# Patient Record
Sex: Female | Born: 1995 | Hispanic: No | Smoking: Former smoker
Health system: Southern US, Community
[De-identification: ages and names within clinical notes are randomized; demographics above are authoritative.]

## PROBLEM LIST (undated history)

## (undated) ENCOUNTER — Inpatient Hospital Stay (HOSPITAL_COMMUNITY): Payer: Self-pay

## (undated) DIAGNOSIS — J189 Pneumonia, unspecified organism: Secondary | ICD-10-CM

## (undated) DIAGNOSIS — R011 Cardiac murmur, unspecified: Secondary | ICD-10-CM

## (undated) DIAGNOSIS — I499 Cardiac arrhythmia, unspecified: Secondary | ICD-10-CM

## (undated) DIAGNOSIS — J302 Other seasonal allergic rhinitis: Secondary | ICD-10-CM

## (undated) HISTORY — PX: TYMPANOSTOMY TUBE PLACEMENT: SHX32

---
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
## 2014-01-04 ENCOUNTER — Ambulatory Visit (HOSPITAL_COMMUNITY): Payer: Medicaid Other

## 2014-01-11 ENCOUNTER — Ambulatory Visit (HOSPITAL_COMMUNITY)
Admission: RE | Admit: 2014-01-11 | Discharge: 2014-01-11 | Disposition: A | Discharge status: Home / Self Care | Payer: Medicaid Other | Source: Ambulatory Visit | Attending: Nurse Practitioner | Admitting: Nurse Practitioner

## 2014-01-11 VITALS — BP 114/58 | HR 97 | Wt 229.5 lb

## 2014-01-11 DIAGNOSIS — E669 Obesity, unspecified: Secondary | ICD-10-CM | POA: Insufficient documentation

## 2014-01-11 DIAGNOSIS — O289 Unspecified abnormal findings on antenatal screening of mother: Secondary | ICD-10-CM | POA: Diagnosis not present

## 2014-01-11 DIAGNOSIS — O350XX0 Maternal care for (suspected) central nervous system malformation in fetus, not applicable or unspecified: Secondary | ICD-10-CM | POA: Diagnosis not present

## 2014-01-11 DIAGNOSIS — O99210 Obesity complicating pregnancy, unspecified trimester: Secondary | ICD-10-CM

## 2014-01-11 DIAGNOSIS — Z3400 Encounter for supervision of normal first pregnancy, unspecified trimester: Secondary | ICD-10-CM

## 2014-01-11 DIAGNOSIS — O3500X0 Maternal care for (suspected) central nervous system malformation or damage in fetus, unspecified, not applicable or unspecified: Secondary | ICD-10-CM | POA: Insufficient documentation

## 2014-01-11 DIAGNOSIS — O280 Abnormal hematological finding on antenatal screening of mother: Secondary | ICD-10-CM

## 2014-01-25 ENCOUNTER — Encounter: Payer: Self-pay | Admitting: Obstetrics and Gynecology

## 2014-01-25 ENCOUNTER — Ambulatory Visit (INDEPENDENT_AMBULATORY_CARE_PROVIDER_SITE_OTHER): Payer: Medicaid Other | Admitting: Obstetrics and Gynecology

## 2014-01-25 VITALS — BP 109/66 | HR 100 | Temp 97.3°F | Wt 226.1 lb

## 2014-01-25 DIAGNOSIS — O4692 Antepartum hemorrhage, unspecified, second trimester: Secondary | ICD-10-CM | POA: Insufficient documentation

## 2014-01-25 DIAGNOSIS — O4690 Antepartum hemorrhage, unspecified, unspecified trimester: Secondary | ICD-10-CM

## 2014-01-25 NOTE — Patient Instructions (Signed)

## 2014-01-25 NOTE — Progress Notes (Signed)
Mild edema in feet.

## 2014-01-25 NOTE — Progress Notes (Signed)
US to F/U anatomy> scheduled for July. Hemorrhoids - asymptomatic and not constipated. Discussed and information given.

## 2014-01-26 NOTE — Addendum Note (Signed)
Encounter addended by: Danae Orleanseirdre C Poe, CNM on: 01/26/2014  5:04 PM<BR>     Documentation filed: Problem List

## 2014-02-22 ENCOUNTER — Ambulatory Visit (INDEPENDENT_AMBULATORY_CARE_PROVIDER_SITE_OTHER): Payer: Medicaid Other | Admitting: Obstetrics and Gynecology

## 2014-02-22 ENCOUNTER — Encounter: Payer: Self-pay | Admitting: Student

## 2014-02-22 VITALS — BP 119/75 | HR 105 | Temp 97.5°F | Wt 230.4 lb

## 2014-02-22 DIAGNOSIS — Z3400 Encounter for supervision of normal first pregnancy, unspecified trimester: Secondary | ICD-10-CM

## 2014-02-22 DIAGNOSIS — Z3402 Encounter for supervision of normal first pregnancy, second trimester: Secondary | ICD-10-CM

## 2014-02-22 DIAGNOSIS — Z23 Encounter for immunization: Secondary | ICD-10-CM

## 2014-02-22 LAB — POCT URINALYSIS DIP (DEVICE)
BILIRUBIN URINE: NEGATIVE
Bilirubin Urine: NEGATIVE
Glucose, UA: NEGATIVE mg/dL
Glucose, UA: NEGATIVE mg/dL
Hgb urine dipstick: NEGATIVE
Hgb urine dipstick: NEGATIVE
Ketones, ur: NEGATIVE mg/dL
LEUKOCYTES UA: NEGATIVE
NITRITE: NEGATIVE
Nitrite: NEGATIVE
PH: 6 (ref 5.0–8.0)
PH: 6 (ref 5.0–8.0)
PROTEIN: 30 mg/dL — AB
PROTEIN: 30 mg/dL — AB
Specific Gravity, Urine: 1.025 (ref 1.005–1.030)
Specific Gravity, Urine: 1.025 (ref 1.005–1.030)
UROBILINOGEN UA: 1 mg/dL (ref 0.0–1.0)
Urobilinogen, UA: 1 mg/dL (ref 0.0–1.0)

## 2014-02-22 LAB — CBC
HCT: 31.4 % — ABNORMAL LOW (ref 36.0–46.0)
Hemoglobin: 10.9 g/dL — ABNORMAL LOW (ref 12.0–15.0)
MCH: 28.9 pg (ref 26.0–34.0)
MCHC: 34.7 g/dL (ref 30.0–36.0)
MCV: 83.3 fL (ref 78.0–100.0)
PLATELETS: 226 10*3/uL (ref 150–400)
RBC: 3.77 MIL/uL — AB (ref 3.87–5.11)
RDW: 14.7 % (ref 11.5–15.5)
WBC: 10.6 10*3/uL — ABNORMAL HIGH (ref 4.0–10.5)

## 2014-02-22 LAB — RPR

## 2014-02-22 MED ORDER — TETANUS-DIPHTH-ACELL PERTUSSIS 5-2.5-18.5 LF-MCG/0.5 IM SUSP: 0.5000 mL | Freq: Once | INTRAMUSCULAR | Status: DC

## 2014-02-22 NOTE — Progress Notes (Signed)
Doing well. Obese and S>D. Has US scheduled for 7/29 for F/U anatomy and interval growth. 28 wk labs and tDap today. Has started PN classes. Mild RLP.

## 2014-02-22 NOTE — Patient Instructions (Signed)
Second Trimester of Pregnancy The second trimester is from week 13 through week 28, months 4 through 6. The second trimester is often a time when you feel your best. Your body has also adjusted to being pregnant, and you begin to feel better physically. Usually, morning sickness has lessened or quit completely, you may have more energy, and you may have an increase in appetite. The second trimester is also a time when the fetus is growing rapidly. At the end of the sixth month, the fetus is about 9 inches long and weighs about 1 pounds. You will likely begin to feel the baby move (quickening) between 18 and 20 weeks of the pregnancy. BODY CHANGES Your body goes through many changes during pregnancy. The changes vary from woman to woman.   Your weight will continue to increase. You will notice your lower abdomen bulging out.  You may begin to get stretch marks on your hips, abdomen, and breasts.  You may develop headaches that can be relieved by medicines approved by your health care provider.  You may urinate more often because the fetus is pressing on your bladder.  You may develop or continue to have heartburn as a result of your pregnancy.  You may develop constipation because certain hormones are causing the muscles that push waste through your intestines to slow down.  You may develop hemorrhoids or swollen, bulging veins (varicose veins).  You may have back pain because of the weight gain and pregnancy hormones relaxing your joints between the bones in your pelvis and as a result of a shift in weight and the muscles that support your balance.  Your breasts will continue to grow and be tender.  Your gums may bleed and may be sensitive to brushing and flossing.  Dark spots or blotches (chloasma, mask of pregnancy) may develop on your face. This will likely fade after the baby is born.  A dark line from your belly button to the pubic area (linea nigra) may appear. This will likely fade  after the baby is born.  You may have changes in your hair. These can include thickening of your hair, rapid growth, and changes in texture. Some women also have hair loss during or after pregnancy, or hair that feels dry or thin. Your hair will most likely return to normal after your baby is born. WHAT TO EXPECT AT YOUR PRENATAL VISITS During a routine prenatal visit:  You will be weighed to make sure you and the fetus are growing normally.  Your blood pressure will be taken.  Your abdomen will be measured to track your baby's growth.  The fetal heartbeat will be listened to.  Any test results from the previous visit will be discussed. Your health care provider may ask you:  How you are feeling.  If you are feeling the baby move.  If you have had any abnormal symptoms, such as leaking fluid, bleeding, severe headaches, or abdominal cramping.  If you have any questions. Other tests that may be performed during your second trimester include:  Blood tests that check for:  Low iron levels (anemia).  Gestational diabetes (between 24 and 28 weeks).  Rh antibodies.  Urine tests to check for infections, diabetes, or protein in the urine.  An ultrasound to confirm the proper growth and development of the baby.  An amniocentesis to check for possible genetic problems.  Fetal screens for spina bifida and Down syndrome. HOME CARE INSTRUCTIONS   Avoid all smoking, herbs, alcohol, and unprescribed   drugs. These chemicals affect the formation and growth of the baby.  Follow your health care provider's instructions regarding medicine use. There are medicines that are either safe or unsafe to take during pregnancy.  Exercise only as directed by your health care provider. Experiencing uterine cramps is a good sign to stop exercising.  Continue to eat regular, healthy meals.  Wear a good support bra for breast tenderness.  Do not use hot tubs, steam rooms, or saunas.  Wear your  seat belt at all times when driving.  Avoid raw meat, uncooked cheese, cat litter boxes, and soil used by cats. These carry germs that can cause birth defects in the baby.  Take your prenatal vitamins.  Try taking a stool softener (if your health care provider approves) if you develop constipation. Eat more high-fiber foods, such as fresh vegetables or fruit and whole grains. Drink plenty of fluids to keep your urine clear or pale yellow.  Take warm sitz baths to soothe any pain or discomfort caused by hemorrhoids. Use hemorrhoid cream if your health care provider approves.  If you develop varicose veins, wear support hose. Elevate your feet for 15 minutes, 3-4 times a day. Limit salt in your diet.  Avoid heavy lifting, wear low heel shoes, and practice good posture.  Rest with your legs elevated if you have leg cramps or low back pain.  Visit your dentist if you have not gone yet during your pregnancy. Use a soft toothbrush to brush your teeth and be gentle when you floss.  A sexual relationship may be continued unless your health care provider directs you otherwise.  Continue to go to all your prenatal visits as directed by your health care provider. SEEK MEDICAL CARE IF:   You have dizziness.  You have mild pelvic cramps, pelvic pressure, or nagging pain in the abdominal area.  You have persistent nausea, vomiting, or diarrhea.  You have a bad smelling vaginal discharge.  You have pain with urination. SEEK IMMEDIATE MEDICAL CARE IF:   You have a fever.  You are leaking fluid from your vagina.  You have spotting or bleeding from your vagina.  You have severe abdominal cramping or pain.  You have rapid weight gain or loss.  You have shortness of breath with chest pain.  You notice sudden or extreme swelling of your face, hands, ankles, feet, or legs.  You have not felt your baby move in over an hour.  You have severe headaches that do not go away with  medicine.  You have vision changes. Document Released: 07/22/2001 Document Revised: 08/02/2013 Document Reviewed: 09/28/2012 [employer] Patient Information 2015 [employer]. This information is not intended to replace advice given to you by your health care provider. Make sure you discuss any questions you have with your health care provider.  

## 2014-02-22 NOTE — Progress Notes (Signed)
Pressure-when moving 28 week labs and given packet

## 2014-02-23 LAB — HIV ANTIBODY (ROUTINE TESTING W REFLEX): HIV 1&2 Ab, 4th Generation: NONREACTIVE

## 2014-02-23 LAB — GLUCOSE TOLERANCE, 1 HOUR (50G) W/O FASTING: Glucose, 1 Hour GTT: 93 mg/dL (ref 70–140)

## 2014-03-08 ENCOUNTER — Encounter (HOSPITAL_COMMUNITY): Payer: Self-pay

## 2014-03-08 ENCOUNTER — Ambulatory Visit (HOSPITAL_COMMUNITY)
Admission: RE | Admit: 2014-03-08 | Discharge: 2014-03-08 | Disposition: A | Discharge status: Home / Self Care | Payer: Medicaid Other | Source: Ambulatory Visit | Attending: Advanced Practice Midwife | Admitting: Advanced Practice Midwife

## 2014-03-08 DIAGNOSIS — O99210 Obesity complicating pregnancy, unspecified trimester: Secondary | ICD-10-CM

## 2014-03-08 DIAGNOSIS — O289 Unspecified abnormal findings on antenatal screening of mother: Secondary | ICD-10-CM

## 2014-03-08 DIAGNOSIS — E669 Obesity, unspecified: Secondary | ICD-10-CM | POA: Insufficient documentation

## 2014-03-08 DIAGNOSIS — Z3689 Encounter for other specified antenatal screening: Secondary | ICD-10-CM | POA: Diagnosis not present

## 2014-03-09 ENCOUNTER — Ambulatory Visit (INDEPENDENT_AMBULATORY_CARE_PROVIDER_SITE_OTHER): Payer: Medicaid Other | Admitting: Obstetrics and Gynecology

## 2014-03-09 VITALS — BP 99/52 | HR 106 | Wt 233.7 lb

## 2014-03-09 DIAGNOSIS — O99210 Obesity complicating pregnancy, unspecified trimester: Secondary | ICD-10-CM

## 2014-03-09 DIAGNOSIS — E669 Obesity, unspecified: Secondary | ICD-10-CM

## 2014-03-09 DIAGNOSIS — R799 Abnormal finding of blood chemistry, unspecified: Secondary | ICD-10-CM

## 2014-03-09 DIAGNOSIS — R772 Abnormality of alphafetoprotein: Secondary | ICD-10-CM

## 2014-03-09 LAB — POCT URINALYSIS DIP (DEVICE)
Bilirubin Urine: NEGATIVE
GLUCOSE, UA: NEGATIVE mg/dL
Hgb urine dipstick: NEGATIVE
Ketones, ur: NEGATIVE mg/dL
LEUKOCYTES UA: NEGATIVE
NITRITE: NEGATIVE
PROTEIN: NEGATIVE mg/dL
Specific Gravity, Urine: 1.015 (ref 1.005–1.030)
UROBILINOGEN UA: 0.2 mg/dL (ref 0.0–1.0)
pH: 8.5 — ABNORMAL HIGH (ref 5.0–8.0)

## 2014-03-09 NOTE — Progress Notes (Signed)
US: 44th at 30 wks, AFI 21.5. Has F/U scheduled 9/23. RLP discussed. Plans Nexplanon, breastfeed.

## 2014-03-09 NOTE — Patient Instructions (Signed)

## 2014-03-17 ENCOUNTER — Encounter (HOSPITAL_COMMUNITY): Payer: Self-pay | Admitting: *Deleted

## 2014-03-17 ENCOUNTER — Inpatient Hospital Stay (HOSPITAL_COMMUNITY)
Admission: AD | Admit: 2014-03-17 | Discharge: 2014-03-18 | Disposition: A | Discharge status: Home / Self Care | Payer: Medicaid Other | Source: Ambulatory Visit | Attending: Obstetrics & Gynecology | Admitting: Obstetrics & Gynecology

## 2014-03-17 DIAGNOSIS — Z3402 Encounter for supervision of normal first pregnancy, second trimester: Secondary | ICD-10-CM

## 2014-03-17 DIAGNOSIS — O368131 Decreased fetal movements, third trimester, fetus 1: Secondary | ICD-10-CM

## 2014-03-17 DIAGNOSIS — O368190 Decreased fetal movements, unspecified trimester, not applicable or unspecified: Secondary | ICD-10-CM | POA: Insufficient documentation

## 2014-03-17 DIAGNOSIS — Z87891 Personal history of nicotine dependence: Secondary | ICD-10-CM | POA: Insufficient documentation

## 2014-03-17 NOTE — MAU Note (Signed)
Pt reports no fetal movement since this am.  

## 2014-03-18 DIAGNOSIS — Z87891 Personal history of nicotine dependence: Secondary | ICD-10-CM | POA: Diagnosis not present

## 2014-03-18 DIAGNOSIS — O368190 Decreased fetal movements, unspecified trimester, not applicable or unspecified: Secondary | ICD-10-CM

## 2014-03-18 NOTE — MAU Provider Note (Signed)
  History     CSN: [id number]  Arrival date and time: 03/17/14 2341   None     Chief Complaint  Patient presents with  . Decreased Fetal Movement   HPI  18 year old G2P0010 at 11059w2d gestation who presents for reduced fetal movement. She is used to fetal movement every hour. At 2130 hours she noticed that baby was not moving much and came to the hospital after 2 hours of no movement. No vaginal bleeding, no discharge, no leakage of fluid. No contractions. She reports that she feels baby moving now that she is on the monitor. No other complaints    Past Medical History  Diagnosis Date  . Seasonal allergies   . Arrhythmia     Past Surgical History  Procedure Laterality Date  . Tympanostomy tube placement      Family History  Problem Relation Age of Onset  . Diabetes Maternal Grandfather   . Cancer Paternal Grandmother   . Diabetes Paternal Grandfather     History  Substance Use Topics  . Smoking status: Former Smoker    Types: Cigarettes    Quit date: 11/02/2010  . Smokeless tobacco: Never Used  . Alcohol Use: No    Allergies: No Known Allergies  Facility-administered medications prior to admission  Medication Dose Route Frequency Provider Last Rate Last Dose  . Tdap (BOOSTRIX) injection 0.5 mL  0.5 mL Intramuscular Once Danae Orleanseirdre C Poe, CNM       Prescriptions prior to admission  Medication Sig Dispense Refill  . Prenatal Vit-Fe Fumarate-FA (PRENATAL MULTIVITAMIN) TABS Take 1 tablet by mouth every morning.      . Doxylamine-Pyridoxine 10-10 MG TBEC Take 1 tablet by mouth 2 (two) times daily.  60 tablet  1    Review of Systems  Constitutional: Negative for fever and chills.  HENT: Negative for hearing loss.   Eyes: Negative for blurred vision and double vision.  Respiratory: Negative for cough and shortness of breath.   Cardiovascular: Negative for chest pain and palpitations.  Gastrointestinal: Negative for heartburn and nausea.  Genitourinary: Negative for  dysuria and urgency.  Musculoskeletal: Negative for myalgias.  Skin: Negative for itching and rash.  Neurological: Negative for dizziness, tingling and headaches.   Physical Exam   Last menstrual period 08/18/2013.  Physical Exam  Constitutional: She is oriented to person, place, and time. She appears well-developed and well-nourished.  Cardiovascular: Normal rate and regular rhythm.   Respiratory: Breath sounds normal.  GI: Soft. Bowel sounds are normal.  Gravid, non-tender  Musculoskeletal: She exhibits no edema.  Neurological: She is alert and oriented to person, place, and time.  Skin: Skin is warm.  Psychiatric: She has a normal mood and affect. Her behavior is normal.    MAU Course  Procedures  MDM Fetal monitoring to check for reactivity.  Assessment and Plan  18 year old at 4659w2d gestation with decreased fetal movement, now resolved. FHR reactive, no contractions, no signs of labor. Discharge home with instructions to count kicks and return if any signs of labor or loss of movement.  Patient evaluated with Francene FindersKimberly Damichael Hofman  Stephens, Devin A 03/18/2014, 12:11 AM   I have participated in the care of this patient and I agree with the above. Cam HaiSHAW, Cedric Mcclaine CNM 8:49 AM 03/18/2014

## 2014-03-18 NOTE — MAU Note (Signed)
Audible fetal movement while RN at bedside and pt states she feels the baby moving.

## 2014-03-18 NOTE — Discharge Instructions (Signed)

## 2014-03-19 NOTE — MAU Provider Note (Signed)
Attestation of Attending Supervision of Advanced Practitioner (CNM/NP): Evaluation and management procedures were performed by the Advanced Practitioner under my supervision and collaboration. I have reviewed the Advanced Practitioner's note and chart, and I agree with the management and plan.  Anterio Scheel H. 11:13 AM

## 2014-03-29 ENCOUNTER — Ambulatory Visit (INDEPENDENT_AMBULATORY_CARE_PROVIDER_SITE_OTHER): Payer: Medicaid Other | Admitting: Advanced Practice Midwife

## 2014-03-29 ENCOUNTER — Encounter: Payer: Self-pay | Admitting: Advanced Practice Midwife

## 2014-03-29 VITALS — BP 96/58 | HR 97 | Wt 236.3 lb

## 2014-03-29 DIAGNOSIS — Z3400 Encounter for supervision of normal first pregnancy, unspecified trimester: Secondary | ICD-10-CM

## 2014-03-29 DIAGNOSIS — Z3403 Encounter for supervision of normal first pregnancy, third trimester: Secondary | ICD-10-CM

## 2014-03-29 LAB — POCT URINALYSIS DIP (DEVICE)
BILIRUBIN URINE: NEGATIVE
GLUCOSE, UA: NEGATIVE mg/dL
Hgb urine dipstick: NEGATIVE
Ketones, ur: NEGATIVE mg/dL
LEUKOCYTES UA: NEGATIVE
NITRITE: NEGATIVE
Protein, ur: NEGATIVE mg/dL
Specific Gravity, Urine: 1.01 (ref 1.005–1.030)
Urobilinogen, UA: 0.2 mg/dL (ref 0.0–1.0)
pH: 7 (ref 5.0–8.0)

## 2014-03-29 NOTE — Patient Instructions (Signed)
Benadryl topical or pills.   Rash A rash is a change in the color or texture of your skin. There are many different types of rashes. You may have other problems that accompany your rash. CAUSES   Infections.  Allergic reactions. This can include allergies to pets or foods.  Certain medicines.  Exposure to certain chemicals, soaps, or cosmetics.  Heat.  Exposure to poisonous plants.  Tumors, both cancerous and noncancerous. SYMPTOMS   Redness.  Scaly skin.  Itchy skin.  Dry or cracked skin.  Bumps.  Blisters.  Pain. DIAGNOSIS  Your caregiver may do a physical exam to determine what type of rash you have. A skin sample (biopsy) may be taken and examined under a microscope. TREATMENT  Treatment depends on the type of rash you have. Your caregiver may prescribe certain medicines. For serious conditions, you may need to see a skin doctor (dermatologist). HOME CARE INSTRUCTIONS   Avoid the substance that caused your rash.  Do not scratch your rash. This can cause infection.  You may take cool baths to help stop itching.  Only take over-the-counter or prescription medicines as directed by your caregiver.  Keep all follow-up appointments as directed by your caregiver. SEEK IMMEDIATE MEDICAL CARE IF:  You have increasing pain, swelling, or redness.  You have a fever.  You have new or severe symptoms.  You have body aches, diarrhea, or vomiting.  Your rash is not better after 3 days. MAKE SURE YOU:  Understand these instructions.  Will watch your condition.  Will get help right away if you are not doing well or get worse. Document Released: 07/18/2002 Document Revised: 10/20/2011 Document Reviewed: 05/12/2011 [facility] Patient Information 2015 [place], [place]. This information is not intended to replace advice given to you by your health care provider. Make sure you discuss any questions you have with your health care provider.  Third Trimester of  Pregnancy The third trimester is from week 29 through week 42, months 7 through 9. The third trimester is a time when the fetus is growing rapidly. At the end of the ninth month, the fetus is about 20 inches in length and weighs 6-10 pounds.  BODY CHANGES Your body goes through many changes during pregnancy. The changes vary from woman to woman.   Your weight will continue to increase. You can expect to gain 25-35 pounds (11-16 kg) by the end of the pregnancy.  You may begin to get stretch marks on your hips, abdomen, and breasts.  You may urinate more often because the fetus is moving lower into your pelvis and pressing on your bladder.  You may develop or continue to have heartburn as a result of your pregnancy.  You may develop constipation because certain hormones are causing the muscles that push waste through your intestines to slow down.  You may develop hemorrhoids or swollen, bulging veins (varicose veins).  You may have pelvic pain because of the weight gain and pregnancy hormones relaxing your joints between the bones in your pelvis. Backaches may result from overexertion of the muscles supporting your posture.  You may have changes in your hair. These can include thickening of your hair, rapid growth, and changes in texture. Some women also have hair loss during or after pregnancy, or hair that feels dry or thin. Your hair will most likely return to normal after your baby is born.  Your breasts will continue to grow and be tender. A yellow discharge may leak from your breasts called colostrum.  Your belly button may stick out.  You may feel short of breath because of your expanding uterus.  You may notice the fetus "dropping," or moving lower in your abdomen.  You may have a bloody mucus discharge. This usually occurs a few days to a week before labor begins.  Your cervix becomes thin and soft (effaced) near your due date. WHAT TO EXPECT AT YOUR PRENATAL EXAMS  You will  have prenatal exams every 2 weeks until week 36. Then, you will have weekly prenatal exams. During a routine prenatal visit:  You will be weighed to make sure you and the fetus are growing normally.  Your blood pressure is taken.  Your abdomen will be measured to track your baby's growth.  The fetal heartbeat will be listened to.  Any test results from the previous visit will be discussed.  You may have a cervical check near your due date to see if you have effaced. At around 36 weeks, your caregiver will check your cervix. At the same time, your caregiver will also perform a test on the secretions of the vaginal tissue. This test is to determine if a type of bacteria, Group B streptococcus, is present. Your caregiver will explain this further. Your caregiver may ask you:  What your birth plan is.  How you are feeling.  If you are feeling the baby move.  If you have had any abnormal symptoms, such as leaking fluid, bleeding, severe headaches, or abdominal cramping.  If you have any questions. Other tests or screenings that may be performed during your third trimester include:  Blood tests that check for low iron levels (anemia).  Fetal testing to check the health, activity level, and growth of the fetus. Testing is done if you have certain medical conditions or if there are problems during the pregnancy. FALSE LABOR You may feel small, irregular contractions that eventually go away. These are called Braxton Hicks contractions, or false labor. Contractions may last for hours, days, or even weeks before true labor sets in. If contractions come at regular intervals, intensify, or become painful, it is best to be seen by your caregiver.  SIGNS OF LABOR   Menstrual-like cramps.  Contractions that are 5 minutes apart or less.  Contractions that start on the top of the uterus and spread down to the lower abdomen and back.  A sense of increased pelvic pressure or back pain.  A  watery or bloody mucus discharge that comes from the vagina. If you have any of these signs before the 37th week of pregnancy, call your caregiver right away. You need to go to the hospital to get checked immediately. HOME CARE INSTRUCTIONS   Avoid all smoking, herbs, alcohol, and unprescribed drugs. These chemicals affect the formation and growth of the baby.  Follow your caregiver's instructions regarding medicine use. There are medicines that are either safe or unsafe to take during pregnancy.  Exercise only as directed by your caregiver. Experiencing uterine cramps is a good sign to stop exercising.  Continue to eat regular, healthy meals.  Wear a good support bra for breast tenderness.  Do not use hot tubs, steam rooms, or saunas.  Wear your seat belt at all times when driving.  Avoid raw meat, uncooked cheese, cat litter boxes, and soil used by cats. These carry germs that can cause birth defects in the baby.  Take your prenatal vitamins.  Try taking a stool softener (if your caregiver approves) if you develop constipation. Eat more  high-fiber foods, such as fresh vegetables or fruit and whole grains. Drink plenty of fluids to keep your urine clear or pale yellow.  Take warm sitz baths to soothe any pain or discomfort caused by hemorrhoids. Use hemorrhoid cream if your caregiver approves.  If you develop varicose veins, wear support hose. Elevate your feet for 15 minutes, 3-4 times a day. Limit salt in your diet.  Avoid heavy lifting, wear low heal shoes, and practice good posture.  Rest a lot with your legs elevated if you have leg cramps or low back pain.  Visit your dentist if you have not gone during your pregnancy. Use a soft toothbrush to brush your teeth and be gentle when you floss.  A sexual relationship may be continued unless your caregiver directs you otherwise.  Do not travel far distances unless it is absolutely necessary and only with the approval of your  caregiver.  Take prenatal classes to understand, practice, and ask questions about the labor and delivery.  Make a trial run to the hospital.  Pack your hospital bag.  Prepare the baby's nursery.  Continue to go to all your prenatal visits as directed by your caregiver. SEEK MEDICAL CARE IF:  You are unsure if you are in labor or if your water has broken.  You have dizziness.  You have mild pelvic cramps, pelvic pressure, or nagging pain in your abdominal area.  You have persistent nausea, vomiting, or diarrhea.  You have a bad smelling vaginal discharge.  You have pain with urination. SEEK IMMEDIATE MEDICAL CARE IF:   You have a fever.  You are leaking fluid from your vagina.  You have spotting or bleeding from your vagina.  You have severe abdominal cramping or pain.  You have rapid weight loss or gain.  You have shortness of breath with chest pain.  You notice sudden or extreme swelling of your face, hands, ankles, feet, or legs.  You have not felt your baby move in over an hour.  You have severe headaches that do not go away with medicine.  You have vision changes. Document Released: 07/22/2001 Document Revised: 08/02/2013 Document Reviewed: 09/28/2012 [facility] Patient Information 2015 [place], [place]. This information is not intended to replace advice given to you by your health care provider. Make sure you discuss any questions you have with your health care provider.

## 2014-03-29 NOTE — Progress Notes (Signed)
Active baby. Enc to choose peds.

## 2014-04-05 ENCOUNTER — Encounter: Payer: Self-pay | Admitting: General Practice

## 2014-04-26 ENCOUNTER — Other Ambulatory Visit: Payer: Self-pay | Admitting: Advanced Practice Midwife

## 2014-04-26 ENCOUNTER — Ambulatory Visit (INDEPENDENT_AMBULATORY_CARE_PROVIDER_SITE_OTHER): Payer: Medicaid Other | Admitting: Advanced Practice Midwife

## 2014-04-26 VITALS — BP 124/71 | HR 103 | Temp 98.4°F | Wt 239.3 lb

## 2014-04-26 DIAGNOSIS — Z3403 Encounter for supervision of normal first pregnancy, third trimester: Secondary | ICD-10-CM

## 2014-04-26 DIAGNOSIS — E669 Obesity, unspecified: Secondary | ICD-10-CM

## 2014-04-26 DIAGNOSIS — Z3400 Encounter for supervision of normal first pregnancy, unspecified trimester: Secondary | ICD-10-CM

## 2014-04-26 DIAGNOSIS — Z23 Encounter for immunization: Secondary | ICD-10-CM

## 2014-04-26 LAB — POCT URINALYSIS DIP (DEVICE)
BILIRUBIN URINE: NEGATIVE
Glucose, UA: NEGATIVE mg/dL
KETONES UR: NEGATIVE mg/dL
Nitrite: NEGATIVE
Protein, ur: 30 mg/dL — AB
Specific Gravity, Urine: 1.02 (ref 1.005–1.030)
Urobilinogen, UA: 1 mg/dL (ref 0.0–1.0)
pH: 7 (ref 5.0–8.0)

## 2014-04-26 LAB — OB RESULTS CONSOLE GC/CHLAMYDIA
Chlamydia: NEGATIVE
Gonorrhea: NEGATIVE

## 2014-04-26 LAB — OB RESULTS CONSOLE GBS: GBS: NEGATIVE

## 2014-04-26 NOTE — Progress Notes (Signed)
Patient reports pelvic pain and some contractions that are painful at times

## 2014-04-26 NOTE — Patient Instructions (Signed)
Braxton Hicks Contractions Contractions of the uterus can occur throughout pregnancy. Contractions are not always a sign that you are in labor.  WHAT ARE BRAXTON HICKS CONTRACTIONS?  Contractions that occur before labor are called Braxton Hicks contractions, or false labor. Toward the end of pregnancy (32-34 weeks), these contractions can develop more often and may become more forceful. This is not true labor because these contractions do not result in opening (dilatation) and thinning of the cervix. They are sometimes difficult to tell apart from true labor because these contractions can be forceful and people have different pain tolerances. You should not feel embarrassed if you go to the hospital with false labor. Sometimes, the only way to tell if you are in true labor is for your health care provider to look for changes in the cervix. If there are no prenatal problems or other health problems associated with the pregnancy, it is completely safe to be sent home with false labor and await the onset of true labor. HOW CAN YOU TELL THE DIFFERENCE BETWEEN TRUE AND FALSE LABOR? False Labor  The contractions of false labor are usually shorter and not as hard as those of true labor.   The contractions are usually irregular.   The contractions are often felt in the front of the lower abdomen and in the groin.   The contractions may go away when you walk around or change positions while lying down.   The contractions get weaker and are shorter lasting as time goes on.   The contractions do not usually become progressively stronger, regular, and closer together as with true labor.  True Labor  Contractions in true labor last 30-70 seconds, become very regular, usually become more intense, and increase in frequency.   The contractions do not go away with walking.   The discomfort is usually felt in the top of the uterus and spreads to the lower abdomen and low back.   True labor can be  determined by your health care provider with an exam. This will show that the cervix is dilating and getting thinner.  WHAT TO REMEMBER  Keep up with your usual exercises and follow other instructions given by your health care provider.   Take medicines as directed by your health care provider.   Keep your regular prenatal appointments.   Eat and drink lightly if you think you are going into labor.   If Braxton Hicks contractions are making you uncomfortable:   Change your position from lying down or resting to walking, or from walking to resting.   Sit and rest in a tub of warm water.   Drink 2-3 glasses of water. Dehydration may cause these contractions.   Do slow and deep breathing several times an hour.  WHEN SHOULD I SEEK IMMEDIATE MEDICAL CARE? Seek immediate medical care if:  Your contractions become stronger, more regular, and closer together.   You have fluid leaking or gushing from your vagina.   You have a fever.   You pass blood-tinged mucus.   You have vaginal bleeding.   You have continuous abdominal pain.   You have low back pain that you never had before.   You feel your baby's head pushing down and causing pelvic pressure.   Your baby is not moving as much as it used to.  Document Released: 07/28/2005 Document Revised: 08/02/2013 Document Reviewed: 05/09/2013 [employer] Patient Information 2015 [employer]. This information is not intended to replace advice given to you by your health care   provider. Make sure you discuss any questions you have with your health care provider.  Fetal Movement Counts Patient Name: __________________________________________________ Patient Due Date: ____________________ Performing a fetal movement count is highly recommended in high-risk pregnancies, but it is good for every pregnant woman to do. Your health care provider may ask you to start counting fetal movements at 28 weeks of the pregnancy. Fetal  movements often increase:  After eating a full meal.  After physical activity.  After eating or drinking something sweet or cold.  At rest. Pay attention to when you feel the baby is most active. This will help you notice a pattern of your baby's sleep and wake cycles and what factors contribute to an increase in fetal movement. It is important to perform a fetal movement count at the same time each day when your baby is normally most active.  HOW TO COUNT FETAL MOVEMENTS 1. Find a quiet and comfortable area to sit or lie down on your left side. Lying on your left side provides the best blood and oxygen circulation to your baby. 2. Write down the day and time on a sheet of paper or in a journal. 3. Start counting kicks, flutters, swishes, rolls, or jabs in a 2-hour period. You should feel at least 10 movements within 2 hours. 4. If you do not feel 10 movements in 2 hours, wait 2-3 hours and count again. Look for a change in the pattern or not enough counts in 2 hours. SEEK MEDICAL CARE IF:  You feel less than 10 counts in 2 hours, tried twice.  There is no movement in over an hour.  The pattern is changing or taking longer each day to reach 10 counts in 2 hours.  You feel the baby is not moving as he or she usually does. Date: ____________ Movements: ____________ Start time: ____________ Finish time: ____________  Date: ____________ Movements: ____________ Start time: ____________ Finish time: ____________ Date: ____________ Movements: ____________ Start time: ____________ Finish time: ____________ Date: ____________ Movements: ____________ Start time: ____________ Finish time: ____________ Date: ____________ Movements: ____________ Start time: ____________ Finish time: ____________ Date: ____________ Movements: ____________ Start time: ____________ Finish time: ____________ Date: ____________ Movements: ____________ Start time: ____________ Finish time: ____________ Date: ____________  Movements: ____________ Start time: ____________ Finish time: ____________  Date: ____________ Movements: ____________ Start time: ____________ Finish time: ____________ Date: ____________ Movements: ____________ Start time: ____________ Finish time: ____________ Date: ____________ Movements: ____________ Start time: ____________ Finish time: ____________ Date: ____________ Movements: ____________ Start time: ____________ Finish time: ____________ Date: ____________ Movements: ____________ Start time: ____________ Finish time: ____________ Date: ____________ Movements: ____________ Start time: ____________ Finish time: ____________ Date: ____________ Movements: ____________ Start time: ____________ Finish time: ____________  Date: ____________ Movements: ____________ Start time: ____________ Finish time: ____________ Date: ____________ Movements: ____________ Start time: ____________ Finish time: ____________ Date: ____________ Movements: ____________ Start time: ____________ Finish time: ____________ Date: ____________ Movements: ____________ Start time: ____________ Finish time: ____________ Date: ____________ Movements: ____________ Start time: ____________ Finish time: ____________ Date: ____________ Movements: ____________ Start time: ____________ Finish time: ____________ Date: ____________ Movements: ____________ Start time: ____________ Finish time: ____________  Date: ____________ Movements: ____________ Start time: ____________ Finish time: ____________ Date: ____________ Movements: ____________ Start time: ____________ Finish time: ____________ Date: ____________ Movements: ____________ Start time: ____________ Finish time: ____________ Date: ____________ Movements: ____________ Start time: ____________ Finish time: ____________ Date: ____________ Movements: ____________ Start time: ____________ Finish time: ____________ Date: ____________ Movements: ____________ Start time:  ____________ Finish time: ____________ Date: ____________ Movements:   ____________ Start time: ____________ Finish time: ____________  Date: ____________ Movements: ____________ Start time: ____________ Finish time: ____________ Date: ____________ Movements: ____________ Start time: ____________ Finish time: ____________ Date: ____________ Movements: ____________ Start time: ____________ Finish time: ____________ Date: ____________ Movements: ____________ Start time: ____________ Finish time: ____________ Date: ____________ Movements: ____________ Start time: ____________ Finish time: ____________ Date: ____________ Movements: ____________ Start time: ____________ Finish time: ____________ Date: ____________ Movements: ____________ Start time: ____________ Finish time: ____________  Date: ____________ Movements: ____________ Start time: ____________ Finish time: ____________ Date: ____________ Movements: ____________ Start time: ____________ Finish time: ____________ Date: ____________ Movements: ____________ Start time: ____________ Finish time: ____________ Date: ____________ Movements: ____________ Start time: ____________ Finish time: ____________ Date: ____________ Movements: ____________ Start time: ____________ Finish time: ____________ Date: ____________ Movements: ____________ Start time: ____________ Finish time: ____________ Date: ____________ Movements: ____________ Start time: ____________ Finish time: ____________  Date: ____________ Movements: ____________ Start time: ____________ Finish time: ____________ Date: ____________ Movements: ____________ Start time: ____________ Finish time: ____________ Date: ____________ Movements: ____________ Start time: ____________ Finish time: ____________ Date: ____________ Movements: ____________ Start time: ____________ Finish time: ____________ Date: ____________ Movements: ____________ Start time: ____________ Finish time: ____________ Date:  ____________ Movements: ____________ Start time: ____________ Finish time: ____________ Date: ____________ Movements: ____________ Start time: ____________ Finish time: ____________  Date: ____________ Movements: ____________ Start time: ____________ Finish time: ____________ Date: ____________ Movements: ____________ Start time: ____________ Finish time: ____________ Date: ____________ Movements: ____________ Start time: ____________ Finish time: ____________ Date: ____________ Movements: ____________ Start time: ____________ Finish time: ____________ Date: ____________ Movements: ____________ Start time: ____________ Finish time: ____________ Date: ____________ Movements: ____________ Start time: ____________ Finish time: ____________ Document Released: 08/27/2006 Document Revised: 12/12/2013 Document Reviewed: 05/24/2012 [employer] Patient Information 2015 [employer]. This information is not intended to replace advice given to you by your health care provider. Make sure you discuss any questions you have with your health care provider.  

## 2014-04-26 NOTE — Progress Notes (Signed)
GBS, CG/Chl, Growth [place] next week.

## 2014-04-27 LAB — GC/CHLAMYDIA PROBE AMP
CT Probe RNA: NEGATIVE
GC Probe RNA: NEGATIVE

## 2014-04-28 ENCOUNTER — Other Ambulatory Visit: Payer: Self-pay | Admitting: Obstetrics and Gynecology

## 2014-04-28 DIAGNOSIS — E669 Obesity, unspecified: Secondary | ICD-10-CM

## 2014-04-28 DIAGNOSIS — O99210 Obesity complicating pregnancy, unspecified trimester: Secondary | ICD-10-CM

## 2014-04-28 DIAGNOSIS — O350XX1 Maternal care for (suspected) central nervous system malformation in fetus, fetus 1: Secondary | ICD-10-CM

## 2014-04-28 DIAGNOSIS — O289 Unspecified abnormal findings on antenatal screening of mother: Secondary | ICD-10-CM

## 2014-04-28 DIAGNOSIS — O3500X1 Maternal care for (suspected) central nervous system malformation or damage in fetus, unspecified, fetus 1: Secondary | ICD-10-CM

## 2014-04-28 LAB — CULTURE, BETA STREP (GROUP B ONLY)

## 2014-04-30 ENCOUNTER — Encounter: Payer: Self-pay | Admitting: Advanced Practice Midwife

## 2014-05-03 ENCOUNTER — Ambulatory Visit (HOSPITAL_COMMUNITY)
Admission: RE | Admit: 2014-05-03 | Discharge: 2014-05-03 | Disposition: A | Discharge status: Home / Self Care | Payer: Medicaid Other | Source: Ambulatory Visit | Attending: Advanced Practice Midwife | Admitting: Advanced Practice Midwife

## 2014-05-03 VITALS — BP 113/55 | HR 101 | Wt 243.0 lb

## 2014-05-03 DIAGNOSIS — O289 Unspecified abnormal findings on antenatal screening of mother: Secondary | ICD-10-CM | POA: Insufficient documentation

## 2014-05-03 DIAGNOSIS — E669 Obesity, unspecified: Secondary | ICD-10-CM | POA: Diagnosis not present

## 2014-05-03 DIAGNOSIS — O350XX1 Maternal care for (suspected) central nervous system malformation in fetus, fetus 1: Secondary | ICD-10-CM

## 2014-05-03 DIAGNOSIS — O3500X0 Maternal care for (suspected) central nervous system malformation or damage in fetus, unspecified, not applicable or unspecified: Secondary | ICD-10-CM | POA: Insufficient documentation

## 2014-05-03 DIAGNOSIS — O350XX0 Maternal care for (suspected) central nervous system malformation in fetus, not applicable or unspecified: Secondary | ICD-10-CM | POA: Diagnosis not present

## 2014-05-03 DIAGNOSIS — O99210 Obesity complicating pregnancy, unspecified trimester: Secondary | ICD-10-CM | POA: Diagnosis not present

## 2014-05-03 DIAGNOSIS — O99213 Obesity complicating pregnancy, third trimester: Secondary | ICD-10-CM

## 2014-05-03 DIAGNOSIS — O3500X1 Maternal care for (suspected) central nervous system malformation or damage in fetus, unspecified, fetus 1: Secondary | ICD-10-CM

## 2014-05-05 ENCOUNTER — Ambulatory Visit (INDEPENDENT_AMBULATORY_CARE_PROVIDER_SITE_OTHER): Payer: Self-pay | Admitting: Physician Assistant

## 2014-05-05 ENCOUNTER — Encounter: Payer: Self-pay | Admitting: Physician Assistant

## 2014-05-05 VITALS — BP 117/62 | HR 99 | Temp 98.0°F | Wt 243.2 lb

## 2014-05-05 DIAGNOSIS — Z3400 Encounter for supervision of normal first pregnancy, unspecified trimester: Secondary | ICD-10-CM

## 2014-05-05 DIAGNOSIS — Z3403 Encounter for supervision of normal first pregnancy, third trimester: Secondary | ICD-10-CM

## 2014-05-05 LAB — POCT URINALYSIS DIP (DEVICE)
BILIRUBIN URINE: NEGATIVE
GLUCOSE, UA: NEGATIVE mg/dL
Ketones, ur: NEGATIVE mg/dL
Nitrite: NEGATIVE
PH: 7 (ref 5.0–8.0)
Protein, ur: NEGATIVE mg/dL
SPECIFIC GRAVITY, URINE: 1.015 (ref 1.005–1.030)
Urobilinogen, UA: 0.2 mg/dL (ref 0.0–1.0)

## 2014-05-05 NOTE — Progress Notes (Signed)
C/o edema worsening over last week in feet/legs/ hands/ face. Denies headaches/ visual changes.

## 2014-05-05 NOTE — Progress Notes (Signed)
37 weeks with increase in swelling.  Endorses good fetal movement.  Complaining of back pain and fluid leaking down legs (twice this am).  No contractions or vaginal bleeding.  Needs note for work to sit down.  Speculum exam done and no pooling visualized.  Valsalva maneuver also produces no pooling of fluid. Labor precautions RTC 1 week

## 2014-05-05 NOTE — Patient Instructions (Signed)
Third Trimester of Pregnancy The third trimester is from week 29 through week 42, months 7 through 9. The third trimester is a time when the fetus is growing rapidly. At the end of the ninth month, the fetus is about 20 inches in length and weighs 6-10 pounds.  BODY CHANGES Your body goes through many changes during pregnancy. The changes vary from woman to woman.   Your weight will continue to increase. You can expect to gain 25-35 pounds (11-16 kg) by the end of the pregnancy.  You may begin to get stretch marks on your hips, abdomen, and breasts.  You may urinate more often because the fetus is moving lower into your pelvis and pressing on your bladder.  You may develop or continue to have heartburn as a result of your pregnancy.  You may develop constipation because certain hormones are causing the muscles that push waste through your intestines to slow down.  You may develop hemorrhoids or swollen, bulging veins (varicose veins).  You may have pelvic pain because of the weight gain and pregnancy hormones relaxing your joints between the bones in your pelvis. Backaches may result from overexertion of the muscles supporting your posture.  You may have changes in your hair. These can include thickening of your hair, rapid growth, and changes in texture. Some women also have hair loss during or after pregnancy, or hair that feels dry or thin. Your hair will most likely return to normal after your baby is born.  Your breasts will continue to grow and be tender. A yellow discharge may leak from your breasts called colostrum.  Your belly button may stick out.  You may feel short of breath because of your expanding uterus.  You may notice the fetus "dropping," or moving lower in your abdomen.  You may have a bloody mucus discharge. This usually occurs a few days to a week before labor begins.  Your cervix becomes thin and soft (effaced) near your due date. WHAT TO EXPECT AT YOUR PRENATAL  EXAMS  You will have prenatal exams every 2 weeks until week 36. Then, you will have weekly prenatal exams. During a routine prenatal visit:  You will be weighed to make sure you and the fetus are growing normally.  Your blood pressure is taken.  Your abdomen will be measured to track your baby's growth.  The fetal heartbeat will be listened to.  Any test results from the previous visit will be discussed.  You may have a cervical check near your due date to see if you have effaced. At around 36 weeks, your caregiver will check your cervix. At the same time, your caregiver will also perform a test on the secretions of the vaginal tissue. This test is to determine if a type of bacteria, Group B streptococcus, is present. Your caregiver will explain this further. Your caregiver may ask you:  What your birth plan is.  How you are feeling.  If you are feeling the baby move.  If you have had any abnormal symptoms, such as leaking fluid, bleeding, severe headaches, or abdominal cramping.  If you have any questions. Other tests or screenings that may be performed during your third trimester include:  Blood tests that check for low iron levels (anemia).  Fetal testing to check the health, activity level, and growth of the fetus. Testing is done if you have certain medical conditions or if there are problems during the pregnancy. FALSE LABOR You may feel small, irregular contractions that   eventually go away. These are called Braxton Hicks contractions, or false labor. Contractions may last for hours, days, or even weeks before true labor sets in. If contractions come at regular intervals, intensify, or become painful, it is best to be seen by your caregiver.  SIGNS OF LABOR   Menstrual-like cramps.  Contractions that are 5 minutes apart or less.  Contractions that start on the top of the uterus and spread down to the lower abdomen and back.  A sense of increased pelvic pressure or back  pain.  A watery or bloody mucus discharge that comes from the vagina. If you have any of these signs before the 37th week of pregnancy, call your caregiver right away. You need to go to the hospital to get checked immediately. HOME CARE INSTRUCTIONS   Avoid all smoking, herbs, alcohol, and unprescribed drugs. These chemicals affect the formation and growth of the baby.  Follow your caregiver's instructions regarding medicine use. There are medicines that are either safe or unsafe to take during pregnancy.  Exercise only as directed by your caregiver. Experiencing uterine cramps is a good sign to stop exercising.  Continue to eat regular, healthy meals.  Wear a good support bra for breast tenderness.  Do not use hot tubs, steam rooms, or saunas.  Wear your seat belt at all times when driving.  Avoid raw meat, uncooked cheese, cat litter boxes, and soil used by cats. These carry germs that can cause birth defects in the baby.  Take your prenatal vitamins.  Try taking a stool softener (if your caregiver approves) if you develop constipation. Eat more high-fiber foods, such as fresh vegetables or fruit and whole grains. Drink plenty of fluids to keep your urine clear or pale yellow.  Take warm sitz baths to soothe any pain or discomfort caused by hemorrhoids. Use hemorrhoid cream if your caregiver approves.  If you develop varicose veins, wear support hose. Elevate your feet for 15 minutes, 3-4 times a day. Limit salt in your diet.  Avoid heavy lifting, wear low heal shoes, and practice good posture.  Rest a lot with your legs elevated if you have leg cramps or low back pain.  Visit your dentist if you have not gone during your pregnancy. Use a soft toothbrush to brush your teeth and be gentle when you floss.  A sexual relationship may be continued unless your caregiver directs you otherwise.  Do not travel far distances unless it is absolutely necessary and only with the approval  of your caregiver.  Take prenatal classes to understand, practice, and ask questions about the labor and delivery.  Make a trial run to the hospital.  Pack your hospital bag.  Prepare the baby's nursery.  Continue to go to all your prenatal visits as directed by your caregiver. SEEK MEDICAL CARE IF:  You are unsure if you are in labor or if your water has broken.  You have dizziness.  You have mild pelvic cramps, pelvic pressure, or nagging pain in your abdominal area.  You have persistent nausea, vomiting, or diarrhea.  You have a bad smelling vaginal discharge.  You have pain with urination. SEEK IMMEDIATE MEDICAL CARE IF:   You have a fever.  You are leaking fluid from your vagina.  You have spotting or bleeding from your vagina.  You have severe abdominal cramping or pain.  You have rapid weight loss or gain.  You have shortness of breath with chest pain.  You notice sudden or extreme swelling   of your face, hands, ankles, feet, or legs.  You have not felt your baby move in over an hour.  You have severe headaches that do not go away with medicine.  You have vision changes. Document Released: 07/22/2001 Document Revised: 08/02/2013 Document Reviewed: 09/28/2012 Exit[employer] Patient Information 2015 [employer]. This information is not intended to replace advice given to you by your health care provider. Make sure you discuss any questions you have with your health care provider.  

## 2014-05-07 ENCOUNTER — Inpatient Hospital Stay (HOSPITAL_COMMUNITY)
Admission: AD | Admit: 2014-05-07 | Discharge: 2014-05-07 | Disposition: A | Discharge status: Home / Self Care | Payer: Medicaid Other | Source: Ambulatory Visit | Attending: Obstetrics & Gynecology | Admitting: Obstetrics & Gynecology

## 2014-05-07 ENCOUNTER — Encounter (HOSPITAL_COMMUNITY): Payer: Self-pay | Admitting: *Deleted

## 2014-05-07 ENCOUNTER — Inpatient Hospital Stay (HOSPITAL_COMMUNITY): Payer: Medicaid Other

## 2014-05-07 DIAGNOSIS — O99891 Other specified diseases and conditions complicating pregnancy: Secondary | ICD-10-CM | POA: Diagnosis not present

## 2014-05-07 DIAGNOSIS — Z87891 Personal history of nicotine dependence: Secondary | ICD-10-CM | POA: Diagnosis not present

## 2014-05-07 DIAGNOSIS — O9989 Other specified diseases and conditions complicating pregnancy, childbirth and the puerperium: Principal | ICD-10-CM

## 2014-05-07 DIAGNOSIS — J069 Acute upper respiratory infection, unspecified: Secondary | ICD-10-CM

## 2014-05-07 DIAGNOSIS — R509 Fever, unspecified: Secondary | ICD-10-CM | POA: Diagnosis not present

## 2014-05-07 DIAGNOSIS — R109 Unspecified abdominal pain: Secondary | ICD-10-CM | POA: Insufficient documentation

## 2014-05-07 LAB — URINALYSIS, ROUTINE W REFLEX MICROSCOPIC
BILIRUBIN URINE: NEGATIVE
GLUCOSE, UA: NEGATIVE mg/dL
HGB URINE DIPSTICK: NEGATIVE
Ketones, ur: NEGATIVE mg/dL
Nitrite: NEGATIVE
Protein, ur: NEGATIVE mg/dL
SPECIFIC GRAVITY, URINE: 1.01 (ref 1.005–1.030)
Urobilinogen, UA: 2 mg/dL — ABNORMAL HIGH (ref 0.0–1.0)
pH: 7.5 (ref 5.0–8.0)

## 2014-05-07 LAB — CBC
HEMATOCRIT: 32.5 % — AB (ref 36.0–46.0)
Hemoglobin: 11.1 g/dL — ABNORMAL LOW (ref 12.0–15.0)
MCH: 29.4 pg (ref 26.0–34.0)
MCHC: 34.2 g/dL (ref 30.0–36.0)
MCV: 86.2 fL (ref 78.0–100.0)
Platelets: 147 10*3/uL — ABNORMAL LOW (ref 150–400)
RBC: 3.77 MIL/uL — AB (ref 3.87–5.11)
RDW: 14.7 % (ref 11.5–15.5)
WBC: 13.1 10*3/uL — AB (ref 4.0–10.5)

## 2014-05-07 LAB — URINE MICROSCOPIC-ADD ON

## 2014-05-07 MED ORDER — ACETAMINOPHEN 325 MG PO TABS
650.0000 mg | ORAL_TABLET | Freq: Four times a day (QID) | ORAL | Status: DC | PRN
  Administered 2014-05-07: 650 mg via ORAL
  Filled 2014-05-07: qty 2

## 2014-05-07 MED ORDER — LACTATED RINGERS IV BOLUS (SEPSIS)
500.0000 mL | Freq: Once | INTRAVENOUS | Status: AC
  Administered 2014-05-07: 04:00:00 via INTRAVENOUS

## 2014-05-07 MED ORDER — CYCLOBENZAPRINE HCL 5 MG PO TABS
5.0000 mg | ORAL_TABLET | Freq: Once | ORAL | Status: AC
  Administered 2014-05-07: 5 mg via ORAL
  Filled 2014-05-07: qty 1

## 2014-05-07 MED ORDER — CYCLOBENZAPRINE HCL 5 MG PO TABS: 5.0000 mg | ORAL_TABLET | Freq: Once | ORAL | Status: DC

## 2014-05-07 NOTE — MAU Provider Note (Signed)
Attestation of Attending Supervision of Fellow: Evaluation and management procedures were performed by the Fellow under my supervision and collaboration.  I have reviewed the Fellow's note and chart, and I agree with the management and plan.    

## 2014-05-07 NOTE — MAU Note (Signed)
I have severe h/a, cold chills, lower back pain and pain lower abd. Aching all over

## 2014-05-07 NOTE — MAU Provider Note (Signed)
History     CSN: [id number]  Arrival date and time: 05/07/14 0051   None     Chief Complaint  Patient presents with  . Back Pain  . Abdominal Pain  . Chills   Back Pain Associated symptoms include abdominal pain and a fever (subjective). Pertinent negatives include no chest pain or dysuria.  Abdominal Pain Associated symptoms include a fever (subjective), nausea and vomiting. Pertinent negatives include no diarrhea, dysuria, frequency or hematuria.    Patient here with headache, back.  Cough started 1 week ago(no wheezing, nonproductive, dry, nothing worsens/improves), today started to feel worse.  Also with nausea, threw up yesterday morning. (nonbilious, nonbloody).  Has not taken any medications.  Stomach pain: lower abdomen: worse when lays down, nothing improves, since this morning, intermittent.  Back pain: also since this morning, first symptom.  Nothing improves, worse when lays down.  Past Medical History  Diagnosis Date  . Seasonal allergies   . Arrhythmia     Past Surgical History  Procedure Laterality Date  . Tympanostomy tube placement      Family History  Problem Relation Age of Onset  . Diabetes Maternal Grandfather   . Cancer Paternal Grandmother   . Diabetes Paternal Grandfather     History  Substance Use Topics  . Smoking status: Former Smoker    Types: Cigarettes    Quit date: 11/02/2010  . Smokeless tobacco: Never Used  . Alcohol Use: No    Allergies: No Known Allergies  Prescriptions prior to admission  Medication Sig Dispense Refill  . Prenatal Vit-Fe Fumarate-FA (PRENATAL MULTIVITAMIN) TABS Take 1 tablet by mouth every morning.        Review of Systems  Constitutional: Positive for fever (subjective) and malaise/fatigue. Negative for chills and diaphoresis.  Respiratory: Positive for cough. Negative for shortness of breath.   Cardiovascular: Positive for leg swelling. Negative for chest pain.  Gastrointestinal: Positive for  nausea, vomiting and abdominal pain. Negative for heartburn and diarrhea.  Genitourinary: Negative for dysuria, urgency, frequency and hematuria.  Musculoskeletal: Positive for back pain.  Neurological:       No headache   Physical Exam   Blood pressure 138/72, pulse 121, temperature 98.2 F (36.8 C), resp. rate 18, height  (1.549 m), weight 240 lb 6.4 oz (109.045 kg), last menstrual period 08/18/2013, SpO2 97.00%.  Physical Exam  Constitutional: She is oriented to person, place, and time. She appears well-developed and well-nourished.  HENT:  Head: Normocephalic and atraumatic.  Eyes: Conjunctivae and EOM are normal.  Neck: Normal range of motion.  Cardiovascular: Normal rate, regular rhythm and normal heart sounds.   Respiratory: Effort normal. No respiratory distress.  GI: Soft. Bowel sounds are normal. She exhibits no distension. There is no tenderness.  Genitourinary:  No CVA tenderness to palpation  Musculoskeletal: Normal range of motion. She exhibits tenderness (lumbar spine and paraspinal region). She exhibits no edema.  Neurological: She is alert and oriented to person, place, and time.  Skin: Skin is warm and dry. No erythema.   Cervical exam: closed  MAU Course  Procedures  MDM NST: reactive CBC: no leukocytosis UA: normal  Assessment and Plan  Likely has viral upper respiratory infection, no signs of pneumonia.  Given handout of OTC meds safe in pregnancy.  Advised oral hydration.  FHT: baseline now elevated at 170, +accels ==> place IV, give bolus of LR  Will also give tylenol and flexeril for back pain, repeat temp normal.   05/07/2014, 2:57 AM  S/p 1L LR, tylenol, flexeril==> feeling better and requests to go home.  Baseline FHT now wnl, no longer tachycardic.    Perry Mount, MD 5:06 AM

## 2014-05-07 NOTE — MAU Note (Signed)
PT SAYS SHE HAD COLD   CHILLS  AT 5 PM.   SHE DID NOT TAKE TEMP AT HOME. SAYS SHE  VOMITED THIS AM X1 .   SAYS SHE HAD UC AT 10AM.    SAYS H/A STARTED  AT 1030PM-  NO MEDS.   COUGHING   STARTED 1 WEEK  AGO.   GETS [facility]- DOWNSTAIRS  - TOLD THEM ABOUT COUGH.

## 2014-05-08 LAB — RESPIRATORY VIRUS PANEL
Adenovirus: NOT DETECTED
INFLUENZA A: NOT DETECTED
Influenza A H1: NOT DETECTED
Influenza A H3: NOT DETECTED
Influenza B: NOT DETECTED
Metapneumovirus: NOT DETECTED
PARAINFLUENZA 1 A: NOT DETECTED
PARAINFLUENZA 2 A: NOT DETECTED
Parainfluenza 3: NOT DETECTED
Respiratory Syncytial Virus A: NOT DETECTED
Respiratory Syncytial Virus B: NOT DETECTED
Rhinovirus: NOT DETECTED

## 2014-05-15 ENCOUNTER — Ambulatory Visit (INDEPENDENT_AMBULATORY_CARE_PROVIDER_SITE_OTHER): Payer: Medicaid Other | Admitting: Obstetrics and Gynecology

## 2014-05-15 VITALS — BP 116/59 | HR 100 | Wt 239.0 lb

## 2014-05-15 DIAGNOSIS — O26893 Other specified pregnancy related conditions, third trimester: Secondary | ICD-10-CM

## 2014-05-15 DIAGNOSIS — O219 Vomiting of pregnancy, unspecified: Secondary | ICD-10-CM

## 2014-05-15 DIAGNOSIS — R12 Heartburn: Secondary | ICD-10-CM

## 2014-05-15 MED ORDER — PANTOPRAZOLE SODIUM 40 MG PO TBEC: 40.0000 mg | DELAYED_RELEASE_TABLET | Freq: Every day | ORAL | Status: DC

## 2014-05-15 MED ORDER — ONDANSETRON 4 MG PO TBDP: 4.0000 mg | ORAL_TABLET | Freq: Four times a day (QID) | ORAL | Status: DC | PRN

## 2014-05-15 NOTE — Progress Notes (Signed)
Having heartburn and vomiting. Also uncomfortable irregular UCs and LBP since last night. Rx Protonix to fill rx if OTC Tums, antacids ineffective. Zofran RX. S/sx labor and plans reviewed

## 2014-05-15 NOTE — Progress Notes (Signed)
Pt reports being unable to keep any food or liquids down. She feels a lot of pressure and contractions.

## 2014-05-15 NOTE — Patient Instructions (Signed)
Heartburn During Pregnancy  °Heartburn is a burning sensation in the chest caused by stomach acid backing up into the esophagus. Heartburn is common in pregnancy because a certain hormone (progesterone) is released when a woman is pregnant. The progesterone hormone may relax the valve that separates the esophagus from the stomach. This allows acid to go up into the esophagus, causing heartburn. Heartburn may also happen in pregnancy because the enlarging uterus pushes up on the stomach, which pushes more acid into the esophagus. This is especially true in the later stages of pregnancy. Heartburn problems usually go away after giving birth. °CAUSES  °Heartburn is caused by stomach acid backing up into the esophagus. During pregnancy, this may result from various things, including:  °· The progesterone hormone. °· Changing hormone levels. °· The growing uterus pushing stomach acid upward. °· Large meals. °· Certain foods and drinks. °· Exercise. °· Increased acid production. °SIGNS AND SYMPTOMS  °· Burning pain in the chest or lower throat. °· Bitter taste in the mouth. °· Coughing. °DIAGNOSIS  °Your health care provider will typically diagnose heartburn by taking a careful history of your concern. Blood tests may be done to check for a certain type of bacteria that is associated with heartburn. Sometimes, heartburn is diagnosed by prescribing a heartburn medicine to see if the symptoms improve. In some cases, a procedure called an endoscopy may be done. In this procedure, a tube with a light and a camera on the end (endoscope) is used to examine the esophagus and the stomach. °TREATMENT  °Treatment will vary depending on the severity of your symptoms. Your health care provider may recommend: °· Over-the-counter medicines (antacids, acid reducers) for mild heartburn. °· Prescription medicines to decrease stomach acid or to protect your stomach lining. °· Certain changes in your diet. °· Elevating the head of your bed  by putting blocks under the legs. This helps prevent stomach acid from backing up into the esophagus when you are lying down. °HOME CARE INSTRUCTIONS  °· Only take over-the-counter or prescription medicines as directed by your health care provider. °· Raise the head of your bed by putting blocks under the legs if instructed to do so by your health care provider. Sleeping with more pillows is not effective because it only changes the position of your head. °· Do not exercise right after eating. °· Avoid eating 2-3 hours before bed. Do not lie down right after eating. °· Eat small meals throughout the day instead of three large meals. °· Identify foods and beverages that make your symptoms worse and avoid them. Foods you may want to avoid include: °¨ Peppers. °¨ Chocolate. °¨ High-fat foods, including fried foods. °¨ Spicy foods. °¨ Garlic and onions. °¨ Citrus fruits, including oranges, grapefruit, lemons, and limes. °¨ Food containing tomatoes or tomato products. °¨ Mint. °¨ Carbonated and caffeinated drinks. °¨ Vinegar. °SEEK MEDICAL CARE IF: °· You have abdominal pain of any kind. °· You feel burning in your upper abdomen or chest, especially after eating or lying down. °· You have nausea and vomiting. °· Your stomach feels upset after you eat. °SEEK IMMEDIATE MEDICAL CARE IF:  °· You have severe chest pain that goes down your arm or into your jaw or neck. °· You feel sweaty, dizzy, or light-headed. °· You become short of breath. °· You vomit blood. °· You have difficulty or pain with swallowing. °· You have bloody or black, tarry stools. °· You have episodes of heartburn more than 3 times a   week, for more than 2 weeks. °MAKE SURE YOU: °· Understand these instructions. °· Will watch your condition. °· Will get help right away if you are not doing well or get worse. °Document Released: 07/25/2000 Document Revised: 08/02/2013 Document Reviewed: 03/16/2013 °[employer]® Patient Information ©2015 [employer]. This  information is not intended to replace advice given to you by your health care provider. Make sure you discuss any questions you have with your health care provider. ° °

## 2014-05-18 LAB — POCT URINALYSIS DIP (DEVICE)
BILIRUBIN URINE: NEGATIVE
Glucose, UA: NEGATIVE mg/dL
Hgb urine dipstick: NEGATIVE
Nitrite: NEGATIVE
PH: 7 (ref 5.0–8.0)
PROTEIN: NEGATIVE mg/dL
Specific Gravity, Urine: 1.01 (ref 1.005–1.030)
Urobilinogen, UA: 2 mg/dL — ABNORMAL HIGH (ref 0.0–1.0)

## 2014-05-19 ENCOUNTER — Inpatient Hospital Stay (HOSPITAL_COMMUNITY)
Admission: AD | Admit: 2014-05-19 | Discharge: 2014-05-20 | Disposition: A | Discharge status: Home / Self Care | Payer: Medicaid Other | Source: Ambulatory Visit | Attending: Obstetrics & Gynecology | Admitting: Obstetrics & Gynecology

## 2014-05-19 ENCOUNTER — Encounter (HOSPITAL_COMMUNITY): Payer: Self-pay | Admitting: *Deleted

## 2014-05-19 DIAGNOSIS — O471 False labor at or after 37 completed weeks of gestation: Secondary | ICD-10-CM | POA: Insufficient documentation

## 2014-05-19 DIAGNOSIS — Z3A39 39 weeks gestation of pregnancy: Secondary | ICD-10-CM | POA: Insufficient documentation

## 2014-05-19 DIAGNOSIS — Z3403 Encounter for supervision of normal first pregnancy, third trimester: Secondary | ICD-10-CM

## 2014-05-19 DIAGNOSIS — O288 Other abnormal findings on antenatal screening of mother: Secondary | ICD-10-CM

## 2014-05-19 NOTE — MAU Note (Signed)
Pt reprots having ctx on and off all day. More intense now. Reports mucsy discharge but denies bleeding or SROM. Good fetal movement reported

## 2014-05-20 ENCOUNTER — Inpatient Hospital Stay (HOSPITAL_COMMUNITY): Payer: Medicaid Other

## 2014-05-20 DIAGNOSIS — O471 False labor at or after 37 completed weeks of gestation: Secondary | ICD-10-CM | POA: Diagnosis not present

## 2014-05-20 DIAGNOSIS — Z3A39 39 weeks gestation of pregnancy: Secondary | ICD-10-CM | POA: Diagnosis not present

## 2014-05-20 DIAGNOSIS — O4703 False labor before 37 completed weeks of gestation, third trimester: Secondary | ICD-10-CM | POA: Diagnosis present

## 2014-05-21 ENCOUNTER — Inpatient Hospital Stay (HOSPITAL_COMMUNITY)
Admission: AD | Admit: 2014-05-21 | Discharge: 2014-05-23 | DRG: 774 | Disposition: A | Discharge status: Home / Self Care | Payer: Medicaid Other | Source: Ambulatory Visit | Attending: Family Medicine | Admitting: Family Medicine

## 2014-05-21 ENCOUNTER — Encounter (HOSPITAL_COMMUNITY): Payer: Self-pay

## 2014-05-21 ENCOUNTER — Encounter (HOSPITAL_COMMUNITY): Payer: Medicaid Other | Admitting: Anesthesiology

## 2014-05-21 ENCOUNTER — Inpatient Hospital Stay (HOSPITAL_COMMUNITY): Payer: Medicaid Other | Admitting: Anesthesiology

## 2014-05-21 DIAGNOSIS — Z833 Family history of diabetes mellitus: Secondary | ICD-10-CM

## 2014-05-21 DIAGNOSIS — N719 Inflammatory disease of uterus, unspecified: Secondary | ICD-10-CM | POA: Diagnosis present

## 2014-05-21 DIAGNOSIS — Z30018 Encounter for initial prescription of other contraceptives: Secondary | ICD-10-CM | POA: Diagnosis not present

## 2014-05-21 DIAGNOSIS — O4292 Full-term premature rupture of membranes, unspecified as to length of time between rupture and onset of labor: Principal | ICD-10-CM | POA: Diagnosis present

## 2014-05-21 DIAGNOSIS — O411230 Chorioamnionitis, third trimester, not applicable or unspecified: Secondary | ICD-10-CM

## 2014-05-21 DIAGNOSIS — Z87891 Personal history of nicotine dependence: Secondary | ICD-10-CM | POA: Diagnosis not present

## 2014-05-21 DIAGNOSIS — Z3403 Encounter for supervision of normal first pregnancy, third trimester: Secondary | ICD-10-CM

## 2014-05-21 DIAGNOSIS — Z3A39 39 weeks gestation of pregnancy: Secondary | ICD-10-CM | POA: Diagnosis present

## 2014-05-21 DIAGNOSIS — O4290 Premature rupture of membranes, unspecified as to length of time between rupture and onset of labor, unspecified weeks of gestation: Secondary | ICD-10-CM | POA: Diagnosis present

## 2014-05-21 HISTORY — DX: Pneumonia, unspecified organism: J18.9

## 2014-05-21 LAB — POCT FERN TEST: POCT Fern Test: POSITIVE

## 2014-05-21 LAB — TYPE AND SCREEN
ABO/RH(D): O POS
ANTIBODY SCREEN: NEGATIVE

## 2014-05-21 LAB — RPR

## 2014-05-21 LAB — CBC
HCT: 32.3 % — ABNORMAL LOW (ref 36.0–46.0)
Hemoglobin: 11 g/dL — ABNORMAL LOW (ref 12.0–15.0)
MCH: 29.3 pg (ref 26.0–34.0)
MCHC: 34.1 g/dL (ref 30.0–36.0)
MCV: 85.9 fL (ref 78.0–100.0)
Platelets: 225 10*3/uL (ref 150–400)
RBC: 3.76 MIL/uL — AB (ref 3.87–5.11)
RDW: 14 % (ref 11.5–15.5)
WBC: 13.3 10*3/uL — ABNORMAL HIGH (ref 4.0–10.5)

## 2014-05-21 MED ORDER — OXYTOCIN 40 UNITS IN LACTATED RINGERS INFUSION - SIMPLE MED: 62.5000 mL/h | INTRAVENOUS | Status: DC

## 2014-05-21 MED ORDER — GENTAMICIN SULFATE 40 MG/ML IJ SOLN
170.0000 mg | Freq: Once | INTRAVENOUS | Status: DC
  Filled 2014-05-21: qty 4.25

## 2014-05-21 MED ORDER — EPHEDRINE 5 MG/ML INJ
10.0000 mg | INTRAVENOUS | Status: DC | PRN
  Filled 2014-05-21: qty 2

## 2014-05-21 MED ORDER — GENTAMICIN SULFATE 40 MG/ML IJ SOLN
160.0000 mg | Freq: Three times a day (TID) | INTRAVENOUS | Status: DC
  Filled 2014-05-21: qty 4

## 2014-05-21 MED ORDER — PHENYLEPHRINE 40 MCG/ML (10ML) SYRINGE FOR IV PUSH (FOR BLOOD PRESSURE SUPPORT)
80.0000 ug | PREFILLED_SYRINGE | INTRAVENOUS | Status: DC | PRN
  Filled 2014-05-21: qty 2
  Filled 2014-05-21: qty 10

## 2014-05-21 MED ORDER — LIDOCAINE HCL (PF) 1 % IJ SOLN
30.0000 mL | INTRAMUSCULAR | Status: DC | PRN
  Filled 2014-05-21: qty 30

## 2014-05-21 MED ORDER — DIPHENHYDRAMINE HCL 50 MG/ML IJ SOLN: 12.5000 mg | INTRAMUSCULAR | Status: DC | PRN

## 2014-05-21 MED ORDER — LACTATED RINGERS IV SOLN: 500.0000 mL | INTRAVENOUS | Status: DC | PRN

## 2014-05-21 MED ORDER — GENTAMICIN SULFATE 40 MG/ML IJ SOLN
Freq: Three times a day (TID) | INTRAVENOUS | Status: AC
  Administered 2014-05-22 (×2): via INTRAVENOUS
  Filled 2014-05-21 (×2): qty 4

## 2014-05-21 MED ORDER — TERBUTALINE SULFATE 1 MG/ML IJ SOLN: 0.2500 mg | Freq: Once | INTRAMUSCULAR | Status: AC | PRN

## 2014-05-21 MED ORDER — OXYTOCIN 40 UNITS IN LACTATED RINGERS INFUSION - SIMPLE MED
1.0000 m[IU]/min | INTRAVENOUS | Status: DC
  Administered 2014-05-21: 2 m[IU]/min via INTRAVENOUS
  Filled 2014-05-21: qty 1000

## 2014-05-21 MED ORDER — CLINDAMYCIN PHOSPHATE 900 MG/50ML IV SOLN: 900.0000 mg | Freq: Three times a day (TID) | INTRAVENOUS | Status: DC

## 2014-05-21 MED ORDER — ONDANSETRON HCL 4 MG/2ML IJ SOLN: 4.0000 mg | Freq: Four times a day (QID) | INTRAMUSCULAR | Status: DC | PRN

## 2014-05-21 MED ORDER — OXYTOCIN BOLUS FROM INFUSION: 500.0000 mL | INTRAVENOUS | Status: DC

## 2014-05-21 MED ORDER — SODIUM CHLORIDE 0.9 % IV SOLN
2.0000 g | Freq: Four times a day (QID) | INTRAVENOUS | Status: DC
  Filled 2014-05-21 (×2): qty 2000

## 2014-05-21 MED ORDER — FENTANYL 2.5 MCG/ML BUPIVACAINE 1/10 % EPIDURAL INFUSION (WH - ANES)
INTRAMUSCULAR | Status: DC | PRN
  Administered 2014-05-21: 14 mL/h via EPIDURAL

## 2014-05-21 MED ORDER — ACETAMINOPHEN 325 MG PO TABS
650.0000 mg | ORAL_TABLET | ORAL | Status: DC | PRN
  Administered 2014-05-21: 325 mg via ORAL
  Administered 2014-05-21 (×2): 650 mg via ORAL
  Filled 2014-05-21 (×3): qty 2

## 2014-05-21 MED ORDER — OXYCODONE-ACETAMINOPHEN 5-325 MG PO TABS: 1.0000 | ORAL_TABLET | ORAL | Status: DC | PRN

## 2014-05-21 MED ORDER — FENTANYL 2.5 MCG/ML BUPIVACAINE 1/10 % EPIDURAL INFUSION (WH - ANES): 14.0000 mL/h | INTRAMUSCULAR | Status: DC | PRN

## 2014-05-21 MED ORDER — CITRIC ACID-SODIUM CITRATE 334-500 MG/5ML PO SOLN: 30.0000 mL | ORAL | Status: DC | PRN

## 2014-05-21 MED ORDER — PHENYLEPHRINE 40 MCG/ML (10ML) SYRINGE FOR IV PUSH (FOR BLOOD PRESSURE SUPPORT)
80.0000 ug | PREFILLED_SYRINGE | INTRAVENOUS | Status: DC | PRN
  Filled 2014-05-21: qty 2

## 2014-05-21 MED ORDER — FLEET ENEMA 7-19 GM/118ML RE ENEM: 1.0000 | ENEMA | RECTAL | Status: DC | PRN

## 2014-05-21 MED ORDER — LIDOCAINE HCL (PF) 1 % IJ SOLN
INTRAMUSCULAR | Status: DC | PRN
  Administered 2014-05-21 (×2): 4 mL

## 2014-05-21 MED ORDER — GENTAMICIN SULFATE 40 MG/ML IJ SOLN
Freq: Once | INTRAVENOUS | Status: AC
  Administered 2014-05-21: 23:00:00 via INTRAVENOUS
  Filled 2014-05-21: qty 4.25

## 2014-05-21 MED ORDER — FENTANYL 2.5 MCG/ML BUPIVACAINE 1/10 % EPIDURAL INFUSION (WH - ANES)
14.0000 mL/h | INTRAMUSCULAR | Status: DC | PRN
  Filled 2014-05-21: qty 125

## 2014-05-21 MED ORDER — LACTATED RINGERS IV SOLN
INTRAVENOUS | Status: DC
  Administered 2014-05-21 (×2): via INTRAVENOUS

## 2014-05-21 MED ORDER — OXYCODONE-ACETAMINOPHEN 5-325 MG PO TABS: 2.0000 | ORAL_TABLET | ORAL | Status: DC | PRN

## 2014-05-21 MED ORDER — LACTATED RINGERS IV SOLN: 500.0000 mL | Freq: Once | INTRAVENOUS | Status: DC

## 2014-05-21 MED ORDER — FENTANYL CITRATE 0.05 MG/ML IJ SOLN
100.0000 ug | INTRAMUSCULAR | Status: DC | PRN
  Administered 2014-05-21: 100 ug via INTRAVENOUS
  Filled 2014-05-21: qty 2

## 2014-05-21 NOTE — Progress Notes (Signed)
LABOR PROGRESS NOTE  Jodi Terrell is a 18 y.o. G2P0010 at 5836w3d  admitted for rupture of membranes  Subjective: Uncomfortable with contractions, would like to be more mobile, get in shower, walk around.  Objective: BP 128/69  Pulse 109  Temp(Src) 98 F (36.7 C) (Oral)  Resp 18  LMP 08/18/2013 or  Filed Vitals:   05/21/14 0617 05/21/14 0711  BP: 121/63 128/69  Pulse: 110 109  Temp: 98.3 F (36.8 C) 98 F (36.7 C)  TempSrc: Oral Oral  Resp: 20 18       FHT:  FHR: 150 bpm, variability: moderate,  accelerations:  Present,  decelerations:  Present few late decelerations, difficult to trace secondary to poor toco tracing UC:   q2-603min noted while I was in room  Bulging bag noted on exam, foley bulb placed without difficulty  SVE:   Dilation: 2 Effacement (%): 80 Station: -2 Exam by:: dr Loreta Aveacosta  Dilation: 2 Effacement (%): 80 Cervical Position: Middle Station: -2 Presentation: Vertex Exam by:: dr Loreta Aveacosta   Labs: Lab Results  Component Value Date   WBC 13.3* 05/21/2014   HGB 11.0* 05/21/2014   HCT 32.3* 05/21/2014   MCV 85.9 05/21/2014   PLT 225 05/21/2014    Assessment / Plan: Spontaneous labor, progressing normally  Labor: Progressing normally and will start pitocin when able, FB placed at 0840 Fetal Wellbeing:  Category II, will try Beacon for better tracing. If needed will place FSE for better tracing.  Patient advised of possible late decelerations Pain Control:  Labor support without medications Anticipated MOD:  NSVD  Delaynee Alred ROCIO, MD 05/21/2014, 8:52 AM

## 2014-05-21 NOTE — Anesthesia Preprocedure Evaluation (Signed)
Anesthesia Evaluation  Patient identified by MRN, date of birth, ID band Patient awake    Reviewed: Allergy & Precautions, H&P , NPO status , Patient's Chart, lab work & pertinent test results  Airway Mallampati: II TM Distance: >3 FB Neck ROM: Full    Dental no notable dental hx.    Pulmonary pneumonia -, resolved, former smoker,  breath sounds clear to auscultation  Pulmonary exam normal       Cardiovascular negative cardio ROS  + dysrhythmias Rhythm:Regular Rate:Normal     Neuro/Psych negative neurological ROS  negative psych ROS   GI/Hepatic negative GI ROS, Neg liver ROS,   Endo/Other  negative endocrine ROS  Renal/GU negative Renal ROS     Musculoskeletal negative musculoskeletal ROS (+)   Abdominal (+) + obese,   Peds  Hematology negative hematology ROS (+)   Anesthesia Other Findings   Reproductive/Obstetrics (+) Pregnancy                           Anesthesia Physical Anesthesia Plan  ASA: III  Anesthesia Plan: Epidural   Post-op Pain Management:    Induction:   Airway Management Planned:   Additional Equipment:   Intra-op Plan:   Post-operative Plan:   Informed Consent: I have reviewed the patients History and Physical, chart, labs and discussed the procedure including the risks, benefits and alternatives for the proposed anesthesia with the patient or authorized representative who has indicated his/her understanding and acceptance.     Plan Discussed with:   Anesthesia Plan Comments:         Anesthesia Quick Evaluation

## 2014-05-21 NOTE — Progress Notes (Signed)
Pt not available for assessment: Pt in nursery with infant.  Infant under oxy hood and most likely will be transferred to NICU.

## 2014-05-21 NOTE — H&P (Signed)
Jodi GuardianMercedes C Wolford is a 18 y.o. female at 6765w3d by LMP consistent with 2273w4d U/S presenting for PROM.  Clinic  [facility]  Dating  LMP: 08/18/2013   Genetic Screen 1 Screen:  Decline 1st trimeste    AFP:  Quad: Elevated DSR 1:58  NIPS: Low risk  Anatomic US Nl   GTT Early:             Third trimester: 8093  TDaP vaccine   02/22/14  Flu vaccine 04/26/14  GBS  negative   Contraception  Nexplanon  Baby Food  Breast  Circumcision female  Pediatrician   Support Person  FOB -    Maternal Medical History:  Reason for admission: Rupture of membranes and contractions.    Contractions started yesterday, continued throughout the day about every 5-10 minutes (stayed about the same, did not get stronger/more frequent). Around 4:15am this morning patient was in the shower and noted gush of fluid so she came to the MAU to be evaluated. Denies any VB, +FM. Denies HA, CP, SOB, changes in vision, some feet swelling, no RUQ pain. Prenatal course has been uncomplicated (but noted to have quad screen elevated DSR 1:58).  OB History   Grav Para Term Preterm Abortions TAB SAB Ect Mult Living   2    1  1         Past Medical History  Diagnosis Date  . Seasonal allergies   . Arrhythmia   . Pneumonia    Past Surgical History  Procedure Laterality Date  . Tympanostomy tube placement     Family History: family history includes Cancer in her paternal grandmother; Diabetes in her maternal grandfather and paternal grandfather. Social History:  reports that she quit smoking about 3 years ago. Her smoking use included Cigarettes. She smoked 0.00 packs per day. She has never used smokeless tobacco. She reports that she does not drink alcohol or use illicit drugs.   Prenatal Transfer Tool  Maternal Diabetes: No Genetic Screening: Abnormal:  Results: Other:elevated DSR 1:58 Maternal Ultrasounds/Referrals: Normal Fetal Ultrasounds or other Referrals:  None Maternal Substance Abuse:  No Significant Maternal Medications:   None Significant Maternal Lab Results:  None Other Comments:  elevated DSR 1:58 by quad screen  Review of Systems  Constitutional: Negative for fever and chills.  Eyes: Negative for blurred vision and double vision.  Respiratory: Negative for shortness of breath.   Cardiovascular: Positive for leg swelling. Negative for chest pain.  Gastrointestinal: Negative for vomiting.  Genitourinary: Negative for dysuria.  Neurological: Negative for dizziness and headaches.  All other systems reviewed and are negative.    Dilation: 2.5 Effacement (%): 80 Station: -2 Exam by:: Remigio EisenmengerBenji stanley RN Last menstrual period 08/18/2013.   Fetal Exam Fetal Monitor Review: Mode: ultrasound.   Baseline rate: 150.  Variability: moderate (6-25 bpm).   Pattern: no accelerations and no decelerations.    Fetal State Assessment: Category I - tracings are normal.     Physical Exam  Nursing note and vitals reviewed. Constitutional: She is oriented to person, place, and time. She appears well-developed and well-nourished.  HENT:  Head: Normocephalic and atraumatic.  Cardiovascular: Normal rate, regular rhythm, normal heart sounds and intact distal pulses.  Exam reveals no gallop and no friction rub.   No murmur heard. Respiratory: Effort normal and breath sounds normal. No respiratory distress. She has no wheezes.  GI: Soft. There is no tenderness.  Musculoskeletal: She exhibits edema (trace in both feet). She exhibits no tenderness.  Neurological: She  is alert and oriented to person, place, and time.  Skin: Skin is warm and dry.  Psychiatric: She has a normal mood and affect. Her behavior is normal. Thought content normal.    Prenatal labs: ABO, Rh: O/POS/-- (03/24 1448) Antibody: NEG (03/24 1448) Rubella: 0.36 (03/24 1448) RPR: NON REAC (07/15 1516)  HBsAg: NEGATIVE (03/24 1448)  HIV: NONREACTIVE (07/15 1516)  GBS: Negative (09/16 0000)   Assessment/Plan: Jodi Terrell is a 18 y.o.  G2P0010 at 5166w3d by LMP consistent with 7474w4d U/S here for PROM and active labor  #Labor: expectant management #Pain: Epidural on request #FWB:  cat1 (though only a few minutes on monitor so far) #ID:  GBS negative #MOF:  breast #MOC: nexplanon #Circ:  No, female.    Tawni CarnesWight, Andrew 05/21/2014, 6:32 AM   I spoke with and examined patient and agree with resident/PA/SNM's note and plan of care. [facility]  DSR 1:58 via Quad screen, w/ low-risk NIPS. 1hr glucola 93.   Cheral MarkerKimberly R. Izella Ybanez, CNM, [facility]-BC 05/21/2014 7:55 AM

## 2014-05-21 NOTE — MAU Note (Signed)
Leaking at 4 am.  Contractions stronger all day.  Was 1 cm at last exam in MAU on Friday.  Baby moving well. No bleeding.

## 2014-05-21 NOTE — MAU Note (Signed)
[facility] Strip reviewed.

## 2014-05-21 NOTE — Progress Notes (Signed)
LABOR PROGRESS NOTE  Jodi Terrell is a 18 y.o. G2P0010 at 1541w3d  admitted for rupture of membranes  Subjective: Very uncomfortable with contractions, would like pain medication  Objective: BP 147/83  Pulse 103  Temp(Src) 97.8 F (36.6 C) (Oral)  Resp 20  Ht 5\' 1"  (1.549 m)  Wt 239 lb (108.41 kg)  BMI 45.18 kg/m2  SpO2 98%  LMP 08/18/2013 or  Filed Vitals:   05/21/14 1311 05/21/14 1312 05/21/14 1313 05/21/14 1315  BP:  119/81 134/82 147/83  Pulse: 103 102 105 103  Temp:      TempSrc:      Resp:      Height:      Weight:      SpO2:  98%         FHT:  FHR: 150 bpm, variability: moderate,  accelerations:  Present,  decelerations:  No decelerations UC:   q2-743min  SVE:   Dilation: 5.5 Effacement (%): 80 Station: -1 Exam by:: dr Loreta Aveacosta  Dilation: 5.5 Effacement (%): 80 Cervical Position: Middle Station: -1 Presentation: Vertex Exam by:: dr Loreta Aveacosta   Labs: Lab Results  Component Value Date   WBC 13.3* 05/21/2014   HGB 11.0* 05/21/2014   HCT 32.3* 05/21/2014   MCV 85.9 05/21/2014   PLT 225 05/21/2014    Assessment / Plan: Spontaneous labor, progressing normally  Labor: Progressing normally, FB out, AROM of redundant membranes (pt admitted with SROM) this check with moderate meconium.  If needed at next check will start pitocin Fetal Wellbeing:  Cat I Pain Control:  Labor support without medications Anticipated MOD:  NSVD  Jodi Steffensmeier ROCIO, MD 05/21/2014, 1:18 PM

## 2014-05-21 NOTE — Progress Notes (Addendum)
ANTIBIOTIC CONSULT NOTE - INITIAL  Pharmacy Consult for Gentamicin Indication: Chorioamnionitis  No Known Allergies  Patient Measurements: Height: _0  (154.9 cm) Weight: 239 lb (108.41 kg) IBW/kg (Calculated) : 47.8 Adjusted Body Weight: 66kg  Vital Signs: Temp: 100.1 F (37.8 C) (10/11 1643) Temp Source: Oral (10/11 1643) BP: 142/73 mmHg (10/11 1816) Pulse Rate: 130 (10/11 1816)    Intake/Output from this shift: Total I/O In: -  Out: 350 [Blood:350]  Labs:  Recent Labs  05/21/14 0640  WBC 13.3*  HGB 11.0*  PLT 225   CrCl is unknown because no creatinine reading has been taken. No results found for this basename: VANCOTROUGH, Corlis Leak, VANCORANDOM, GENTTROUGH, GENTPEAK, GENTRANDOM, TOBRATROUGH, TOBRAPEAK, TOBRARND, AMIKACINPEAK, AMIKACINTROU, AMIKACIN,  in the last 72 hours   Microbiology: Recent Results (from the past 720 hour(s))  OB RESULTS CONSOLE GBS     Status: None   Collection Time    04/26/14 12:00 AM      Result Value Ref Range Status   GBS Negative   Final  CULTURE, BETA STREP (GROUP B ONLY)     Status: None   Collection Time    04/26/14 10:23 AM      Result Value Ref Range Status   Organism ID, Bacteria NO GROUP B STREP (S.AGALACTIAE) ISOLATED   Final  GC/CHLAMYDIA PROBE AMP     Status: None   Collection Time    04/26/14 10:23 AM      Result Value Ref Range Status   CT Probe RNA NEGATIVE   Final   GC Probe RNA NEGATIVE   Final   Comment:                                                                                             **Normal Reference Range: Negative**                 Assay performed using the Gen-Probe APTIMA COMBO2 (R) Assay.           Acceptable specimen types for this assay include APTIMA Swabs (Unisex,     endocervical, urethral, or vaginal), first void urine, and ThinPrep     liquid based cytology samples.  RESPIRATORY VIRUS PANEL     Status: None   Collection Time    05/07/14  1:55 AM      Result Value Ref Range  Status   Source - RVPAN NOSE   Final   Respiratory Syncytial Virus A NOT DETECTED   Final   Respiratory Syncytial Virus B NOT DETECTED   Final   Influenza A NOT DETECTED   Final   Influenza B NOT DETECTED   Final   Parainfluenza 1 NOT DETECTED   Final   Parainfluenza 2 NOT DETECTED   Final   Parainfluenza 3 NOT DETECTED   Final   Metapneumovirus NOT DETECTED   Final   Rhinovirus NOT DETECTED   Final   Adenovirus NOT DETECTED   Final   Influenza A H1 NOT DETECTED   Final   Influenza A H3 NOT DETECTED   Final   Comment: (NOTE)  Normal Reference Range for each Analyte: NOT DETECTED     Testing performed using the Luminex xTAG Respiratory Viral Panel test     kit.     The analytical performance characteristics of this assay have been     determined by [employer].  The modifications have not been     cleared or approved by the FDA. This assay has been validated pursuant     to the CLIA regulations and is used for clinical purposes.     Performed at [facility] History: Past Medical History  Diagnosis Date  . Seasonal allergies   . Arrhythmia   . Pneumonia     Medications: Ampicillin 2 g IV Q6 hours, Clinda 958m IV Q8 hours (mixed in same bag as GNorva Karvonen  Assessment: Admitted with SROM, developed chorioamnionitis, recently delivered viable female at 5:20pm  Goal of Therapy:  Peak ~ 6-749mL, Trough < 1 mg/L  Plan:  1) Load Gentamicin 1703mV now. 2) Gentamicin 160 mg IV Q8 to begin 8 hours after load.  Hovey-Rankin, Emerlyn Mehlhoff 05/21/2014,6:33 PM

## 2014-05-21 NOTE — Anesthesia Procedure Notes (Signed)
Epidural Patient location during procedure: OB  Staffing Anesthesiologist: Sadler Teschner R Performed by: anesthesiologist   Preanesthetic Checklist Completed: patient identified, pre-op evaluation, timeout performed, IV checked, risks and benefits discussed and monitors and equipment checked  Epidural Patient position: sitting Prep: site prepped and draped and DuraPrep Patient monitoring: heart rate Approach: midline Location: L3-L4 Injection technique: LOR air and LOR saline  Needle:  Needle type: Tuohy  Needle gauge: 17 G Needle length: 9 cm Needle insertion depth: 8 cm Catheter type: closed end flexible Catheter size: 19 Gauge Catheter at skin depth: 14 cm Test dose: negative  Assessment Sensory level: T8 Events: blood not aspirated, injection not painful, no injection resistance, negative IV test and no paresthesia  Additional Notes Reason for block:procedure for pain   

## 2014-05-22 ENCOUNTER — Encounter: Payer: Medicaid Other | Admitting: Physician Assistant

## 2014-05-22 LAB — CBC
HCT: 28.9 % — ABNORMAL LOW (ref 36.0–46.0)
Hemoglobin: 9.6 g/dL — ABNORMAL LOW (ref 12.0–15.0)
MCH: 28.9 pg (ref 26.0–34.0)
MCHC: 33.2 g/dL (ref 30.0–36.0)
MCV: 87 fL (ref 78.0–100.0)
PLATELETS: 217 10*3/uL (ref 150–400)
RBC: 3.32 MIL/uL — ABNORMAL LOW (ref 3.87–5.11)
RDW: 14.4 % (ref 11.5–15.5)
WBC: 16 10*3/uL — AB (ref 4.0–10.5)

## 2014-05-22 MED ORDER — ONDANSETRON HCL 4 MG/2ML IJ SOLN: 4.0000 mg | INTRAMUSCULAR | Status: DC | PRN

## 2014-05-22 MED ORDER — BENZOCAINE-MENTHOL 20-0.5 % EX AERO: 1.0000 "application " | INHALATION_SPRAY | CUTANEOUS | Status: DC | PRN

## 2014-05-22 MED ORDER — ONDANSETRON HCL 4 MG PO TABS: 4.0000 mg | ORAL_TABLET | ORAL | Status: DC | PRN

## 2014-05-22 MED ORDER — ZOLPIDEM TARTRATE 5 MG PO TABS: 5.0000 mg | ORAL_TABLET | Freq: Every evening | ORAL | Status: DC | PRN

## 2014-05-22 MED ORDER — DIPHENHYDRAMINE HCL 25 MG PO CAPS: 25.0000 mg | ORAL_CAPSULE | Freq: Four times a day (QID) | ORAL | Status: DC | PRN

## 2014-05-22 MED ORDER — DIBUCAINE 1 % RE OINT: 1.0000 "application " | TOPICAL_OINTMENT | RECTAL | Status: DC | PRN

## 2014-05-22 MED ORDER — LANOLIN HYDROUS EX OINT: TOPICAL_OINTMENT | CUTANEOUS | Status: DC | PRN

## 2014-05-22 MED ORDER — TETANUS-DIPHTH-ACELL PERTUSSIS 5-2.5-18.5 LF-MCG/0.5 IM SUSP: 0.5000 mL | Freq: Once | INTRAMUSCULAR | Status: DC

## 2014-05-22 MED ORDER — OXYCODONE-ACETAMINOPHEN 5-325 MG PO TABS
1.0000 | ORAL_TABLET | ORAL | Status: DC | PRN
  Administered 2014-05-22 – 2014-05-23 (×3): 1 via ORAL
  Filled 2014-05-22 (×3): qty 1

## 2014-05-22 MED ORDER — SIMETHICONE 80 MG PO CHEW
80.0000 mg | CHEWABLE_TABLET | ORAL | Status: DC | PRN
  Administered 2014-05-22: 80 mg via ORAL
  Filled 2014-05-22: qty 1

## 2014-05-22 MED ORDER — PRENATAL MULTIVITAMIN CH
1.0000 | ORAL_TABLET | Freq: Every day | ORAL | Status: DC
  Administered 2014-05-22 – 2014-05-23 (×2): 1 via ORAL
  Filled 2014-05-22 (×2): qty 1

## 2014-05-22 MED ORDER — WITCH HAZEL-GLYCERIN EX PADS: 1.0000 "application " | MEDICATED_PAD | CUTANEOUS | Status: DC | PRN

## 2014-05-22 MED ORDER — IBUPROFEN 600 MG PO TABS
600.0000 mg | ORAL_TABLET | Freq: Four times a day (QID) | ORAL | Status: DC
Start: 2014-05-22 — End: 2014-05-23
  Administered 2014-05-22 – 2014-05-23 (×7): 600 mg via ORAL
  Filled 2014-05-22 (×7): qty 1

## 2014-05-22 MED ORDER — SENNOSIDES-DOCUSATE SODIUM 8.6-50 MG PO TABS
2.0000 | ORAL_TABLET | ORAL | Status: DC
  Administered 2014-05-22: 2 via ORAL
  Filled 2014-05-22: qty 2

## 2014-05-22 NOTE — Lactation Note (Signed)
This note was copied from the chart of Jodi Terrell. Lactation Consultation Note Initial consult with this mom of a term baby, in NICU with oxygen requirements, on HFNC 4 L, and 35 % oxygen. I started mom pumping with DEP, in premie setting, and showed her how to hand express, and she was able to collect about 0.4 mls of colostrum. NICU booklet reviewed, and [facility]. Mom knows to call for questions/concerns.  Patient Name: Jodi Hipolito BayleyMercedes Rae ZOXWR'UToday's Date: 05/22/2014 Reason for consult: Initial assessment;NICU baby   Maternal Data Formula Feeding for Exclusion: Yes (baby in NICU) Has patient been taught Hand Expression?: Yes Does the patient have breastfeeding experience prior to this delivery?: No  Feeding    LATCH Score/Interventions                      Lactation Tools Discussed/Used WIC Program: Yes [facility] Pump Review: Setup, frequency, and cleaning;Milk Storage;Other (comment) (premie setting, hand expression) Initiated by:: clee rn lc at 17 hours of age Date initiated:: 05/22/14   Consult Status Consult Status: Follow-up Date: 05/23/14 Follow-up type: In-patient    Alfred LevinsLee, Braycen Burandt Anne 05/22/2014, 12:54 PM

## 2014-05-22 NOTE — Progress Notes (Signed)
I spent time with pt and family in the NICU while they were at baby's bedside. They were having a difficult time with their baby being separated from them, and they were very attentive parents to their baby. They are looking forward to having her home with them soon. I spent time listening and providing affirmation of their parenting.  Please page as needs arise.  [address]Chaplain Katy Cuartelezlaussen Pager, [phone] 4:22 PM   05/22/14 1600  Clinical Encounter Type  Visited With Patient and family together

## 2014-05-22 NOTE — H&P (Signed)
Attestation of Attending Supervision of Advanced Practitioner (PA/CNM/NP): Evaluation and management procedures were performed by the Advanced Practitioner under my supervision and collaboration.  I have reviewed the Advanced Practitioner's note and chart, and I agree with the management and plan.  Makynzie Dobesh, DO Attending Physician Faculty Practice, [facility]  

## 2014-05-22 NOTE — Addendum Note (Signed)
Addendum created 05/22/14 [id number] by Orlie Pollenebra R Kaija Kovacevic, CRNA   Modules edited: Charges VN, Notes Section   Notes Section:  File: [id number]

## 2014-05-22 NOTE — Progress Notes (Signed)
Pt and FOB to NICU via WC to visit infant

## 2014-05-22 NOTE — Progress Notes (Signed)
UR chart review completed.  

## 2014-05-22 NOTE — Progress Notes (Signed)
Post Partum Day 1 Subjective: voiding, tolerating PO, + flatus and fevers. Some abdominal pain.  Objective: Blood pressure 90/72, pulse 96, temperature 100.1 F (37.8 C), temperature source Oral, resp. rate 18, height 5\' 1"  (1.549 m), weight 108.41 kg (239 lb), last menstrual period 08/18/2013, SpO2 100.00%, unknown if currently breastfeeding.  Physical Exam:  General: alert, cooperative and no distress Lochia: appropriate Abdomen: mildly tender palpation of uterus, no rebound. Uterine Fundus: firm DVT Evaluation: No evidence of DVT seen on physical exam. Negative Homan's sign. No cords or calf tenderness. No significant calf/ankle edema.   Recent Labs  05/21/14 0640 05/22/14 0545  HGB 11.0* 9.6*  HCT 32.3* 28.9*    Assessment/Plan: Plan for discharge tomorrow, Breastfeeding and Contraception nexplanon Chorio-->endometritis, continue gent/clindamycin (received x 2 doses). Last fever 05/21/14 at 1910 102.70F, overnight 98.34F-100.75F   LOS: 1 day   Jodi Terrell, Jodi Terrell 05/22/2014, 7:49 AM

## 2014-05-22 NOTE — Anesthesia Postprocedure Evaluation (Signed)
  Anesthesia Post-op Note  Patient: Jodi Terrell  Procedure(s) Performed: * No procedures listed *  Patient Location: PACU and Mother/Baby  Anesthesia Type:Epidural  Level of Consciousness: awake, alert , oriented and patient cooperative  Airway and Oxygen Therapy: Patient Spontanous Breathing  Post-op Pain: none  Post-op Assessment: Post-op Vital signs reviewed, Patient's Cardiovascular Status Stable, Respiratory Function Stable, Patent Airway, No signs of Nausea or vomiting, Adequate PO intake, Pain level controlled, No headache, No backache, No residual numbness and No residual motor weakness  Post-op Vital Signs: Reviewed and stable  Last Vitals:  Filed Vitals:   05/22/14 0548  BP:   Pulse:   Temp: 37.8 C  Resp:     Complications: No apparent anesthesia complications

## 2014-05-23 MED ORDER — ETONOGESTREL 68 MG ~~LOC~~ IMPL
68.0000 mg | DRUG_IMPLANT | Freq: Once | SUBCUTANEOUS | Status: DC
  Filled 2014-05-23: qty 1

## 2014-05-23 MED ORDER — DOCUSATE SODIUM 100 MG PO CAPS: 100.0000 mg | ORAL_CAPSULE | Freq: Two times a day (BID) | ORAL | Status: DC

## 2014-05-23 MED ORDER — LIDOCAINE HCL (PF) 1 % IJ SOLN
5.0000 mL | Freq: Once | INTRAMUSCULAR | Status: DC
  Filled 2014-05-23: qty 5

## 2014-05-23 MED ORDER — IBUPROFEN 600 MG PO TABS: 600.0000 mg | ORAL_TABLET | Freq: Four times a day (QID) | ORAL | Status: DC

## 2014-05-23 NOTE — Lactation Note (Signed)
This note was copied from the chart of Jodi Terrell. Lactation Consultation Note: called to room because mom needed more Colostrum tubes, Mom has just pumped and obtained a few cc's Reports she pumped 4 times yesterday and this is the first pumping for today. Pleased that she obtained a little more this morning. Encouraged to pump 8 times/day to promote a good milk supply. [facility] for [facility]- will use hand pump until then. Reviewed pumping rooms in the NICU and can bring her pump pieces and use DEBP there while visiting baby, No questions at present. To call prn  Patient Name: Jodi Hipolito BayleyMercedes Mcbeth Terrell'UToday's Date: 05/23/2014 Reason for consult: Follow-up assessment;NICU baby   Maternal Data Formula Feeding for Exclusion: No Has patient been taught Hand Expression?: Yes Does the patient have breastfeeding experience prior to this delivery?: No  Feeding    LATCH Score/Interventions                      Lactation Tools Discussed/Used     Consult Status Consult Status: Complete    Pamelia HoitWeeks, Vasili Fok D 05/23/2014, 10:33 AM

## 2014-05-23 NOTE — Progress Notes (Signed)
The mother of the patient had questions about patient's dizziness, pain in sternal area, and head ache.  MD notified; stated he would come and talk to patient and family.  Will continue to monitor.   Vivi MartensAshley Kipper Buch RN

## 2014-05-23 NOTE — Lactation Note (Signed)
This note was copied from the chart of Jodi Terrell. Lactation Consultation Note     Follow up consult with this mom of a term baby, in the nICU, now 7849 hours old. Mom is being discharged to home tonight, and is planning to use a hand pump until [facility]15, [facility] Mom does not have the 30$ for a [facility] loaner. I told eher to pump until she stops dripping, every 3 hours. She said she will have help wioh pumping, so both breasts can be pumped at the same time(2 hand pumps). I told mom tthat if she finds a way to loan a DEP tomorrow, to let me know. Skin to skin encouraged. i will follow this family in the nICU  Patient Name: Jodi Terrell YQMVH'QToday's Date: 05/23/2014 Reason for consult: Follow-up assessment   Maternal Data    Feeding    LATCH Score/Interventions                      Lactation Tools Discussed/Used Tools: Pump Breast pump type: Manual WIC Program: Yes (mom to get DEP on thursdaym 10/15)   Consult Status Consult Status: PRN Follow-up type: In-patient (NICU)    Alfred LevinsLee, Kimanh Templeman Anne 05/23/2014, 6:51 PM

## 2014-05-23 NOTE — Discharge Summary (Signed)
Obstetric Discharge Summary Reason for Admission: onset of labor and rupture of membranes Prenatal Procedures: none Intrapartum Procedures: spontaneous vaginal delivery Postpartum Procedures: antibiotics for chorio (delivered before abx) and endometritis Complications-Operative and Postpartum: vaginal laceration and pelvic infection Hemoglobin  Date Value Ref Range Status  05/22/2014 9.6* 12.0 - 15.0 g/dL Final     HCT  Date Value Ref Range Status  05/22/2014 28.9* 36.0 - 46.0 % Final    Physical Exam:  General: alert, cooperative and no distress CV: RRR, normal s1 and s2, no murmur Resp: CTAB, normal effort Lochia: appropriate Uterine Fundus: firm DVT Evaluation: No evidence of DVT seen on physical exam. Negative Homan's sign. No cords or calf tenderness. No significant calf/ankle edema.  Discharge Diagnoses: Term Pregnancy-delivered  Discharge Information: Date: 05/23/2014 Activity: pelvic rest Diet: routine Medications: PNV, Ibuprofen and Colace Condition: stable Instructions: refer to practice specific booklet Discharge to: home   Newborn Data: Live born female  Birth Weight: 7 lb 4.9 oz (3314 g) APGAR: 7, 8  Home with mother. Contraception: nexplanon  Jodi Terrell, Jodi Terrell 05/23/2014, 7:52 AM  OB fellow attestation Post Partum Day 2 I have seen and examined this patient and agree with above documentation in the resident's note.   Jodi GuardianMercedes C Terrell is a 18 y.o. G2P1011 s/p NSVD complicated by chorioamniotis and endometritis. Last fever two days ago.  Pt denies problems with ambulating, voiding or po intake. Pain is well controlled.  Plan for birth control is Nexplanon.  Method of Feeding: Breast  PE:  BP 115/66  Pulse 91  Temp(Src) 98.2 F (36.8 C) (Oral)  Resp 18  Ht 5\' 1"  (1.549 m)  Wt 239 lb (108.41 kg)  BMI 45.18 kg/m2  SpO2 99%  LMP 08/18/2013  Breastfeeding? Unknown Fundus firm  Plan for discharge: Today  Jodi Terrell, Jodi Gavigan, MD 8:33  AM

## 2014-05-23 NOTE — Progress Notes (Signed)
I examined pt and agree with documentation above and resident plan of care. Johni Narine N Karim, CNM  

## 2014-05-23 NOTE — Progress Notes (Signed)
PROCEDURE NOTE: Nexplanon insertion Patient given informed consent, signed copy in the chart.  Appropriate time out taken  The patient's left arm was prepped and draped in the usual sterile fashion with iodine swabs. The ruler used to measure and mark the insertion area 8 cm from medial epicondyle of the elbow. Local anaesthesia obtained using 7 cc of 1% lidocaine. Nexplanon was inserted per manufacturer's directions. Less than 1 cc blood loss. The insertion site covered with bandaid and a pressure bandage to minimize bruising. There were no complications and the patient tolerated the procedure well.  Device information was given in handout form. Patient is informed the removal date will be in three years and package insert card filled out and given to her.  Procedure performed alongside Dr. Janene HarveyAcosta  Jodi Zahradnik, MD 05/23/2014, 11:20 AM PGY-2, [facility]

## 2014-05-25 ENCOUNTER — Ambulatory Visit: Payer: Self-pay

## 2014-05-25 NOTE — Lactation Note (Signed)
This note was copied from the chart of Jodi Terrell. Lactation Consultation Note    Brief follow up consult with this mom of a NICU baby, term and now 7190 hours old. Mom pumped with a hand pump last night, and expressed 45-60 mls times 3. [facility] Mom happy that baby is doing better, and knows to call for lactation prn.  Patient Name: Jodi Jodi Terrell VHQIO'NToday's Date: 05/25/2014 Reason for consult: Follow-up assessment;NICU baby   Maternal Data    Feeding Feeding Type: Formula Nipple Type: Slow - flow Length of feed: 10 min  LATCH Score/Interventions                      Lactation Tools Discussed/Used WIC Program:  (mom getting dep today)   Consult Status Consult Status: PRN Follow-up type: In-patient (nicu)    Alfred LevinsLee, Rayvin Abid Anne 05/25/2014, 12:04 PM

## 2014-05-28 ENCOUNTER — Ambulatory Visit: Payer: Self-pay

## 2014-05-28 NOTE — Lactation Note (Addendum)
This note was copied from the chart of Jodi [facility]. Lactation Consultation Note  Patient Name: Jodi Hipolito BayleyMercedes Durden- Baby rooming in and preparing for D/C today  Today's Date: 05/28/2014 Reason for consult: Follow-up assessment;NICU baby (rooming in #209 , 1st time to the breast ) Per mom has been pumping every 3 hours , and volume =30  To 60 ml at a pumping. It has been 3 1/2 hours since last feeding. Baby awake and rooting , LC changed a heavy wet diaper. Placed baby skin to skin, and baby latched for 5 mins in football position. Baby  unable to sustain latch,  ( probably due to the baby having feedings with a bottle). LC discussed with mom and dad the need for a Nipple Shield, With latching, both consented. LC sized mom , #24  Nipple shield excellent fit, also instilled formula into the top of the Nipple shield. Baby latched with ease , multiply swallows, and breast milk noted in the nipple shield when the baby finished. Baby latched with depth and flanged lips. LC discussed with mom and dad, since the baby has had many bottles this transition may take some time and until she comes in for a F/U LC O/P apt to continue  Feeding at the breast , using the curved tip for an appetizer, supplement with a broad based nipple after feeding at the breast, and pump[ both breast for 10 -15 mins. Save milk , and supplement baby with it. If EBM not available use formula.  Per mom has been using #21 flange with pumping and experiencing discomfort, LC assessed  Nipples and felt mom needed to increase flange to #24 for more stimulation and comfort. LC reviewed the importance of consistency with pumping and giving baby practice at the breast.  LC felt it would be important to supplement and meet the baby's nutritional needs , [facility] milk supply and transfer of milk will be evaluated.  Mom consented to a LC O/P apt on Wednesday 10/21 at 230 pm and O/P apt reminder with information  given to mom.  Mom able to apply the nipple shield #24 to both nipples , and does it well, also aware of how to use the curved tip syringe. ( also cleaning both) .       Maternal Data    Feeding Feeding Type: Breast Milk Nipple Type: Regular Length of feed: 45 min (25" BF/20 " PO)  LATCH Score/Interventions Latch: Grasps breast easily, tongue down, lips flanged, rhythmical sucking. (added a #42 NS , good fit , instilled formula in the top )  Audible Swallowing: Spontaneous and intermittent  Type of Nipple: Everted at rest and after stimulation  Comfort (Breast/Nipple): Filling, red/small blisters or bruises, mild/mod discomfort     Hold (Positioning): Assistance needed to correctly position infant at breast and maintain latch. Intervention(s): Breastfeeding basics reviewed;Support Pillows;Position options;Skin to skin  LATCH Score: 8  Lactation Tools Discussed/Used WIC Program: Yes   Consult Status Consult Status: Follow-up Date: 05/31/14 (230 pm , mom has an apt card ) Follow-up type: In-patient    Kathrin Greathouseorio, Kynzi Levay Ann 05/28/2014, 3:32 PM

## 2014-05-31 ENCOUNTER — Ambulatory Visit (HOSPITAL_COMMUNITY)
Admission: RE | Admit: 2014-05-31 | Discharge: 2014-05-31 | Disposition: A | Discharge status: Home / Self Care | Payer: Medicaid Other | Source: Ambulatory Visit | Attending: Obstetrics & Gynecology | Admitting: Obstetrics & Gynecology

## 2014-05-31 NOTE — Lactation Note (Signed)
Lactation Consult  Mother's reason for visit:  F/U Lactation appointment after D/C from NICU on 05/28/14 Visit Type:  Outpatient - Feeding Assessment Appointment Notes:  SVB on 05/22/11, admitted to NICU for Meconium aspiration. D/C on Sunday 05/28/14. 1st time at the breast was day of D/C. LC initiated #24 nipple shield that day to help with latch. Mom has been using the nipple shield to latch at home and reports observing breast milk in the nipple shield with feedings. She is supplementing prn if baby does not seem satisfied after breastfeeding. She has Lactina pump from [facility], Mom is pumping when breasts feel over full to "release the milk".  Consult:  Initial Lactation Consultant:  Jodi Terrell, Jodi Terrell  ________________________________________________________________________  Jodi FloresBaby's Name: Jodi GivenBrenda Terrell  Date of Birth: 05/21/2015  [facility]  Gender: female  Gestational Age: 1926w3d (At Birth)  Birth Weight: 7 lb 4.9 oz (3314 g)  Weight at Discharge: Weight: 7 lb 1.4 oz (3216 g) Date of Discharge: 05/28/2014  [facility]   05/26/14 0245 05/26/14 1700 05/27/14 1830  Weight: 7 lb 3.3 oz (3270 g) 7 lb 1.4 oz (3215 g) 7 lb 1.4 oz (3216 g)  Last weight taken from location outside of [facility]: 05/30/14 - 7 lb. 0 oz. Per Mom Location:Pediatrician's office  Weight today: 7 lb. 0.6 oz/3192 gm, Baby now 239 days old   ________________________________________________________________________  Mother's Name: Jodi Terrell Type of delivery:  SVB Breastfeeding Experience:  P1 Maternal Medical Conditions:  Denies History Maternal Medications:  PNV, Mom reports had Implanon implant on Tuesday, 10/131/5, 2 days Post partum  ________________________________________________________________________  Breastfeeding History (Post Discharge)  Frequency of breastfeeding:  Every 3 hours Duration of feeding:  30 minutes when breastfeeding from both breasts.    Supplementation  Formula:  Volume 30ml or less Frequency:  prn Total volume per day:  Mom only supplements after baby breastfeeds if baby does not seem satisfied. Mom reports 1-2 times day 30 ml or less.        Brand: Similac  Breastmilk:  Volume 60-90 ml Frequency:  1 time at night Total volume per day:  60-90 ml  Method:  Bottle  [facility] when breasts feel full. Mom reports obtaining 60 ml or more.   Infant Intake and Output Assessment  Voids:  5-6 in 24 hrs.  Color:  Clear yellow Stools:  10+ in 24 hrs.  Color:  Yellow  ________________________________________________________________________  Maternal Breast Assessment  Breast:  Soft, right breast slightly full Nipple:  Erect Pain level:  0 Pain interventions:  N/A  _______________________________________________________________________ Feeding Assessment/Evaluation  Initial feeding assessment:  Infant's oral assessment:  WNL  Positioning:  Football Right breast  LATCH documentation:  Latch:  2 = Grasps breast easily, tongue down, lips flanged, rhythmical sucking.  Audible swallowing:  1 = A few with stimulation  Type of nipple:  2 = Everted at rest and after stimulation  Comfort (Breast/Nipple):  2 = Soft / non-tender  Hold (Positioning):  1 = Assistance needed to correctly position infant at breast and maintain latch  LATCH score:  8  Attached assessment:  Deep  Lips flanged:  Yes.  Lower lip originally tucked. Demonstrated to Mom how to flange lips and how to latch to help bottom lip stay down.   Lips untucked:  Yes.    Suck assessment:  Nutritive  Tools:  N/A Instructed on use and cleaning of tool:  N/A  Pre-feed weight:  3192 g  (7 lb. 0.6 oz.) Post-feed weight:  3234 g (7 lb. 2.1 oz.) Amount transferred:  42 ml Nursing on the right breast without the nipple shield Amount supplemented:  0 ml  Additional Feeding Assessment -   Infant's oral assessment:   WNL  Positioning:  Football Left breast  LATCH documentation:  Latch:  2 = Grasps breast easily, tongue down, lips flanged, rhythmical sucking.  Audible swallowing:  1 = A few with stimulation  Type of nipple:  2 = Everted at rest and after stimulation  Comfort (Breast/Nipple):  2 = Soft / non-tender  Hold (Positioning):  1 = Assistance needed to correctly position infant at breast and maintain latch  LATCH score:  8  Attached assessment:  Deep  Lips flanged:  Yes.    Lips untucked:  Yes.    Suck assessment:  Nutritive  Tools:  None used Instructed on use and cleaning of tool:  None used  Pre-feed weight:  3234 g  (7 lb. 2.1 oz.) Post-feed weight:  3260 g (7 lb. 3.0 oz.) Amount transferred:  26 ml With nursing on left breast without nipple shield for 13 minutes Amount supplemented:  0 ml   Total amount pumped post feed:  Mom pumped to check flanges, changed to size 24 for comfort. She received approx 10 ml of EBM over 2-3 minutes.   Total amount transferred:  68 ml Total supplement Terrell:  0 ml  Mom reports she feels comfortable latching baby without the nipple shield. Reviewed how to obtain good depth with latching baby and how to keep bottom lip down. Encouraged Mom to BF with feeding ques, at least 8-12 times in 24 hours. Keep baby actively nursing for 15-20 minutes both breasts with each feeding, demonstrated how to stimulate baby to keep awake at breast. Mom to post pump 4 times/day and give EBM back to baby to help with weight gain. Use size 24 flange. Offered OP f/u, Mom will call if needed. Peds f/u next week.

## 2014-06-12 ENCOUNTER — Encounter (HOSPITAL_COMMUNITY): Payer: Self-pay

## 2014-06-29 ENCOUNTER — Ambulatory Visit (INDEPENDENT_AMBULATORY_CARE_PROVIDER_SITE_OTHER): Payer: Medicaid Other | Admitting: Family Medicine

## 2014-06-29 NOTE — Patient Instructions (Signed)
Postpartum Depression and Baby Blues The postpartum period begins right after the birth of a baby. During this time, there is often a great amount of joy and excitement. It is also a time of many changes in the life of the parents. Regardless of how many times a mother gives birth, each child brings new challenges and dynamics to the family. It is not unusual to have feelings of excitement along with confusing shifts in moods, emotions, and thoughts. All mothers are at risk of developing postpartum depression or the "baby blues." These mood changes can occur right after giving birth, or they may occur many months after giving birth. The baby blues or postpartum depression can be mild or severe. Additionally, postpartum depression can go away rather quickly, or it can be a long-term condition.  CAUSES Raised hormone levels and the rapid drop in those levels are thought to be a main cause of postpartum depression and the baby blues. A number of hormones change during and after pregnancy. Estrogen and progesterone usually decrease right after the delivery of your baby. The levels of thyroid hormone and various cortisol steroids also rapidly drop. Other factors that play a role in these mood changes include major life events and genetics.  RISK FACTORS If you have any of the following risks for the baby blues or postpartum depression, know what symptoms to watch out for during the postpartum period. Risk factors that may increase the likelihood of getting the baby blues or postpartum depression include:  Having a personal or family history of depression.   Having depression while being pregnant.   Having premenstrual mood issues or mood issues related to oral contraceptives.  Having a lot of life stress.   Having marital conflict.   Lacking a social support network.   Having a baby with special needs.   Having health problems, such as diabetes.  SIGNS AND SYMPTOMS Symptoms of baby blues  include:  Brief changes in mood, such as going from extreme happiness to sadness.  Decreased concentration.   Difficulty sleeping.   Crying spells, tearfulness.   Irritability.   Anxiety.  Symptoms of postpartum depression typically begin within the first month after giving birth. These symptoms include:  Difficulty sleeping or excessive sleepiness.   Marked weight loss.   Agitation.   Feelings of worthlessness.   Lack of interest in activity or food.  Postpartum psychosis is a very serious condition and can be dangerous. Fortunately, it is rare. Displaying any of the following symptoms is cause for immediate medical attention. Symptoms of postpartum psychosis include:   Hallucinations and delusions.   Bizarre or disorganized behavior.   Confusion or disorientation.  DIAGNOSIS  A diagnosis is made by an evaluation of your symptoms. There are no medical or lab tests that lead to a diagnosis, but there are various questionnaires that a health care provider may use to identify those with the baby blues, postpartum depression, or psychosis. Often, a screening tool called the Edinburgh Postnatal Depression Scale is used to diagnose depression in the postpartum period.  TREATMENT The baby blues usually goes away on its own in 1-2 weeks. Social support is often all that is needed. You will be encouraged to get adequate sleep and rest. Occasionally, you may be given medicines to help you sleep.  Postpartum depression requires treatment because it can last several months or longer if it is not treated. Treatment may include individual or group therapy, medicine, or both to address any social, physiological, and psychological   factors that may play a role in the depression. Regular exercise, a healthy diet, rest, and social support may also be strongly recommended.  Postpartum psychosis is more serious and needs treatment right away. Hospitalization is often needed. HOME CARE  INSTRUCTIONS  Get as much rest as you can. Nap when the baby sleeps.   Exercise regularly. Some women find yoga and walking to be beneficial.   Eat a balanced and nourishing diet.   Do little things that you enjoy. Have a cup of tea, take a bubble bath, read your favorite magazine, or listen to your favorite music.  Avoid alcohol.   Ask for help with household chores, cooking, grocery shopping, or running errands as needed. Do not try to do everything.   Talk to people close to you about how you are feeling. Get support from your partner, family members, friends, or other new moms.  Try to stay positive in how you think. Think about the things you are grateful for.   Do not spend a lot of time alone.   Only take over-the-counter or prescription medicine as directed by your health care provider.  Keep all your postpartum appointments.   Let your health care provider know if you have any concerns.  SEEK MEDICAL CARE IF: You are having a reaction to or problems with your medicine. SEEK IMMEDIATE MEDICAL CARE IF:  You have suicidal feelings.   You think you may harm the baby or someone else. MAKE SURE YOU:  Understand these instructions.  Will watch your condition.  Will get help right away if you are not doing well or get worse. Document Released: 05/01/2004 Document Revised: 08/02/2013 Document Reviewed: 05/09/2013 ExitCare Patient Information 2015 [employer]. This information is not intended to replace advice given to you by your health care provider. Make sure you discuss any questions you have with your health care provider.  

## 2014-06-29 NOTE — Progress Notes (Signed)
  Subjective:     Jodi Terrell is a 18 y.o. female who presents for a postpartum visit. She is 5 weeks postpartum following a spontaneous vaginal delivery. I have fully reviewed the prenatal and intrapartum course. The delivery was at 39 gestational weeks. Outcome: spontaneous vaginal delivery. Anesthesia: epidural. Postpartum course has been normal. Baby's course has been normal. Baby is feeding by bottle. Bleeding no bleeding. Bowel function is normal. Bladder function is normal. Patient is not sexually active. Contraception method is Nexplanon. Postpartum depression screening: negative.  The following portions of the patient's history were reviewed and updated as appropriate: allergies, current medications, past family history, past medical history, past social history, past surgical history and problem list.  Review of Systems Pertinent items are noted in HPI.   Objective:    BP 100/53 mmHg  Pulse 79  Temp(Src) 98.2 F (36.8 C)  Wt 212 lb 9.6 oz (96.435 kg)  Breastfeeding? No  General:  alert, cooperative and no distress  Lungs: clear to auscultation bilaterally and normal percussion bilaterally  Heart:  regular rate and rhythm, S1, S2 normal, no murmur, click, rub or gallop  Abdomen: soft, non-tender; bowel sounds normal; no masses,  no organomegaly   Vulva:  normal  Vagina: normal vagina, no discharge, exudate, lesion, or erythema        Assessment:     Normal postpartum exam. Pap smear not done at today's visit.   Plan:    1. Contraception: Nexplanon 2. Follow up in: 1 year or as needed.

## 2014-07-25 ENCOUNTER — Encounter: Payer: Self-pay | Admitting: *Deleted

## 2014-08-18 ENCOUNTER — Emergency Department (HOSPITAL_COMMUNITY)
Admission: EM | Admit: 2014-08-18 | Discharge: 2014-08-18 | Disposition: A | Discharge status: Home / Self Care | Payer: No Typology Code available for payment source | Attending: Emergency Medicine | Admitting: Emergency Medicine

## 2014-08-18 ENCOUNTER — Emergency Department (HOSPITAL_COMMUNITY): Payer: No Typology Code available for payment source

## 2014-08-18 ENCOUNTER — Encounter (HOSPITAL_COMMUNITY): Payer: Self-pay | Admitting: *Deleted

## 2014-08-18 DIAGNOSIS — Y9389 Activity, other specified: Secondary | ICD-10-CM | POA: Insufficient documentation

## 2014-08-18 DIAGNOSIS — Y9289 Other specified places as the place of occurrence of the external cause: Secondary | ICD-10-CM | POA: Insufficient documentation

## 2014-08-18 DIAGNOSIS — Z87891 Personal history of nicotine dependence: Secondary | ICD-10-CM | POA: Insufficient documentation

## 2014-08-18 DIAGNOSIS — S022XXA Fracture of nasal bones, initial encounter for closed fracture: Secondary | ICD-10-CM | POA: Insufficient documentation

## 2014-08-18 DIAGNOSIS — Z8679 Personal history of other diseases of the circulatory system: Secondary | ICD-10-CM | POA: Insufficient documentation

## 2014-08-18 DIAGNOSIS — Z79899 Other long term (current) drug therapy: Secondary | ICD-10-CM | POA: Insufficient documentation

## 2014-08-18 DIAGNOSIS — Y998 Other external cause status: Secondary | ICD-10-CM | POA: Insufficient documentation

## 2014-08-18 DIAGNOSIS — Z8701 Personal history of pneumonia (recurrent): Secondary | ICD-10-CM | POA: Insufficient documentation

## 2014-08-18 MED ORDER — OXYCODONE-ACETAMINOPHEN 5-325 MG PO TABS: 2.0000 | ORAL_TABLET | ORAL | Status: DC | PRN

## 2014-08-18 MED ORDER — AMOXICILLIN-POT CLAVULANATE 875-125 MG PO TABS: 1.0000 | ORAL_TABLET | Freq: Two times a day (BID) | ORAL | Status: DC

## 2014-08-18 MED ORDER — OXYCODONE-ACETAMINOPHEN 5-325 MG PO TABS
2.0000 | ORAL_TABLET | Freq: Once | ORAL | Status: AC
Start: 2014-08-18 — End: 2014-08-18
  Administered 2014-08-18: 2 via ORAL
  Filled 2014-08-18: qty 2

## 2014-08-18 MED ORDER — METHYLPREDNISOLONE SODIUM SUCC 125 MG IJ SOLR
INTRAMUSCULAR | Status: AC
  Filled 2014-08-18: qty 2
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
## 2014-08-18 NOTE — Discharge Instructions (Signed)
Nasal Fracture A nasal fracture is a break or crack in the bones of the nose. A minor break usually heals in a month. You often will receive black eyes from a nasal fracture. This is not a cause for concern. The black eyes will go away over 1 to 2 weeks.  DIAGNOSIS  Your caregiver may want to examine you if you are concerned about a fracture of the nose. X-rays of the nose may not show a nasal fracture even when one is present. Sometimes your caregiver must wait 1 to 5 days after the injury to re-check the nose for alignment and to take additional X-rays. Sometimes the caregiver must wait until the swelling has gone down. TREATMENT Minor fractures that have caused no deformity often do not require treatment. More serious fractures where bones are displaced may require surgery. This will take place after the swelling is gone. Surgery will stabilize and align the fracture. HOME CARE INSTRUCTIONS   Put ice on the injured area.  Put ice in a plastic bag.  Place a towel between your skin and the bag.  Leave the ice on for 15-20 minutes, 03-04 times a day.  Take medications as directed by your caregiver.  Only take over-the-counter or prescription medicines for pain, discomfort, or fever as directed by your caregiver.  If your nose starts bleeding, squeeze the soft parts of the nose against the center wall while you are sitting in an upright position for 10 minutes.  Contact sports should be avoided for at least 3 to 4 weeks or as directed by your caregiver. SEEK MEDICAL CARE IF:  Your pain increases or becomes severe.  You continue to have nosebleeds.  The shape of your nose does not return to normal within 5 days.  You have pus draining from the nose. SEEK IMMEDIATE MEDICAL CARE IF:   You have bleeding from your nose that does not stop after 20 minutes of pinching the nostrils closed and keeping ice on the nose.  You have clear fluid draining from your nose.  You notice a grape-like  swelling on the dividing wall between the nostrils (septum). This is a collection of blood (hematoma) that must be drained to help prevent infection.  You have difficulty moving your eyes.  You have recurrent vomiting. Document Released: 07/25/2000 Document Revised: 10/20/2011 Document Reviewed: 11/11/2010 ExitCare Patient Information 2015 [employer]. This information is not intended to replace advice given to you by your health care provider. Make sure you discuss any questions you have with your health care provider.  
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
## 2014-08-31 ENCOUNTER — Encounter (HOSPITAL_COMMUNITY): Payer: Self-pay | Admitting: *Deleted

## 2014-08-31 MED ORDER — CEFAZOLIN SODIUM-DEXTROSE 2-3 GM-% IV SOLR
2.0000 g | INTRAVENOUS | Status: AC
  Filled 2014-08-31: qty 50

## 2014-09-01 ENCOUNTER — Encounter (HOSPITAL_BASED_OUTPATIENT_CLINIC_OR_DEPARTMENT_OTHER): Admission: RE | Payer: Self-pay | Source: Ambulatory Visit

## 2014-09-01 ENCOUNTER — Encounter (HOSPITAL_COMMUNITY): Payer: Self-pay | Admitting: *Deleted

## 2014-09-01 ENCOUNTER — Encounter (HOSPITAL_COMMUNITY)
Admission: RE | Disposition: A | Discharge status: Home / Self Care | Payer: Self-pay | Source: Ambulatory Visit | Attending: Plastic Surgery

## 2014-09-01 ENCOUNTER — Ambulatory Visit (HOSPITAL_COMMUNITY): Payer: No Typology Code available for payment source | Admitting: Certified Registered"

## 2014-09-01 ENCOUNTER — Ambulatory Visit (HOSPITAL_COMMUNITY)
Admission: RE | Admit: 2014-09-01 | Discharge: 2014-09-01 | Disposition: A | Discharge status: Home / Self Care | Payer: No Typology Code available for payment source | Source: Ambulatory Visit | Attending: Plastic Surgery | Admitting: Plastic Surgery

## 2014-09-01 ENCOUNTER — Ambulatory Visit (HOSPITAL_BASED_OUTPATIENT_CLINIC_OR_DEPARTMENT_OTHER): Admission: RE | Admit: 2014-09-01 | Payer: Medicaid Other | Source: Ambulatory Visit | Admitting: Plastic Surgery

## 2014-09-01 DIAGNOSIS — Y939 Activity, unspecified: Secondary | ICD-10-CM | POA: Insufficient documentation

## 2014-09-01 DIAGNOSIS — Y999 Unspecified external cause status: Secondary | ICD-10-CM | POA: Insufficient documentation

## 2014-09-01 DIAGNOSIS — S022XXA Fracture of nasal bones, initial encounter for closed fracture: Secondary | ICD-10-CM | POA: Insufficient documentation

## 2014-09-01 DIAGNOSIS — Y929 Unspecified place or not applicable: Secondary | ICD-10-CM | POA: Insufficient documentation

## 2014-09-01 DIAGNOSIS — Z87891 Personal history of nicotine dependence: Secondary | ICD-10-CM | POA: Insufficient documentation

## 2014-09-01 HISTORY — DX: Cardiac murmur, unspecified: R01.1

## 2014-09-01 HISTORY — PX: CLOSED REDUCTION NASAL FRACTURE: SHX5365

## 2014-09-01 LAB — BASIC METABOLIC PANEL
Anion gap: 11 (ref 5–15)
BUN: 8 mg/dL (ref 6–23)
CO2: 23 mmol/L (ref 19–32)
Calcium: 9.1 mg/dL (ref 8.4–10.5)
Chloride: 102 mEq/L (ref 96–112)
Creatinine, Ser: 0.68 mg/dL (ref 0.50–1.10)
GFR calc non Af Amer: >90 mL/min (ref >90)
Glucose, Bld: 90 mg/dL (ref 70–99)
POTASSIUM: 3.9 mmol/L (ref 3.5–5.1)
SODIUM: 136 mmol/L (ref 135–145)

## 2014-09-01 LAB — CBC
HCT: 40.1 % (ref 36.0–46.0)
Hemoglobin: 13.2 g/dL (ref 12.0–15.0)
MCH: 26.2 pg (ref 26.0–34.0)
MCHC: 32.9 g/dL (ref 30.0–36.0)
MCV: 79.7 fL (ref 78.0–100.0)
PLATELETS: 278 10*3/uL (ref 150–400)
RBC: 5.03 MIL/uL (ref 3.87–5.11)
RDW: 13.7 % (ref 11.5–15.5)
WBC: 8.8 10*3/uL (ref 4.0–10.5)

## 2014-09-01 LAB — HCG, SERUM, QUALITATIVE: Preg, Serum: NEGATIVE

## 2014-09-01 SURGERY — CLOSED REDUCTION, FRACTURE, NASAL BONE
Anesthesia: General

## 2014-09-01 SURGERY — CLOSED REDUCTION, FRACTURE, NASAL BONE
Anesthesia: General | Site: Nose

## 2014-09-01 MED ORDER — SODIUM CHLORIDE 0.9 % IJ SOLN
INTRAMUSCULAR | Status: AC
  Filled 2014-09-01: qty 10

## 2014-09-01 MED ORDER — OXYCODONE-ACETAMINOPHEN 5-325 MG PO TABS
ORAL_TABLET | ORAL | Status: AC
  Filled 2014-09-01: qty 2

## 2014-09-01 MED ORDER — OXYCODONE-ACETAMINOPHEN 5-325 MG PO TABS
2.0000 | ORAL_TABLET | Freq: Once | ORAL | Status: AC
  Administered 2014-09-01: 1 via ORAL

## 2014-09-01 MED ORDER — MIDAZOLAM HCL 2 MG/2ML IJ SOLN
INTRAMUSCULAR | Status: AC
  Filled 2014-09-01: qty 2

## 2014-09-01 MED ORDER — OXYMETAZOLINE HCL 0.05 % NA SOLN
NASAL | Status: AC
  Filled 2014-09-01: qty 15

## 2014-09-01 MED ORDER — FENTANYL CITRATE 0.05 MG/ML IJ SOLN
25.0000 ug | INTRAMUSCULAR | Status: DC | PRN
  Administered 2014-09-01 (×4): 50 ug via INTRAVENOUS

## 2014-09-01 MED ORDER — OXYMETAZOLINE HCL 0.05 % NA SOLN
NASAL | Status: DC | PRN
  Administered 2014-09-01: 1

## 2014-09-01 MED ORDER — LACTATED RINGERS IV SOLN
INTRAVENOUS | Status: DC | PRN
  Administered 2014-09-01: 07:00:00 via INTRAVENOUS

## 2014-09-01 MED ORDER — DEXAMETHASONE SODIUM PHOSPHATE 4 MG/ML IJ SOLN
INTRAMUSCULAR | Status: AC
  Filled 2014-09-01: qty 2

## 2014-09-01 MED ORDER — PROPOFOL 10 MG/ML IV BOLUS
INTRAVENOUS | Status: DC | PRN
  Administered 2014-09-01: 200 mg via INTRAVENOUS

## 2014-09-01 MED ORDER — BACITRACIN-NEOMYCIN-POLYMYXIN 400-5-5000 EX OINT
TOPICAL_OINTMENT | CUTANEOUS | Status: AC
  Filled 2014-09-01: qty 1

## 2014-09-01 MED ORDER — LIDOCAINE HCL (CARDIAC) 20 MG/ML IV SOLN
INTRAVENOUS | Status: AC
  Filled 2014-09-01: qty 5

## 2014-09-01 MED ORDER — FENTANYL CITRATE 0.05 MG/ML IJ SOLN
INTRAMUSCULAR | Status: AC
  Filled 2014-09-01: qty 2

## 2014-09-01 MED ORDER — EPHEDRINE SULFATE 50 MG/ML IJ SOLN
INTRAMUSCULAR | Status: AC
  Filled 2014-09-01: qty 1

## 2014-09-01 MED ORDER — CLINDAMYCIN HCL 300 MG PO CAPS: 300.0000 mg | ORAL_CAPSULE | Freq: Three times a day (TID) | ORAL | Status: DC

## 2014-09-01 MED ORDER — LIDOCAINE HCL (CARDIAC) 20 MG/ML IV SOLN
INTRAVENOUS | Status: DC | PRN
  Administered 2014-09-01: 80 mg via INTRAVENOUS

## 2014-09-01 MED ORDER — DEXAMETHASONE SODIUM PHOSPHATE 4 MG/ML IJ SOLN
INTRAMUSCULAR | Status: DC | PRN
  Administered 2014-09-01: 8 mg via INTRAVENOUS

## 2014-09-01 MED ORDER — BACITRACIN-NEOMYCIN-POLYMYXIN 400-5-5000 EX OINT
TOPICAL_OINTMENT | CUTANEOUS | Status: DC | PRN
  Administered 2014-09-01: 1 via TOPICAL

## 2014-09-01 MED ORDER — SUCCINYLCHOLINE CHLORIDE 20 MG/ML IJ SOLN
INTRAMUSCULAR | Status: DC | PRN
  Administered 2014-09-01: 100 mg via INTRAVENOUS

## 2014-09-01 MED ORDER — FENTANYL CITRATE 0.05 MG/ML IJ SOLN
INTRAMUSCULAR | Status: AC
  Filled 2014-09-01: qty 5

## 2014-09-01 MED ORDER — OXYCODONE-ACETAMINOPHEN 5-325 MG PO TABS: 2.0000 | ORAL_TABLET | ORAL | Status: DC | PRN

## 2014-09-01 MED ORDER — MIDAZOLAM HCL 5 MG/5ML IJ SOLN
INTRAMUSCULAR | Status: DC | PRN
  Administered 2014-09-01: 2 mg via INTRAVENOUS

## 2014-09-01 MED ORDER — SUCCINYLCHOLINE CHLORIDE 20 MG/ML IJ SOLN
INTRAMUSCULAR | Status: AC
  Filled 2014-09-01: qty 1

## 2014-09-01 MED ORDER — ROCURONIUM BROMIDE 50 MG/5ML IV SOLN
INTRAVENOUS | Status: AC
  Filled 2014-09-01: qty 1

## 2014-09-01 MED ORDER — FENTANYL CITRATE 0.05 MG/ML IJ SOLN
INTRAMUSCULAR | Status: DC | PRN
Start: 2014-09-01 — End: 2014-09-01
  Administered 2014-09-01: 100 ug via INTRAVENOUS
  Administered 2014-09-01: 50 ug via INTRAVENOUS
  Administered 2014-09-01: 100 ug via INTRAVENOUS

## 2014-09-01 MED ORDER — BUPIVACAINE-EPINEPHRINE (PF) 0.25% -1:200000 IJ SOLN
INTRAMUSCULAR | Status: AC
  Filled 2014-09-01: qty 30

## 2014-09-01 MED ORDER — PROPOFOL 10 MG/ML IV BOLUS
INTRAVENOUS | Status: AC
  Filled 2014-09-01: qty 20

## 2014-09-01 MED ORDER — ONDANSETRON HCL 4 MG/2ML IJ SOLN
INTRAMUSCULAR | Status: DC | PRN
  Administered 2014-09-01: 4 mg via INTRAVENOUS

## 2014-09-01 MED ORDER — PHENYLEPHRINE 40 MCG/ML (10ML) SYRINGE FOR IV PUSH (FOR BLOOD PRESSURE SUPPORT)
PREFILLED_SYRINGE | INTRAVENOUS | Status: AC
  Filled 2014-09-01: qty 10

## 2014-09-01 MED ORDER — FENTANYL CITRATE 0.05 MG/ML IJ SOLN
100.0000 ug | Freq: Once | INTRAMUSCULAR | Status: AC
  Administered 2014-09-01: 50 ug via INTRAVENOUS

## 2014-09-01 MED ORDER — BUPIVACAINE-EPINEPHRINE (PF) 0.25% -1:200000 IJ SOLN
INTRAMUSCULAR | Status: DC | PRN
  Administered 2014-09-01: 30 mL

## 2014-09-01 MED ORDER — PROMETHAZINE HCL 25 MG/ML IJ SOLN: 6.2500 mg | INTRAMUSCULAR | Status: DC | PRN

## 2014-09-01 SURGICAL SUPPLY — 29 items
BLADE SURG 15 STRL LF DISP TIS (BLADE) IMPLANT
BLADE SURG 15 STRL SS (BLADE)
CANISTER SUCT 1200ML W/VALVE (MISCELLANEOUS) IMPLANT
COVER MAYO STAND STRL (DRAPES) IMPLANT
COVER TABLE BACK 60X90 (DRAPES) ×3 IMPLANT
DRAPE PROXIMA HALF (DRAPES) ×3 IMPLANT
GAUZE SPONGE 2X2 8PLY STRL LF (GAUZE/BANDAGES/DRESSINGS) IMPLANT
GAUZE SPONGE 4X4 16PLY XRAY LF (GAUZE/BANDAGES/DRESSINGS) ×3 IMPLANT
GLOVE BIO SURGEON STRL SZ 6 (GLOVE) ×3 IMPLANT
GOWN BRE IMP SLV AUR LG STRL (GOWN DISPOSABLE) IMPLANT
GOWN STRL REUS W/ TWL LRG LVL3 (GOWN DISPOSABLE) ×2 IMPLANT
GOWN STRL REUS W/TWL LRG LVL3 (GOWN DISPOSABLE) ×4
KIT BASIN OR (CUSTOM PROCEDURE TRAY) ×3 IMPLANT
KIT SPLINT NASAL DENVER LRG BE (GAUZE/BANDAGES/DRESSINGS) ×3 IMPLANT
KIT SPLINT NASAL DENVER MIN BE (GAUZE/BANDAGES/DRESSINGS) IMPLANT
KIT SPLINT NASAL DENVER PET BE (GAUZE/BANDAGES/DRESSINGS) IMPLANT
KIT SPLINT NASAL DENVER SM BEI (GAUZE/BANDAGES/DRESSINGS) IMPLANT
NEEDLE 27GAX1X1/2 (NEEDLE) IMPLANT
NEEDLE HYPO 25GX1X1/2 BEV (NEEDLE) ×3 IMPLANT
PATTIES SURGICAL .5 X3 (DISPOSABLE) ×3 IMPLANT
SPLINT NASAL DOYLE BI-VL (GAUZE/BANDAGES/DRESSINGS) IMPLANT
SPLINT NASAL THERMO PLAST (MISCELLANEOUS) IMPLANT
SPONGE GAUZE 2X2 STER 10/PKG (GAUZE/BANDAGES/DRESSINGS)
SUT CHROMIC 4 0 P 3 18 (SUTURE) IMPLANT
SUT PROLENE 2 0 CT2 30 (SUTURE) ×3 IMPLANT
SYR CONTROL 10ML LL (SYRINGE) ×3 IMPLANT
TOWEL OR 17X24 6PK STRL BLUE (TOWEL DISPOSABLE) ×3 IMPLANT
TUBE CONNECTING 20'X1/4 (TUBING) ×1
TUBE CONNECTING 20X1/4 (TUBING) ×2 IMPLANT

## 2014-09-01 NOTE — Transfer of Care (Signed)
Immediate Anesthesia Transfer of Care Note  Patient: Jodi Terrell  Procedure(s) Performed: Procedure(s): CLOSED REDUCTION NASAL FRACTURE (N/A)  Patient Location: PACU  Anesthesia Type:General  Level of Consciousness: awake, alert  and oriented  Airway & Oxygen Therapy: Patient Spontanous Breathing and Patient connected to face mask oxygen  Post-op Assessment: Report given to PACU RN, Post -op Vital signs reviewed and stable and Patient moving all extremities  Post vital signs: Reviewed and stable  Complications: No apparent anesthesia complications

## 2014-09-01 NOTE — Anesthesia Postprocedure Evaluation (Signed)
  Anesthesia Post-op Note  Patient: Jodi Terrell  Procedure(s) Performed: Procedure(s) (LRB): CLOSED REDUCTION NASAL FRACTURE (N/A)  Patient Location: PACU  Anesthesia Type: General  Level of Consciousness: awake and alert   Airway and Oxygen Therapy: Patient Spontanous Breathing  Post-op Pain: mild  Post-op Assessment: Post-op Vital signs reviewed, Patient's Cardiovascular Status Stable, Respiratory Function Stable, Patent Airway and No signs of Nausea or vomiting  Last Vitals:  Filed Vitals:   09/01/14 0830  BP:   Pulse: 70  Temp:   Resp: 20    Post-op Vital Signs: stable   Complications: No apparent anesthesia complications

## 2014-09-01 NOTE — Anesthesia Procedure Notes (Signed)
Procedure Name: Intubation Date/Time: 09/01/2014 7:34 AM Performed by: Jerilee HohMUMM, Quintrell Baze N Pre-anesthesia Checklist: Patient identified, Emergency Drugs available, Patient being monitored and Suction available Patient Re-evaluated:Patient Re-evaluated prior to inductionOxygen Delivery Method: Circle system utilized Preoxygenation: Pre-oxygenation with 100% oxygen Intubation Type: IV induction Ventilation: Mask ventilation without difficulty Laryngoscope Size: Mac and 3 Grade View: Grade I Tube type: Oral Tube size: 7.0 mm Number of attempts: 1 Airway Equipment and Method: Stylet Placement Confirmation: ETT inserted through vocal cords under direct vision,  positive ETCO2 and breath sounds checked- equal and bilateral Secured at: 21 cm Tube secured with: Tape Dental Injury: Teeth and Oropharynx as per pre-operative assessment

## 2014-09-01 NOTE — Discharge Instructions (Signed)

## 2014-09-01 NOTE — Anesthesia Preprocedure Evaluation (Signed)
Anesthesia Evaluation  Patient identified by MRN, date of birth, ID band Patient awake    Reviewed: Allergy & Precautions, NPO status , Patient's Chart, lab work & pertinent test results  Airway Mallampati: II  TM Distance: >3 FB Neck ROM: Full    Dental no notable dental hx.    Pulmonary neg pulmonary ROS, former smoker,  breath sounds clear to auscultation  Pulmonary exam normal       Cardiovascular negative cardio ROS  Rhythm:Regular Rate:Normal     Neuro/Psych negative neurological ROS  negative psych ROS   GI/Hepatic negative GI ROS, Neg liver ROS,   Endo/Other  Morbid obesity  Renal/GU negative Renal ROS  negative genitourinary   Musculoskeletal negative musculoskeletal ROS (+)   Abdominal   Peds negative pediatric ROS (+)  Hematology negative hematology ROS (+)   Anesthesia Other Findings   Reproductive/Obstetrics negative OB ROS                             Anesthesia Physical Anesthesia Plan  ASA: II  Anesthesia Plan: General   Post-op Pain Management:    Induction: Intravenous  Airway Management Planned: Oral ETT and LMA  Additional Equipment:   Intra-op Plan:   Post-operative Plan: Extubation in OR  Informed Consent: I have reviewed the patients History and Physical, chart, labs and discussed the procedure including the risks, benefits and alternatives for the proposed anesthesia with the patient or authorized representative who has indicated his/her understanding and acceptance.   Dental advisory given  Plan Discussed with: CRNA and Surgeon  Anesthesia Plan Comments:         Anesthesia Quick Evaluation

## 2014-09-01 NOTE — Op Note (Signed)
Operative Note   DATE OF OPERATION: 1.22.2016  [facility]  SURGICAL DIVISION: Plastic Surgery  PREOPERATIVE DIAGNOSES:  Closed nasal fracture  POSTOPERATIVE DIAGNOSES:  same  PROCEDURE:  Closed reduction nose with stabilization  SURGEON: Glenna FellowsBrinda Johny Pitstick MD MBA  ASSISTANT: none  ANESTHESIA:  General.   EBL: 30 ml  COMPLICATIONS: None immediate.   INDICATIONS FOR PROCEDURE:  The patient, Jodi Terrell, is a 19 y.o. female born on March 05, 1996, is here for closed reduction displaced nasal fracture   FINDINGS: bilateral nasal bone fractures with displacement to left  DESCRIPTION OF PROCEDURE:  The patient's operative site was marked with the patient in the preoperative area. The patient was taken to the operating room. SCDs were placed and IV antibiotics were given. A time out was performed and all information was confirmed to be correct. The nose was treated with Afrin soaked pledgets and local anesthetic infiltrated to perform bilateral infraorbital nerve blocks and along nasal dorsum. Rowe forceps used to elevate and reduce bony pyramid. Nasopharyns suctioned and Doyle splints placed bilaterally, secured to membranous septum with 2-0 nylon. Denver splint applied to external nose.  The patient was allowed to wake from anesthesia, extubated and taken to the recovery room in satisfactory condition.   SPECIMENS: none  DRAINS: none  Glenna FellowsBrinda Christ Fullenwider, MD [facility] Pl[facility] Reconstructive Surgery [phone]

## 2014-09-01 NOTE — Interval H&P Note (Signed)
History and Physical Interval Note:  09/01/2014 6:51 AM  Jodi Terrell  has presented today for surgery, with the diagnosis of closed nasal fracture  The various methods of treatment have been discussed with the patient and family. After consideration of risks, benefits and other options for treatment, the patient has consented to  Procedure(s): CLOSED REDUCTION NASAL FRACTURE (N/A) as a surgical intervention .  The patient's history has been reviewed, patient examined, no change in status, stable for surgery.  I have reviewed the patient's chart and labs.  Questions were answered to the patient's satisfaction.     Earle Troiano

## 2014-09-04 ENCOUNTER — Encounter (HOSPITAL_COMMUNITY): Payer: Self-pay | Admitting: Plastic Surgery

## 2015-01-30 ENCOUNTER — Emergency Department (HOSPITAL_COMMUNITY)
Admission: EM | Admit: 2015-01-30 | Discharge: 2015-01-30 | Disposition: A | Discharge status: Home / Self Care | Payer: No Typology Code available for payment source | Attending: Emergency Medicine | Admitting: Emergency Medicine

## 2015-01-30 ENCOUNTER — Encounter (HOSPITAL_COMMUNITY): Payer: Self-pay | Admitting: Emergency Medicine

## 2015-01-30 ENCOUNTER — Emergency Department (HOSPITAL_COMMUNITY): Payer: No Typology Code available for payment source

## 2015-01-30 DIAGNOSIS — R0789 Other chest pain: Secondary | ICD-10-CM | POA: Insufficient documentation

## 2015-01-30 DIAGNOSIS — Z3202 Encounter for pregnancy test, result negative: Secondary | ICD-10-CM | POA: Insufficient documentation

## 2015-01-30 DIAGNOSIS — R011 Cardiac murmur, unspecified: Secondary | ICD-10-CM | POA: Insufficient documentation

## 2015-01-30 DIAGNOSIS — Z8701 Personal history of pneumonia (recurrent): Secondary | ICD-10-CM | POA: Diagnosis not present

## 2015-01-30 DIAGNOSIS — M25512 Pain in left shoulder: Secondary | ICD-10-CM | POA: Insufficient documentation

## 2015-01-30 DIAGNOSIS — R42 Dizziness and giddiness: Secondary | ICD-10-CM | POA: Diagnosis not present

## 2015-01-30 DIAGNOSIS — R079 Chest pain, unspecified: Secondary | ICD-10-CM | POA: Diagnosis present

## 2015-01-30 DIAGNOSIS — R55 Syncope and collapse: Secondary | ICD-10-CM | POA: Insufficient documentation

## 2015-01-30 DIAGNOSIS — Z87891 Personal history of nicotine dependence: Secondary | ICD-10-CM | POA: Diagnosis not present

## 2015-01-30 DIAGNOSIS — M549 Dorsalgia, unspecified: Secondary | ICD-10-CM | POA: Diagnosis not present

## 2015-01-30 DIAGNOSIS — Z8679 Personal history of other diseases of the circulatory system: Secondary | ICD-10-CM | POA: Insufficient documentation

## 2015-01-30 DIAGNOSIS — R0781 Pleurodynia: Secondary | ICD-10-CM

## 2015-01-30 DIAGNOSIS — R002 Palpitations: Secondary | ICD-10-CM | POA: Insufficient documentation

## 2015-01-30 LAB — BASIC METABOLIC PANEL
Anion gap: 5 (ref 5–15)
BUN: 8 mg/dL (ref 6–20)
CALCIUM: 9.3 mg/dL (ref 8.9–10.3)
CHLORIDE: 105 mmol/L (ref 101–111)
CO2: 28 mmol/L (ref 22–32)
CREATININE: 0.52 mg/dL (ref 0.44–1.00)
GFR calc non Af Amer: >60 mL/min (ref >60)
Glucose, Bld: 98 mg/dL (ref 65–99)
Potassium: 4.2 mmol/L (ref 3.5–5.1)
Sodium: 138 mmol/L (ref 135–145)

## 2015-01-30 LAB — I-STAT TROPONIN, ED: Troponin i, poc: 0 ng/mL (ref 0.00–0.08)

## 2015-01-30 LAB — D-DIMER, QUANTITATIVE: D-Dimer, Quant: <0.27 ug/mL-FEU (ref 0.00–0.48)

## 2015-01-30 LAB — CBC
HCT: 38.2 % (ref 36.0–46.0)
Hemoglobin: 12.1 g/dL (ref 12.0–15.0)
MCH: 25.3 pg — AB (ref 26.0–34.0)
MCHC: 31.7 g/dL (ref 30.0–36.0)
MCV: 79.7 fL (ref 78.0–100.0)
PLATELETS: 362 10*3/uL (ref 150–400)
RBC: 4.79 MIL/uL (ref 3.87–5.11)
RDW: 14.3 % (ref 11.5–15.5)
WBC: 12 10*3/uL — AB (ref 4.0–10.5)

## 2015-01-30 LAB — HCG, SERUM, QUALITATIVE: Preg, Serum: NEGATIVE

## 2015-01-30 MED ORDER — IBUPROFEN 600 MG PO TABS: 600.0000 mg | ORAL_TABLET | Freq: Three times a day (TID) | ORAL | Status: DC

## 2015-01-30 NOTE — ED Notes (Signed)
Patient alert and oriented at discharge.  Patient verbalized understanding of discharge and the need for follow up care.  Patient ambulatory to the waiting room with this RN and friend at bedside.

## 2015-01-30 NOTE — Discharge Instructions (Signed)

## 2015-01-30 NOTE — ED Notes (Signed)
Pt reports cp x 2-3 weeks that radiates to left arm and neck; pt reports pain is worse with lying flat and deep inspiration; pt also reports dizziness, lightheadedness and near-syncope; pt denies n/v/d and diaphoresis; pt CAOx4 at this time; pt ambulatory to room 1

## 2015-01-30 NOTE — ED Provider Notes (Signed)
CSN: [id number]     Arrival date & time 01/30/15  0307 History   None    This chart was scribed for Loren Racer, MD by Arlan Organ, ED Scribe. This patient was seen in room A01C/A01C and the patient's care was started 3:26 AM.   Chief Complaint  Patient presents with  . Chest Pain  . Palpitations   The history is provided by the patient. No language interpreter was used.    HPI Comments: Jodi Terrell is a 19 y.o. female with a PMHx of arrhythmia and heart murmur who presents to the Emergency Department complaining of intermittent, ongoing, unchanged L shoulder pain that radiates into the chest, back, and neck x 2-3 weeks. Pain is described as achy and made worse with movement/with deep inspiration. No alleviating factors at this time. Pt also reports dizziness, lightheadedness, and near syncope. No OTC medications or home remedies attempted prior to arrival. No recent fever, chills, cough, nausea, vomiting, tinnitus, or diarrhea. No recent long distance travel. No recent surgeries. She denies any personal or family history of blood clots. No known allergies to medications.  Past Medical History  Diagnosis Date  . Seasonal allergies   . Arrhythmia     with pneumonia  . Heart murmur     as a child  . Pneumonia     09/01/14- 3- 4 years ago   Past Surgical History  Procedure Laterality Date  . Tympanostomy tube placement    . Closed reduction nasal fracture N/A 09/01/2014    Procedure: CLOSED REDUCTION NASAL FRACTURE;  Surgeon: Glenna Fellows, MD;  Location: [facility] OR;  Service: Plastics;  Laterality: N/A;   Family History  Problem Relation Age of Onset  . Diabetes Maternal Grandfather   . Cancer Paternal Grandmother   . Diabetes Paternal Grandfather    History  Substance Use Topics  . Smoking status: Former Smoker    Types: Cigarettes    Quit date: 11/02/2010  . Smokeless tobacco: Never Used  . Alcohol Use: No   OB History    Gravida Para Term Preterm AB TAB SAB  Ectopic Multiple Living   2 1 1  1  1   1      Review of Systems  Constitutional: Negative for fever, chills and diaphoresis.  HENT: Negative for tinnitus.   Respiratory: Negative for cough, chest tightness and shortness of breath.   Cardiovascular: Positive for chest pain. Negative for palpitations and leg swelling.  Gastrointestinal: Negative for nausea, vomiting, abdominal pain and diarrhea.  Musculoskeletal: Positive for back pain and arthralgias. Negative for neck pain and neck stiffness.  Skin: Negative for rash and wound.  Neurological: Positive for dizziness and light-headedness. Negative for syncope, weakness and headaches.  All other systems reviewed and are negative.     Allergies  Review of patient's allergies indicates no known allergies.  Home Medications   Prior to Admission medications   Medication Sig Start Date End Date Taking? Authorizing Provider  clindamycin (CLEOCIN) 300 MG capsule Take 1 capsule (300 mg total) by mouth 3 (three) times daily. Patient not taking: Reported on 01/30/2015 09/01/14   Glenna Fellows, MD  docusate sodium (COLACE) 100 MG capsule Take 1 capsule (100 mg total) by mouth 2 (two) times daily. Patient not taking: Reported on 08/31/2014 05/23/14   Nani Ravens, MD  ibuprofen (ADVIL,MOTRIN) 600 MG tablet Take 1 tablet (600 mg total) by mouth 3 (three) times daily after meals. 01/30/15   Loren Racer, MD  oxyCODONE-acetaminophen (PERCOCET) [phone]  MG per tablet Take 2 tablets by mouth every 4 (four) hours as needed. Patient not taking: Reported on 01/30/2015 09/01/14   Glenna Fellows, MD   Triage Vitals: BP 123/71 mmHg  Pulse 97  Temp(Src) 98.3 F (36.8 C) (Oral)  Resp 24  SpO2 100%   Physical Exam  Constitutional: She is oriented to person, place, and time. She appears well-developed and well-nourished. No distress.  HENT:  Head: Normocephalic and atraumatic.  Mouth/Throat: Oropharynx is clear and moist.  Eyes: EOM are normal.  Pupils are equal, round, and reactive to light.  Neck: Normal range of motion. Neck supple.  Cardiovascular: Normal rate and regular rhythm.  Exam reveals no gallop and no friction rub.   Pulmonary/Chest: Effort normal and breath sounds normal. No respiratory distress. She has no wheezes. She has no rales.  Abdominal: Soft. Bowel sounds are normal. She exhibits no distension and no mass. There is no tenderness. There is no rebound and no guarding.  Musculoskeletal: Normal range of motion. She exhibits no edema or tenderness.  No lower extremity swelling or tenderness.  Neurological: She is alert and oriented to person, place, and time.  Skin: Skin is warm and dry. No rash noted. No erythema.  Psychiatric: She has a normal mood and affect. Her behavior is normal.  Nursing note and vitals reviewed.   ED Course  Procedures (including critical care time)  DIAGNOSTIC STUDIES: Oxygen Saturation is 100% on RA, Normal by my interpretation.    COORDINATION OF CARE: 3:31 AM- Will order CXR, hCG, D-dimer, i-stat troponin, BMP, and EKG. Discussed treatment plan with pt at bedside and pt agreed to plan.     Labs Review Labs Reviewed  CBC - Abnormal; Notable for the following:    WBC 12.0 (*)    MCH 25.3 (*)    All other components within normal limits  BASIC METABOLIC PANEL  HCG, SERUM, QUALITATIVE  D-DIMER, QUANTITATIVE (NOT AT [facility])  Rosezena Sensor, ED    Imaging Review Dg Chest 2 View  01/30/2015   CLINICAL DATA:  Acute onset of recurrent chest pain, shortness of breath and dizzy spells. Initial encounter.  EXAM: CHEST  2 VIEW  COMPARISON:  Chest radiograph from 05/07/2014  FINDINGS: The lungs are well-aerated. Pulmonary vascularity is at the upper limits of normal. There is no evidence of focal opacification, pleural effusion or pneumothorax.  The heart is normal in size; the mediastinal contour is within normal limits. No acute osseous abnormalities are seen.  IMPRESSION: No acute  cardiopulmonary process seen.   Electronically Signed   By: Roanna Raider M.D.   On: 01/30/2015 03:39     EKG Interpretation   Date/Time:  Tuesday January 30 2015 03:16:30 EDT Ventricular Rate:  104 PR Interval:  137 QRS Duration: 83 QT Interval:  324 QTC Calculation: 426 R Axis:   22 Text Interpretation:  Sinus tachycardia Confirmed by Ranae Palms  MD, Loc Feinstein  ([id number]) on 01/30/2015 5:50:11 AM      MDM   Final diagnoses:  Pleuritic chest pain    I personally performed the services described in this documentation, which was scribed in my presence. The recorded information has been reviewed and is accurate.   Chest x-ray, d-dimer, EKG and troponin all within normal limits. Patient's chest pain is pleuritic in nature. Suspect pleurisy. We'll treat with anti-inflammatory medication and patient has been instructed to follow-up with her primary physician. She's been given strict return precautions and has voiced understanding.   Loren Racer, MD 01/30/15  0552 

## 2016-01-13 IMAGING — US US OB FOLLOW-UP
1 series · 12 of 28 positions shown · non-contrast
Comparison: none

[Series 1: us ob follow-up · 0.23mm/px · 12 of 67 slices shown]
[im 3/67]
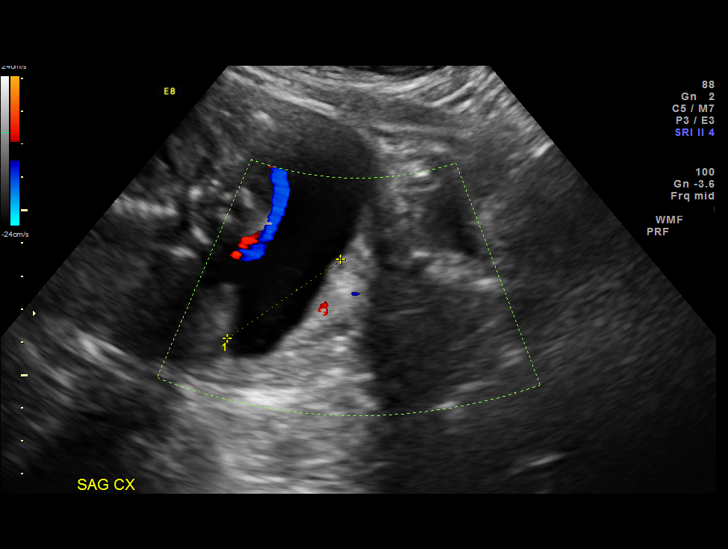
[im 8/67]
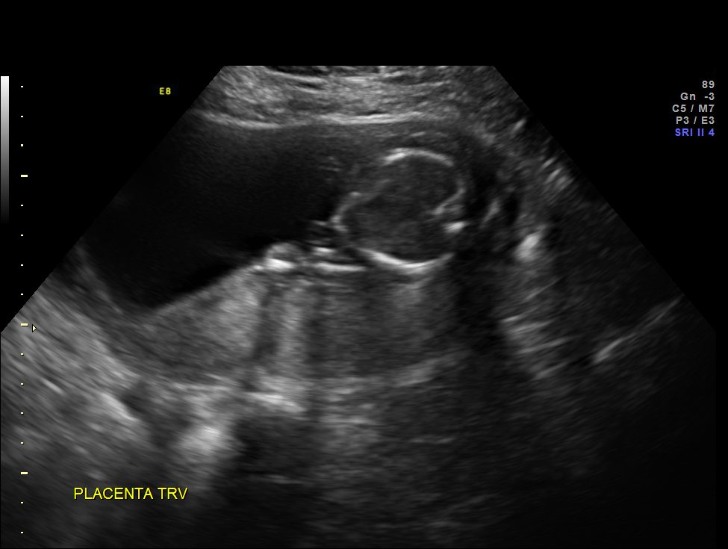
[im 13/67]
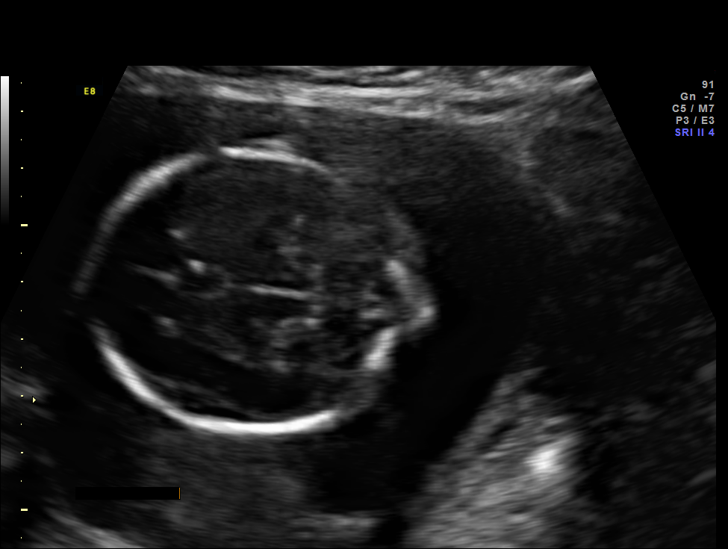
[im 20/67]
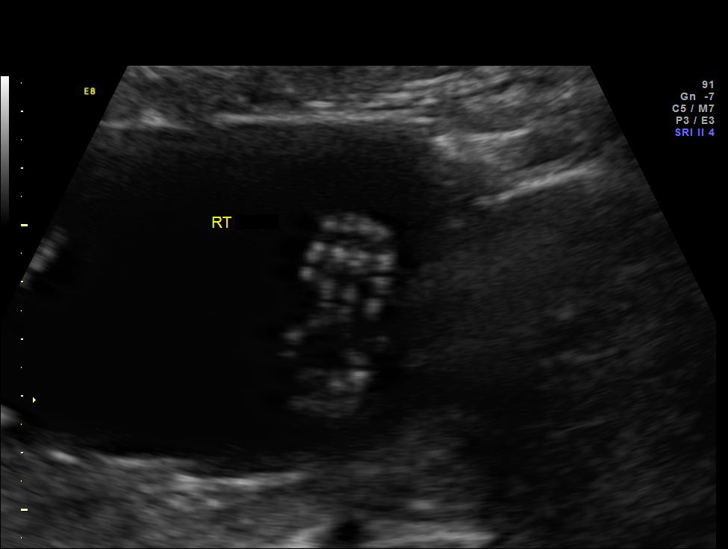
[im 25/67]
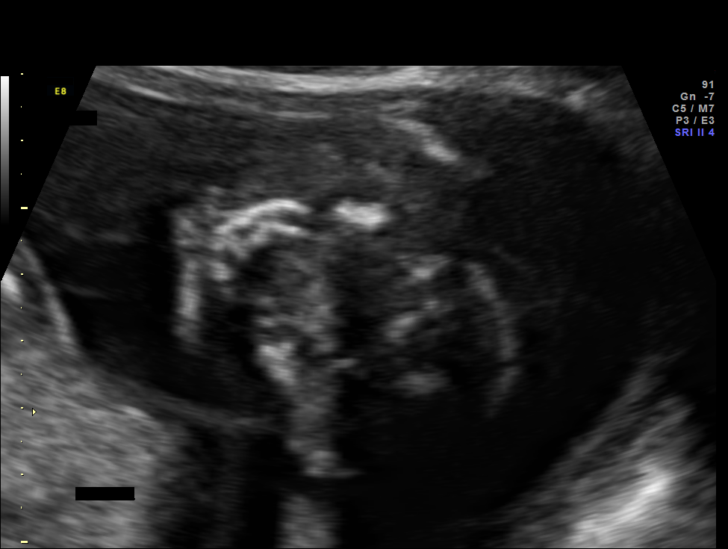
[im 30/67]
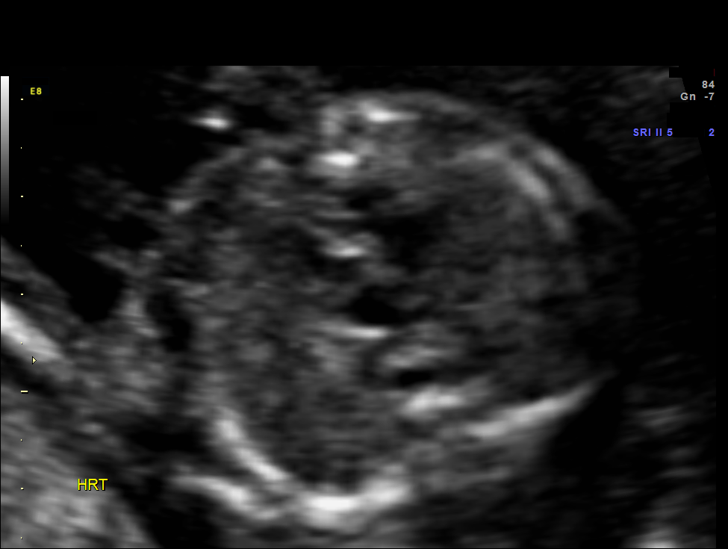
[im 37/67]
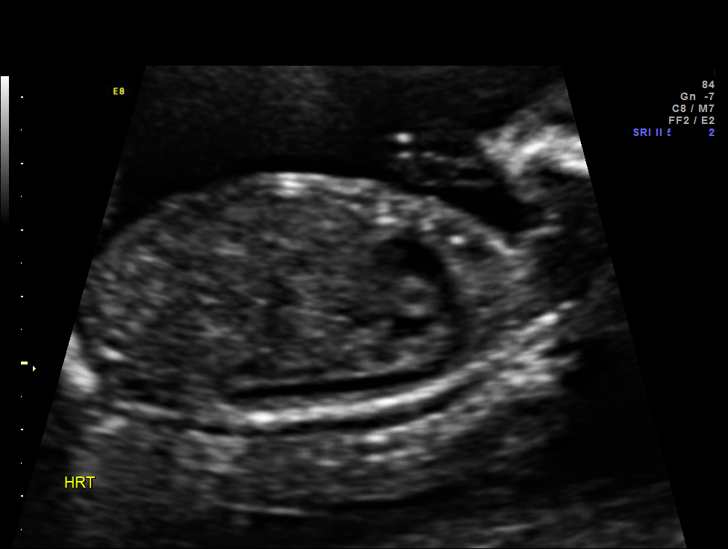
[im 42/67]
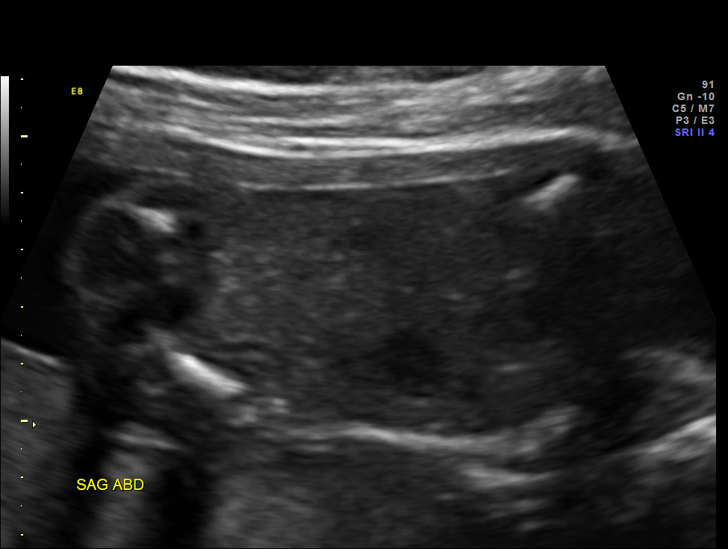
[im 47/67]
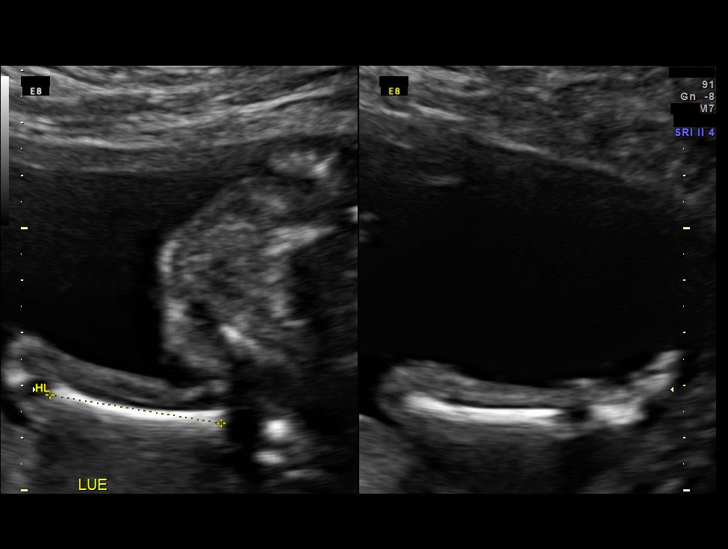
[im 54/67]
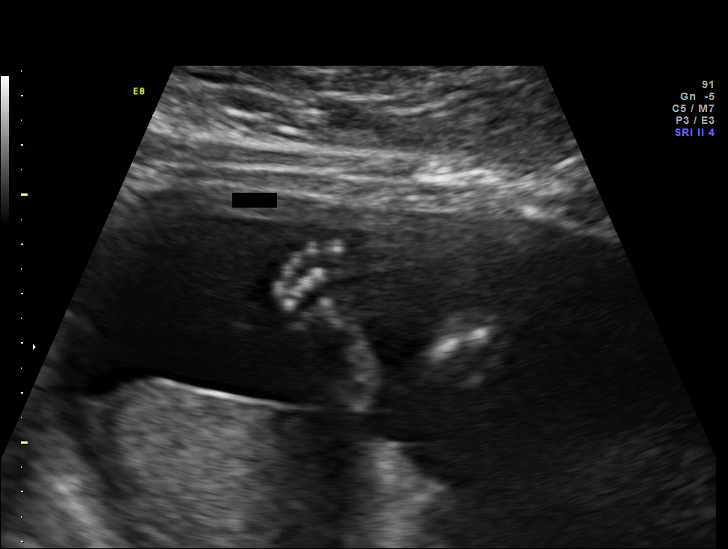
[im 59/67]
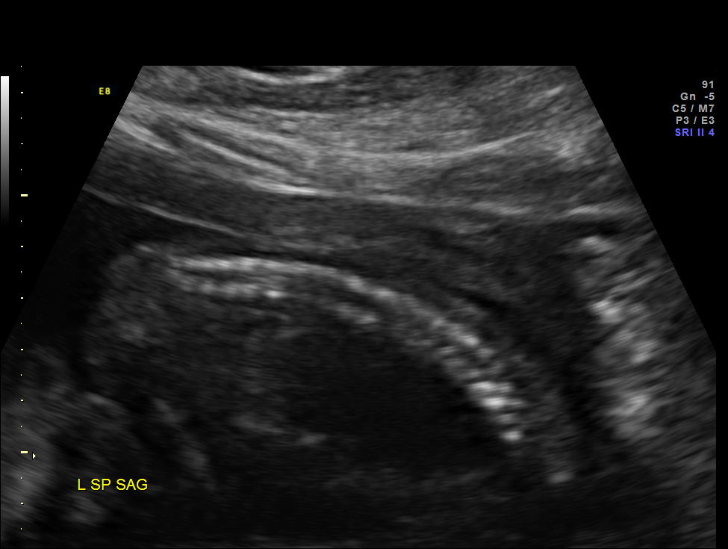
[im 64/67]
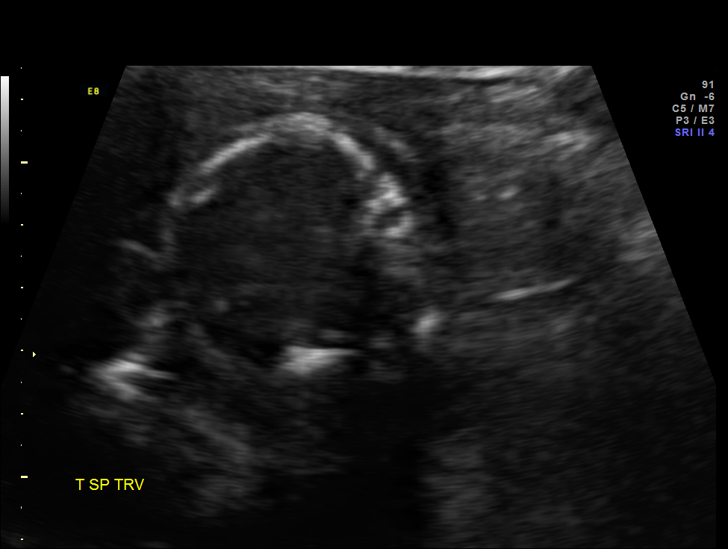

[12 of 28 positions shown; findings below may reference images not displayed]

OBSTETRICS REPORT
                      (Signed Final 01/11/2014 [DATE])

Service(s) Provided

 US OB FOLLOW UP                                       76816.1
Indications

 Follow-up incomplete fetal anatomic evaluation
 Elevated hCG (3.11 MoM) and DIA (4.78 MoM)
 Abnormal biochemical screen (quad) for Trisomy
 21 ([DATE]) - low risk NIPS
 Maternal obesity (229 lb)
Fetal Evaluation

 Num Of Fetuses:    1
 Fetal Heart Rate:  152                          bpm
 Cardiac Activity:  Observed
 Presentation:      Cephalic
 Placenta:          Posterior, above cervical
                    os
 P. Cord            Previously Visualized
 Insertion:

 Amniotic Fluid
 AFI FV:      Subjectively within normal limits
                                             Larg Pckt:     5.3  cm
Biometry

 BPD:     48.5  mm     G. Age:  20w 5d                CI:         79.6   70 - 86
 OFD:     60.9  mm                                    FL/HC:      18.4   15.9 -

 HC:     176.5  mm     G. Age:  20w 1d       15  %    HC/AC:      1.10   1.06 -

 AC:     160.9  mm     G. Age:  21w 1d       54  %    FL/BPD:
 FL:      32.5  mm     G. Age:  20w 1d       19  %    FL/AC:      20.2   20 - 24
 HUM:     32.7  mm     G. Age:  21w 0d       51  %
 CER:     21.5  mm     G. Age:  20w 4d       42  %

 Est. FW:     368  gm    0 lb 13 oz      40  %
Gestational Age

 LMP:           20w 6d        Date:  08/18/13                 EDD:   05/25/14
 U/S Today:     20w 4d                                        EDD:   05/27/14
 Best:          20w 6d     Det. By:  LMP  (08/18/13)          EDD:   05/25/14
Anatomy

 Cranium:          Appears normal         Aortic Arch:      Appears normal
 Fetal Cavum:      Appears normal         Ductal Arch:      Appears normal
 Ventricles:       Appears normal         Diaphragm:        Not well visualized
 Choroid Plexus:   Appears normal         Stomach:          Appears normal, left
                                                            sided
 Cerebellum:       Appears normal         Abdomen:          Appears normal
 Posterior Fossa:  Appears normal         Abdominal Wall:   Appears nml (cord
                                                            insert, abd wall)
 Nuchal Fold:      Not applicable (>20    Cord Vessels:     Appears normal (3
                   wks GA)                                  vessel cord)
 Face:             Appears normal         Kidneys:          Appear normal
                   (orbits and profile)
 Lips:             Appears normal         Bladder:          Appears normal
 Palate:           Appears normal         Spine:            Appears normal
 Heart:            Appears normal         Lower             Appears normal
                   (4CH, axis, and        Extremities:
                   situs)
 RVOT:             Appears normal         Upper             Appears normal
                                          Extremities:
 LVOT:             Appears normal

 Other:  Fetus appears to be a female. Heels and 5th digit visualized. Nasal
         bone visualized. Technically difficult due to  maternal habitus.
Targeted Anatomy

 Fetal Central Nervous System
 Cisterna Magna:
Cervix Uterus Adnexa

 Cervical Length:    3.7      cm

 Cervix:       Normal appearance by transabdominal scan.

 Left Ovary:    Within normal limits.
 Right Ovary:   Within normal limits.
 Adnexa:     No abnormality visualized.
Impression

 Single IUP at 20w 6d
 Follow up due to [DATE] DSR by quad screen - NIPS low risk for
 aneuploidy
 Elevated HCG (3.1 MoM) and DIA (4.78 MoM)
 Normal interval anatomy
 Fetal growth is appropriate (40th %tile)
 Normal amniotic fluid volume
Recommendations

 Recommend follow-up ultrasound examination in 8-10 weeks
 for growth due to elevated serum analytes on quad screen

## 2016-01-21 DIAGNOSIS — Y92410 Unspecified street and highway as the place of occurrence of the external cause: Secondary | ICD-10-CM | POA: Insufficient documentation

## 2016-01-21 DIAGNOSIS — R1031 Right lower quadrant pain: Secondary | ICD-10-CM | POA: Diagnosis not present

## 2016-01-21 DIAGNOSIS — Z792 Long term (current) use of antibiotics: Secondary | ICD-10-CM | POA: Diagnosis not present

## 2016-01-21 DIAGNOSIS — Z79899 Other long term (current) drug therapy: Secondary | ICD-10-CM | POA: Diagnosis not present

## 2016-01-21 DIAGNOSIS — Z87891 Personal history of nicotine dependence: Secondary | ICD-10-CM | POA: Insufficient documentation

## 2016-01-21 DIAGNOSIS — R1011 Right upper quadrant pain: Secondary | ICD-10-CM | POA: Diagnosis not present

## 2016-01-21 DIAGNOSIS — Z8679 Personal history of other diseases of the circulatory system: Secondary | ICD-10-CM | POA: Diagnosis not present

## 2016-01-21 DIAGNOSIS — Y9389 Activity, other specified: Secondary | ICD-10-CM | POA: Insufficient documentation

## 2016-01-21 DIAGNOSIS — Y999 Unspecified external cause status: Secondary | ICD-10-CM | POA: Insufficient documentation

## 2016-01-22 ENCOUNTER — Encounter (HOSPITAL_COMMUNITY): Payer: Self-pay | Admitting: *Deleted

## 2016-01-22 ENCOUNTER — Emergency Department (HOSPITAL_COMMUNITY)
Admission: EM | Admit: 2016-01-22 | Discharge: 2016-01-22 | Disposition: A | Discharge status: Home / Self Care | Payer: No Typology Code available for payment source | Attending: Emergency Medicine | Admitting: Emergency Medicine

## 2016-01-22 ENCOUNTER — Emergency Department (HOSPITAL_COMMUNITY): Payer: No Typology Code available for payment source

## 2016-01-22 DIAGNOSIS — R109 Unspecified abdominal pain: Secondary | ICD-10-CM

## 2016-01-22 LAB — URINE MICROSCOPIC-ADD ON
Bacteria, UA: NONE SEEN
RBC / HPF: NONE SEEN RBC/hpf (ref 0–5)

## 2016-01-22 LAB — URINALYSIS, ROUTINE W REFLEX MICROSCOPIC
GLUCOSE, UA: NEGATIVE mg/dL
Hgb urine dipstick: NEGATIVE
Ketones, ur: NEGATIVE mg/dL
Leukocytes, UA: NEGATIVE
Nitrite: NEGATIVE
Protein, ur: 30 mg/dL — AB
SPECIFIC GRAVITY, URINE: 1.031 — AB (ref 1.005–1.030)
pH: 5.5 (ref 5.0–8.0)

## 2016-01-22 LAB — POC URINE PREG, ED: Preg Test, Ur: NEGATIVE

## 2016-01-22 MED ORDER — CYCLOBENZAPRINE HCL 10 MG PO TABS: 10.0000 mg | ORAL_TABLET | Freq: Two times a day (BID) | ORAL | Status: DC | PRN

## 2016-01-22 MED ORDER — IBUPROFEN 800 MG PO TABS: 800.0000 mg | ORAL_TABLET | Freq: Three times a day (TID) | ORAL | Status: DC

## 2016-01-22 MED ORDER — IOPAMIDOL (ISOVUE-300) INJECTION 61%
100.0000 mL | Freq: Once | INTRAVENOUS | Status: AC | PRN
  Administered 2016-01-22: 100 mL via INTRAVENOUS
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
## 2016-01-22 NOTE — Discharge Instructions (Signed)
Cryotherapy °Cryotherapy means treatment with cold. Ice or gel packs can be used to reduce both pain and swelling. Ice is the most helpful within the first 24 to 48 hours after an injury or flare-up from overusing a muscle or joint. Sprains, strains, spasms, burning pain, shooting pain, and aches can all be eased with ice. Ice can also be used when recovering from surgery. Ice is effective, has very few side effects, and is safe for most people to use. °PRECAUTIONS  °Ice is not a safe treatment option for people with: °· Raynaud phenomenon. This is a condition affecting small blood vessels in the extremities. Exposure to cold may cause your problems to return. °· Cold hypersensitivity. There are many forms of cold hypersensitivity, including: °¨ Cold urticaria. Red, itchy hives appear on the skin when the tissues begin to warm after being iced. °¨ Cold erythema. This is a red, itchy rash caused by exposure to cold. °¨ Cold hemoglobinuria. Red blood cells break down when the tissues begin to warm after being iced. The hemoglobin that carry oxygen are passed into the urine because they cannot combine with blood proteins fast enough. °· Numbness or altered sensitivity in the area being iced. °If you have any of the following conditions, do not use ice until you have discussed cryotherapy with your caregiver: °· Heart conditions, such as arrhythmia, angina, or chronic heart disease. °· High blood pressure. °· Healing wounds or open skin in the area being iced. °· Current infections. °· Rheumatoid arthritis. °· Poor circulation. °· Diabetes. °Ice slows the blood flow in the region it is applied. This is beneficial when trying to stop inflamed tissues from spreading irritating chemicals to surrounding tissues. However, if you expose your skin to cold temperatures for too long or without the proper protection, you can damage your skin or nerves. Watch for signs of skin damage due to cold. °HOME CARE INSTRUCTIONS °Follow  these tips to use ice and cold packs safely. °· Place a dry or damp towel between the ice and skin. A damp towel will cool the skin more quickly, so you may need to shorten the time that the ice is used. °· For a more rapid response, add gentle compression to the ice. °· Ice for no more than 10 to 20 minutes at a time. The bonier the area you are icing, the less time it will take to get the benefits of ice. °· Check your skin after 5 minutes to make sure there are no signs of a poor response to cold or skin damage. °· Rest 20 minutes or more between uses. °· Once your skin is numb, you can end your treatment. You can test numbness by very lightly touching your skin. The touch should be so light that you do not see the skin dimple from the pressure of your fingertip. When using ice, most people will feel these normal sensations in this order: cold, burning, aching, and numbness. °· Do not use ice on someone who cannot communicate their responses to pain, such as small children or people with dementia. °HOW TO MAKE AN ICE PACK °Ice packs are the most common way to use ice therapy. Other methods include ice massage, ice baths, and cryosprays. Muscle creams that cause a cold, tingly feeling do not offer the same benefits that ice offers and should not be used as a substitute unless recommended by your caregiver. °To make an ice pack, do one of the following: °· Place crushed ice or a   bag of frozen vegetables in a sealable plastic bag. Squeeze out the excess air. Place this bag inside another plastic bag. Slide the bag into a pillowcase or place a damp towel between your skin and the bag. °· Mix 3 parts water with 1 part rubbing alcohol. Freeze the mixture in a sealable plastic bag. When you remove the mixture from the freezer, it will be slushy. Squeeze out the excess air. Place this bag inside another plastic bag. Slide the bag into a pillowcase or place a damp towel between your skin and the bag. °SEEK MEDICAL CARE  IF: °· You develop white spots on your skin. This may give the skin a blotchy (mottled) appearance. °· Your skin turns blue or pale. °· Your skin becomes waxy or hard. °· Your swelling gets worse. °MAKE SURE YOU:  °· Understand these instructions. °· Will watch your condition. °· Will get help right away if you are not doing well or get worse. °  °This information is not intended to replace advice given to you by your health care provider. Make sure you discuss any questions you have with your health care provider. °  °Document Released: 03/24/2011 Document Revised: 08/18/2014 Document Reviewed: 03/24/2011 °Elsevier Interactive Patient Education ©2016 [employer] °Motor Vehicle Collision °It is common to have multiple bruises and sore muscles after a motor vehicle collision (MVC). These tend to feel worse for the first 24 hours. You may have the most stiffness and soreness over the first several hours. You may also feel worse when you wake up the first morning after your collision. After this point, you will usually begin to improve with each day. The speed of improvement often depends on the severity of the collision, the number of injuries, and the location and nature of these injuries. °HOME CARE INSTRUCTIONS °· Put ice on the injured area. °¨ Put ice in a plastic bag. °¨ Place a towel between your skin and the bag. °¨ Leave the ice on for 15-20 minutes, 3-4 times a day, or as directed by your health care provider. °· Drink enough fluids to keep your urine clear or pale yellow. Do not drink alcohol. °· Take a warm shower or bath once or twice a day. This will increase blood flow to sore muscles. °· You may return to activities as directed by your caregiver. Be careful when lifting, as this may aggravate neck or back pain. °· Only take over-the-counter or prescription medicines for pain, discomfort, or fever as directed by your caregiver. Do not use aspirin. This may increase bruising and bleeding. °SEEK  IMMEDIATE MEDICAL CARE IF: °· You have numbness, tingling, or weakness in the arms or legs. °· You develop severe headaches not relieved with medicine. °· You have severe neck pain, especially tenderness in the middle of the back of your neck. °· You have changes in bowel or bladder control. °· There is increasing pain in any area of the body. °· You have shortness of breath, light-headedness, dizziness, or fainting. °· You have chest pain. °· You feel sick to your stomach (nauseous), throw up (vomit), or sweat. °· You have increasing abdominal discomfort. °· There is blood in your urine, stool, or vomit. °· You have pain in your shoulder (shoulder strap areas). °· You feel your symptoms are getting worse. °MAKE SURE YOU: °· Understand these instructions. °· Will watch your condition. °· Will get help right away if you are not doing well or get worse. °  °This information is not   intended to replace advice given to you by your health care provider. Make sure you discuss any questions you have with your health care provider. °  °Document Released: 07/28/2005 Document Revised: 08/18/2014 Document Reviewed: 12/25/2010 °Elsevier Interactive Patient Education ©2016 [employer] ° °

## 2016-01-22 NOTE — ED Notes (Signed)
Pt reports understanding of discharge information. No questions at time of discharge 

## 2016-03-31 ENCOUNTER — Ambulatory Visit (HOSPITAL_COMMUNITY)
Admission: EM | Admit: 2016-03-31 | Discharge: 2016-03-31 | Disposition: A | Discharge status: Home / Self Care | Payer: Medicaid Other | Attending: Family Medicine | Admitting: Family Medicine

## 2016-03-31 ENCOUNTER — Encounter (HOSPITAL_COMMUNITY): Payer: Self-pay | Admitting: Emergency Medicine

## 2016-03-31 DIAGNOSIS — H109 Unspecified conjunctivitis: Secondary | ICD-10-CM

## 2016-03-31 MED ORDER — TOBRAMYCIN 0.3 % OP SOLN: 1.0000 [drp] | Freq: Four times a day (QID) | OPHTHALMIC | 0 refills | Status: DC

## 2016-03-31 NOTE — ED Provider Notes (Signed)
[facility]    CSN: [id number] Arrival date & time: 03/31/16  1507  First Provider Contact:  First MD Initiated Contact with Patient 03/31/16 1546        History   Chief Complaint Chief Complaint  Patient presents with  . Eye Problem    HPI Jodi Terrell is a 20 y.o. female.   Eye Problem  Location:  Right eye Quality:  Dull Severity:  Mild Onset quality:  Gradual Duration:  3 days Progression:  Unchanged Chronicity:  New Context: not contact lens problem   Relieved by:  None tried Worsened by:  Nothing Ineffective treatments:  None tried Associated symptoms: crusting, discharge, itching, redness and swelling   Associated symptoms: no decreased vision, no double vision and no photophobia     Past Medical History:  Diagnosis Date  . Arrhythmia    with pneumonia  . Heart murmur    as a child  . Pneumonia    09/01/14- 3- 4 years ago  . Seasonal allergies     Patient Active Problem List   Diagnosis Date Noted  . Premature rupture of membranes 05/21/2014  . Hemorrhage affecting pregnancy in second trimester 01/25/2014  . Abnormal antenatal AFP screen 12/07/2013  . Obesity complicating pregnancy 11/30/2013  . Supervision of normal first teen pregnancy 11/01/2013    Past Surgical History:  Procedure Laterality Date  . CLOSED REDUCTION NASAL FRACTURE N/A 09/01/2014   Procedure: CLOSED REDUCTION NASAL FRACTURE;  Surgeon: Glenna FellowsBrinda Thimmappa, MD;  Location: [facility] OR;  Service: Plastics;  Laterality: N/A;  . TYMPANOSTOMY TUBE PLACEMENT      OB History    Gravida Para Term Preterm AB Living   2 1 1   1 1    SAB TAB Ectopic Multiple Live Births   1       1       Home Medications    Prior to Admission medications   Medication Sig Start Date End Date Taking? Authorizing Provider  clindamycin (CLEOCIN) 300 MG capsule Take 1 capsule (300 mg total) by mouth 3 (three) times daily. Patient not taking: Reported on 01/30/2015 09/01/14   Glenna FellowsBrinda Thimmappa, MD   cyclobenzaprine (FLEXERIL) 10 MG tablet Take 1 tablet (10 mg total) by mouth 2 (two) times daily as needed for muscle spasms. 01/22/16   Elpidio AnisShari Upstill, PA-C  docusate sodium (COLACE) 100 MG capsule Take 1 capsule (100 mg total) by mouth 2 (two) times daily. Patient not taking: Reported on 08/31/2014 05/23/14   Nani RavensAndrew M Wight, MD  ibuprofen (ADVIL,MOTRIN) 800 MG tablet Take 1 tablet (800 mg total) by mouth 3 (three) times daily. 01/22/16   Elpidio AnisShari Upstill, PA-C  oxyCODONE-acetaminophen (PERCOCET) 5-325 MG per tablet Take 2 tablets by mouth every 4 (four) hours as needed. Patient not taking: Reported on 01/30/2015 09/01/14   Glenna FellowsBrinda Thimmappa, MD  tobramycin (TOBREX) 0.3 % ophthalmic solution Place 1 drop into the right eye every 6 (six) hours. 03/31/16   Linna HoffJames D Zanyah Lentsch, MD    Family History Family History  Problem Relation Age of Onset  . Diabetes Maternal Grandfather   . Cancer Paternal Grandmother   . Diabetes Paternal Grandfather     Social History Social History  Substance Use Topics  . Smoking status: Former Smoker    Types: Cigarettes    Quit date: 11/02/2010  . Smokeless tobacco: Never Used  . Alcohol use No     Allergies   Review of patient's allergies indicates no known allergies.   Review  of Systems Review of Systems  HENT: Negative.   Eyes: Positive for pain, discharge, redness and itching. Negative for double vision, photophobia and visual disturbance.  Respiratory: Negative.   All other systems reviewed and are negative.    Physical Exam Triage Vital Signs ED Triage Vitals  Enc Vitals Group     BP 03/31/16 1522 104/59     Pulse Rate 03/31/16 1522 88     Resp 03/31/16 1522 20     Temp 03/31/16 1522 98.5 F (36.9 C)     Temp Source 03/31/16 1522 Oral     SpO2 03/31/16 1522 100 %     Weight --      Height --      Head Circumference --      Peak Flow --      Pain Score 03/31/16 1540 4     Pain Loc --      Pain Edu? --      Excl. in GC? --    No data  found.   Updated Vital Signs BP 104/59 (BP Location: Left Arm)   Pulse 88   Temp 98.5 F (36.9 C) (Oral)   Resp 20   SpO2 100%   Visual Acuity Right Eye Distance: 20/50 Left Eye Distance: 20/50 Bilateral Distance: 20/50  Right Eye Near:   Left Eye Near:    Bilateral Near:     Physical Exam  Constitutional: She appears well-developed and well-nourished. No distress.  Eyes: EOM and lids are normal. Pupils are equal, round, and reactive to light. Right eye exhibits discharge. Left eye exhibits no discharge. Right conjunctiva is injected. Right conjunctiva has no hemorrhage. Left conjunctiva is not injected. Left conjunctiva has no hemorrhage.  Slit lamp exam:      The right eye shows no foreign body.       The left eye shows no foreign body.  Nursing note and vitals reviewed.    UC Treatments / Results  Labs (all labs ordered are listed, but only abnormal results are displayed) Labs Reviewed - No data to display  EKG  EKG Interpretation None       Radiology No results found.  Procedures Procedures (including critical care time)  Medications Ordered in UC Medications - No data to display   Initial Impression / Assessment and Plan / UC Course  I have reviewed the triage vital signs and the nursing notes.  Pertinent labs & imaging results that were available during my care of the patient were reviewed by me and considered in my medical decision making (see chart for details).  Clinical Course      Final Clinical Impressions(s) / UC Diagnoses   Final diagnoses:  Conjunctivitis of right eye    New Prescriptions New Prescriptions   TOBRAMYCIN (TOBREX) 0.3 % OPHTHALMIC SOLUTION    Place 1 drop into the right eye every 6 (six) hours.     Linna HoffJames D Breeana Sawtelle, MD 03/31/16 [phone]

## 2016-03-31 NOTE — ED Triage Notes (Signed)
[facility] with a complaint of right eye irritation and swelling that started about 3 days ago.

## 2017-06-16 DIAGNOSIS — Z0000 Encounter for general adult medical examination without abnormal findings: Secondary | ICD-10-CM | POA: Diagnosis not present

## 2017-06-16 DIAGNOSIS — Z30018 Encounter for initial prescription of other contraceptives: Secondary | ICD-10-CM | POA: Diagnosis not present

## 2017-06-16 DIAGNOSIS — Z3009 Encounter for other general counseling and advice on contraception: Secondary | ICD-10-CM | POA: Diagnosis not present

## 2017-06-16 DIAGNOSIS — Z113 Encounter for screening for infections with a predominantly sexual mode of transmission: Secondary | ICD-10-CM | POA: Diagnosis not present

## 2017-06-16 DIAGNOSIS — Z3046 Encounter for surveillance of implantable subdermal contraceptive: Secondary | ICD-10-CM | POA: Diagnosis not present

## 2017-12-12 ENCOUNTER — Encounter (HOSPITAL_COMMUNITY): Payer: Self-pay | Admitting: Emergency Medicine

## 2017-12-12 ENCOUNTER — Emergency Department (HOSPITAL_COMMUNITY)
Admission: EM | Admit: 2017-12-12 | Discharge: 2017-12-12 | Disposition: A | Discharge status: Home / Self Care | Payer: Medicaid Other | Attending: Emergency Medicine | Admitting: Emergency Medicine

## 2017-12-12 ENCOUNTER — Other Ambulatory Visit: Payer: Self-pay

## 2017-12-12 DIAGNOSIS — Z79899 Other long term (current) drug therapy: Secondary | ICD-10-CM | POA: Diagnosis not present

## 2017-12-12 DIAGNOSIS — O209 Hemorrhage in early pregnancy, unspecified: Secondary | ICD-10-CM | POA: Diagnosis not present

## 2017-12-12 DIAGNOSIS — Z87891 Personal history of nicotine dependence: Secondary | ICD-10-CM | POA: Insufficient documentation

## 2017-12-12 DIAGNOSIS — Z3A00 Weeks of gestation of pregnancy not specified: Secondary | ICD-10-CM | POA: Insufficient documentation

## 2017-12-12 DIAGNOSIS — O4690 Antepartum hemorrhage, unspecified, unspecified trimester: Secondary | ICD-10-CM

## 2017-12-12 DIAGNOSIS — O3680X0 Pregnancy with inconclusive fetal viability, not applicable or unspecified: Secondary | ICD-10-CM

## 2017-12-12 LAB — COMPREHENSIVE METABOLIC PANEL
ALT: 10 U/L — ABNORMAL LOW (ref 14–54)
AST: 15 U/L (ref 15–41)
Albumin: 3.4 g/dL — ABNORMAL LOW (ref 3.5–5.0)
Alkaline Phosphatase: 85 U/L (ref 38–126)
Anion gap: 6 (ref 5–15)
BUN: 7 mg/dL (ref 6–20)
CHLORIDE: 105 mmol/L (ref 101–111)
CO2: 26 mmol/L (ref 22–32)
Calcium: 8.8 mg/dL — ABNORMAL LOW (ref 8.9–10.3)
Creatinine, Ser: 0.62 mg/dL (ref 0.44–1.00)
GFR calc Af Amer: >60 mL/min (ref >60)
Glucose, Bld: 91 mg/dL (ref 65–99)
POTASSIUM: 4.6 mmol/L (ref 3.5–5.1)
Sodium: 137 mmol/L (ref 135–145)
Total Bilirubin: 0.6 mg/dL (ref 0.3–1.2)
Total Protein: 6.8 g/dL (ref 6.5–8.1)

## 2017-12-12 LAB — URINALYSIS, ROUTINE W REFLEX MICROSCOPIC
Bilirubin Urine: NEGATIVE
GLUCOSE, UA: NEGATIVE mg/dL
Ketones, ur: NEGATIVE mg/dL
Leukocytes, UA: NEGATIVE
NITRITE: NEGATIVE
PROTEIN: NEGATIVE mg/dL
Specific Gravity, Urine: 1.027 (ref 1.005–1.030)
pH: 5 (ref 5.0–8.0)

## 2017-12-12 LAB — WET PREP, GENITAL
Sperm: NONE SEEN
TRICH WET PREP: NONE SEEN
Yeast Wet Prep HPF POC: NONE SEEN

## 2017-12-12 LAB — CBC WITH DIFFERENTIAL/PLATELET
Basophils Absolute: 0 10*3/uL (ref 0.0–0.1)
Basophils Relative: 0 %
EOS PCT: 1 %
Eosinophils Absolute: 0.1 10*3/uL (ref 0.0–0.7)
HEMATOCRIT: 38.8 % (ref 36.0–46.0)
HEMOGLOBIN: 12.8 g/dL (ref 12.0–15.0)
LYMPHS ABS: 3.3 10*3/uL (ref 0.7–4.0)
LYMPHS PCT: 33 %
MCH: 28.4 pg (ref 26.0–34.0)
MCHC: 33 g/dL (ref 30.0–36.0)
MCV: 86.2 fL (ref 78.0–100.0)
Monocytes Absolute: 0.6 10*3/uL (ref 0.1–1.0)
Monocytes Relative: 6 %
NEUTROS ABS: 6.1 10*3/uL (ref 1.7–7.7)
Neutrophils Relative %: 60 %
PLATELETS: 273 10*3/uL (ref 150–400)
RBC: 4.5 MIL/uL (ref 3.87–5.11)
RDW: 13.2 % (ref 11.5–15.5)
WBC: 10.2 10*3/uL (ref 4.0–10.5)

## 2017-12-12 LAB — POC URINE PREG, ED: PREG TEST UR: POSITIVE — AB

## 2017-12-12 LAB — HCG, QUANTITATIVE, PREGNANCY: HCG, BETA CHAIN, QUANT, S: 58 m[IU]/mL — AB (ref <5)

## 2017-12-12 NOTE — ED Notes (Signed)
Got patient into a gown patient is resting with call bell in reach 

## 2017-12-12 NOTE — ED Provider Notes (Signed)
[facility] EMERGENCY DEPARTMENT Provider Note   CSN: [id number] Arrival date & time: 12/12/17  1021     History   Chief Complaint Chief Complaint  Patient presents with  . Vaginal Bleeding    HPI Jodi Terrell is a 22 y.o. female. G3 P1 A1 who presents to the ED with concern for vaginal bleeding for the past 1 month with positive pregnancy tests at home.  Patient states for the past 1 month she is having intermittent vaginal bleeding ranging from like a period to spotting.  She states that about 5 days/week she has some type of vaginal bleeding.  She said that one week ago she had 3 days of heavy bleeding like a period which required a pad.  States that since then she just has had some spotting including today.  Today that she did have a fairly significant amount of blood when she wiped one time, since it has just been minimal and salmon-colored spots with wiping.  She has had some pelvic cramping which she describes as "growing pains with pregnancy" and minimal.  She took a pregnancy test at home for the past 2 days all of which have been positive.  She has had some nausea without vomiting.  No alleviating or aggravating factors to her symptoms.  States that she has had miscarriage at 10 weeks, she also has 1 child at home which she experienced PROM with.  Denies fever, chills, vomiting, diarrhea, blood in stool, dysuria, or vaginal discharge.   HPI  Past Medical History:  Diagnosis Date  . Arrhythmia    with pneumonia  . Heart murmur    as a child  . Pneumonia    09/01/14- 3- 4 years ago  . Seasonal allergies     Patient Active Problem List   Diagnosis Date Noted  . Premature rupture of membranes 05/21/2014  . Hemorrhage affecting pregnancy in second trimester 01/25/2014  . Abnormal antenatal AFP screen 12/07/2013  . Obesity complicating pregnancy 11/30/2013  . Supervision of normal first teen pregnancy 11/01/2013    Past Surgical History:  Procedure  Laterality Date  . CLOSED REDUCTION NASAL FRACTURE N/A 09/01/2014   Procedure: CLOSED REDUCTION NASAL FRACTURE;  Surgeon: Glenna Fellows, MD;  Location: [facility] OR;  Service: Plastics;  Laterality: N/A;  . TYMPANOSTOMY TUBE PLACEMENT       OB History    Gravida  2   Para  1   Term  1   Preterm      AB  1   Living  1     SAB  1   TAB      Ectopic      Multiple      Live Births  1            Home Medications    Prior to Admission medications   Medication Sig Start Date End Date Taking? Authorizing Provider  clindamycin (CLEOCIN) 300 MG capsule Take 1 capsule (300 mg total) by mouth 3 (three) times daily. Patient not taking: Reported on 01/30/2015 09/01/14   Glenna Fellows, MD  cyclobenzaprine (FLEXERIL) 10 MG tablet Take 1 tablet (10 mg total) by mouth 2 (two) times daily as needed for muscle spasms. 01/22/16   Elpidio Anis, PA-C  docusate sodium (COLACE) 100 MG capsule Take 1 capsule (100 mg total) by mouth 2 (two) times daily. Patient not taking: Reported on 08/31/2014 05/23/14   Nani Ravens, MD  ibuprofen (ADVIL,MOTRIN) 800 MG tablet Take 1  tablet (800 mg total) by mouth 3 (three) times daily. 01/22/16   Elpidio Anis, PA-C  oxyCODONE-acetaminophen (PERCOCET) 5-325 MG per tablet Take 2 tablets by mouth every 4 (four) hours as needed. Patient not taking: Reported on 01/30/2015 09/01/14   Glenna Fellows, MD  tobramycin (TOBREX) 0.3 % ophthalmic solution Place 1 drop into the right eye every 6 (six) hours. 03/31/16   Linna Hoff, MD    Family History Family History  Problem Relation Age of Onset  . Diabetes Maternal Grandfather   . Cancer Paternal Grandmother   . Diabetes Paternal Grandfather     Social History Social History   Tobacco Use  . Smoking status: Former Smoker    Types: Cigarettes    Last attempt to quit: 11/02/2010    Years since quitting: 7.1  . Smokeless tobacco: Never Used  Substance Use Topics  . Alcohol use: No  . Drug use: No      Allergies   Patient has no known allergies.  Review of Systems Review of Systems  Constitutional: Negative for chills and fever.  Respiratory: Negative for shortness of breath.   Cardiovascular: Negative for chest pain.  Gastrointestinal: Positive for nausea. Negative for blood in stool, constipation, diarrhea and vomiting.  Genitourinary: Positive for pelvic pain and vaginal bleeding. Negative for dysuria and vaginal discharge.  Neurological: Negative for dizziness, syncope and light-headedness.  All other systems reviewed and are negative.   Physical Exam Updated Vital Signs BP 112/71   Pulse 86   Temp 98.9 F (37.2 C) (Oral)   Resp 16   LMP 12/05/2017   SpO2 98%   Physical Exam  Constitutional: She appears well-developed and well-nourished. No distress.  HENT:  Head: Normocephalic and atraumatic.  Eyes: Conjunctivae are normal. Right eye exhibits no discharge. Left eye exhibits no discharge.  Cardiovascular: Normal rate and regular rhythm.  No murmur heard. Pulmonary/Chest: Breath sounds normal. No respiratory distress. She has no wheezes. She has no rales.  Abdominal: Soft. She exhibits no distension. There is no tenderness. There is no rebound and no guarding.  Genitourinary: Cervix exhibits no motion tenderness, no discharge and no friability. Right adnexum displays no mass, no tenderness and no fullness. Left adnexum displays no mass, no tenderness and no fullness. There is bleeding (small amount of blood in the vaginal vault) in the vagina. No vaginal discharge found.  Genitourinary Comments: RN Shanda Bumps present as chaperone. Cervical os is closed  Neurological: She is alert.  Clear speech.   Skin: Skin is warm and dry. No rash noted.  Psychiatric: She has a normal mood and affect. Her behavior is normal.  Nursing note and vitals reviewed.   ED Treatments / Results  Labs Results for orders placed or performed during the hospital encounter of 12/12/17  Wet  prep, genital  Result Value Ref Range   Yeast Wet Prep HPF POC NONE SEEN NONE SEEN   Trich, Wet Prep NONE SEEN NONE SEEN   Clue Cells Wet Prep HPF POC PRESENT (A) NONE SEEN   WBC, Wet Prep HPF POC MODERATE (A) NONE SEEN   Sperm NONE SEEN   Comprehensive metabolic panel  Result Value Ref Range   Sodium 137 135 - 145 mmol/L   Potassium 4.6 3.5 - 5.1 mmol/L   Chloride 105 101 - 111 mmol/L   CO2 26 22 - 32 mmol/L   Glucose, Bld 91 65 - 99 mg/dL   BUN 7 6 - 20 mg/dL   Creatinine, Ser 1.61 0.44 -  1.00 mg/dL   Calcium 8.8 (L) 8.9 - 10.3 mg/dL   Total Protein 6.8 6.5 - 8.1 g/dL   Albumin 3.4 (L) 3.5 - 5.0 g/dL   AST 15 15 - 41 U/L   ALT 10 (L) 14 - 54 U/L   Alkaline Phosphatase 85 38 - 126 U/L   Total Bilirubin 0.6 0.3 - 1.2 mg/dL   GFR calc non Af Amer >60 >60 mL/min   GFR calc Af Amer >60 >60 mL/min   Anion gap 6 5 - 15  CBC with Differential  Result Value Ref Range   WBC 10.2 4.0 - 10.5 K/uL   RBC 4.50 3.87 - 5.11 MIL/uL   Hemoglobin 12.8 12.0 - 15.0 g/dL   HCT 16.1 09.6 - 04.5 %   MCV 86.2 78.0 - 100.0 fL   MCH 28.4 26.0 - 34.0 pg   MCHC 33.0 30.0 - 36.0 g/dL   RDW 40.9 81.1 - 91.4 %   Platelets 273 150 - 400 K/uL   Neutrophils Relative % 60 %   Neutro Abs 6.1 1.7 - 7.7 K/uL   Lymphocytes Relative 33 %   Lymphs Abs 3.3 0.7 - 4.0 K/uL   Monocytes Relative 6 %   Monocytes Absolute 0.6 0.1 - 1.0 K/uL   Eosinophils Relative 1 %   Eosinophils Absolute 0.1 0.0 - 0.7 K/uL   Basophils Relative 0 %   Basophils Absolute 0.0 0.0 - 0.1 K/uL  hCG, quantitative, pregnancy  Result Value Ref Range   hCG, Beta Chain, Quant, S 58 (H) <5 mIU/mL  Urinalysis, Routine w reflex microscopic  Result Value Ref Range   Color, Urine YELLOW YELLOW   APPearance CLOUDY (A) CLEAR   Specific Gravity, Urine 1.027 1.005 - 1.030   pH 5.0 5.0 - 8.0   Glucose, UA NEGATIVE NEGATIVE mg/dL   Hgb urine dipstick LARGE (A) NEGATIVE   Bilirubin Urine NEGATIVE NEGATIVE   Ketones, ur NEGATIVE NEGATIVE mg/dL     Protein, ur NEGATIVE NEGATIVE mg/dL   Nitrite NEGATIVE NEGATIVE   Leukocytes, UA NEGATIVE NEGATIVE   RBC / HPF 11-20 0 - 5 RBC/hpf   WBC, UA 0-5 0 - 5 WBC/hpf   Bacteria, UA RARE (A) NONE SEEN   Squamous Epithelial / LPF 11-20 0 - 5   Mucus PRESENT   POC Urine Pregnancy, ED (do NOT order at [facility])  Result Value Ref Range   Preg Test, Ur POSITIVE (A) NEGATIVE   No results found. EKG None  Radiology No results found.  Procedures Procedures (including critical care time)  Medications Ordered in ED Medications - No data to display   Initial Impression / Assessment and Plan / ED Course  I have reviewed the triage vital signs and the nursing notes.  Pertinent labs & imaging results that were available during my care of the patient were reviewed by me and considered in my medical decision making (see chart for details).   Patient presents with vaginal bleeding and positive pregnancy tests at home. Patient is nontoxic appearing, in no apparent distress. Exam is fairly benign- no abdominal tenderness or adnexal/cervical motion tenderness. There is a small amount of blood in the vaginal vault. Cervical os is closed. With positive urine pregnancy test and vaginal bleeding with patient reported mild pelvic discomfort (nontender on exam) will evaluate with quantitative hCG and basic labs.   Quantitative hCG of 58. Difficult to determine early pregnancy vs. spontaenous abortion- feel threatened abortion is more likely at this time. Ectopic pregnancy  remains a possibility however lower suspicion for this right now. No tenderness on abdominal or pelvic exam. Patient is not anemic, hgb 12.8 consistent with baseline, she is hemodynamically stable.  Patient is Rh positive. Discussed with supervising physician Dr. Effie Shy- instructs 48 hour follow up with MAU for quant hCG recheck with strict return precautions for symptoms of anemia and infection. I discussed results, treatment plan, need for MAU  follow-up, and strict return precautions with the patient. Provided opportunity for questions, patient confirmed understanding and is in agreement with plan.   Final Clinical Impressions(s) / ED Diagnoses   Final diagnoses:  Pregnancy of unknown anatomic location  Vaginal bleeding in pregnancy    ED Discharge Orders    None       Cherly Anderson, PA-C 12/12/17 1741    Mancel Bale, MD 12/12/17 2154

## 2017-12-12 NOTE — ED Notes (Signed)
ED Provider at bedside. 

## 2017-12-12 NOTE — ED Notes (Signed)
Pt discharged from ED; instructions provided; Pt encouraged to return to ED if symptoms worsen and to f/u with PCP; Pt verbalized understanding of all instructions 

## 2017-12-12 NOTE — Discharge Instructions (Signed)
Your urine pregnancy test in the emergency department today was positive.  We checked your quantitative pregnancy blood level and this was 58.  This is fairly low.  As discussed it is possible that you are very early in pregnancy, it is also possible that you are having a miscarriage.  With this being said it is very important that you follow-up at the MAU in 48 hours for a recheck of your quantitative hCG (pregnancy hormone).  Return to the ER at any time for any new or worsening symptoms including but not limited to worsening bleeding, worsening pain, dizziness, lightheadedness, passing out, chest pain, trouble breathing, fever, or any other concerns.  We recommend that you return as the symptoms are signs of possible complications such as infection and losing too much blood.  Ensure to follow-up in 48 hours and return for any concerns.  Continue your prenatal vitamin.  Continue pelvic rest, no intercourse, no vigorous activity, relax as much as possible.

## 2017-12-12 NOTE — ED Triage Notes (Signed)
Pt. Stated, I had my period last week and Im spotting all the time. I took 4 pregnancy test and they were positive

## 2017-12-14 ENCOUNTER — Inpatient Hospital Stay (HOSPITAL_COMMUNITY): Payer: Medicaid Other

## 2017-12-14 ENCOUNTER — Ambulatory Visit: Payer: Medicaid Other

## 2017-12-14 ENCOUNTER — Inpatient Hospital Stay (HOSPITAL_COMMUNITY)
Admission: AD | Admit: 2017-12-14 | Discharge: 2017-12-14 | Disposition: A | Discharge status: Home / Self Care | Payer: Medicaid Other | Source: Ambulatory Visit | Attending: Obstetrics & Gynecology | Admitting: Obstetrics & Gynecology

## 2017-12-14 DIAGNOSIS — Z3A01 Less than 8 weeks gestation of pregnancy: Secondary | ICD-10-CM | POA: Diagnosis not present

## 2017-12-14 DIAGNOSIS — O209 Hemorrhage in early pregnancy, unspecified: Secondary | ICD-10-CM | POA: Insufficient documentation

## 2017-12-14 DIAGNOSIS — O4691 Antepartum hemorrhage, unspecified, first trimester: Secondary | ICD-10-CM

## 2017-12-14 DIAGNOSIS — O3680X0 Pregnancy with inconclusive fetal viability, not applicable or unspecified: Secondary | ICD-10-CM

## 2017-12-14 DIAGNOSIS — O4690 Antepartum hemorrhage, unspecified, unspecified trimester: Secondary | ICD-10-CM

## 2017-12-14 LAB — HCG, QUANTITATIVE, PREGNANCY: hCG, Beta Chain, Quant, S: 29 m[IU]/mL — ABNORMAL HIGH (ref <5)

## 2017-12-14 LAB — GC/CHLAMYDIA PROBE AMP (~~LOC~~) NOT AT ARMC
CHLAMYDIA, DNA PROBE: NEGATIVE
Neisseria Gonorrhea: NEGATIVE

## 2017-12-14 NOTE — MAU Provider Note (Signed)
Jodi Terrell  is a 22 y.o. G2P1011 at Unknown who presents to MAU today for follow-up quant hCG after 48 hours. The patient was seen in [facility] for cramping and bleeding on 12/12/17 and had quant hCG of 58 and [place] was not performed. She denies pain, vaginal bleeding or fever today.   OB History  Gravida Para Term Preterm AB Living  SAB TAB Ectopic Multiple Live Births  1       1    # Outcome Date GA Lbr Len/2nd Weight Sex Delivery Anes PTL Lv  2 Term 05/21/14 [redacted]w[redacted]d 12:49 / 00:16 7 lb 4.9 oz (3.314 kg) F Vag-Spont EPI  LIV  1 SAB 2014             Birth Comments: System Generated. Please review and update pregnancy details.    Past Medical History:  Diagnosis Date  . Arrhythmia    with pneumonia  . Heart murmur    as a child  . Pneumonia    09/01/14- 3- 4 years ago  . Seasonal allergies     ROS: no VB no pain  BP 125/78   Pulse 80   Temp 98.8 F (37.1 C)   Resp 18   Ht  (1.549 m)   Wt 228 lb (103.4 kg)   LMP 12/05/2017   BMI 43.08 kg/m   CONSTITUTIONAL: Well-developed, well-nourished female in no acute distress.  MUSCULOSKELETAL: Normal range of motion.  CARDIOVASCULAR: Regular heart rate RESPIRATORY: Normal effort NEUROLOGICAL: Alert and oriented to person, place, and time.  SKIN: Not diaphoretic. No erythema. No pallor. PSYCH: Normal mood and affect. Normal behavior. Normal judgment and thought content.  Results for orders placed or performed during the hospital encounter of 12/14/17 (from the past 24 hour(s))  hCG, quantitative, pregnancy     Status: Abnormal   Collection Time: 12/14/17  1:36 PM  Result Value Ref Range   hCG, Beta Chain, Quant, S 29 (H) <5 mIU/mL   US Ob Less Than 14 Weeks With Ob Transvaginal  Result Date: 12/14/2017 CLINICAL DATA:  Vaginal bleeding in first trimester of pregnancy; quantitative beta HCG = 58 on 12/12/2017, = 29 on 12/14/2017 EXAM: OBSTETRIC <14 WK [place] AND TRANSVAGINAL OB US TECHNIQUE: Both transabdominal and  transvaginal ultrasound examinations were performed for complete evaluation of the gestation as well as the maternal uterus, adnexal regions, and pelvic cul-de-sac. Transvaginal technique was performed to assess early pregnancy. COMPARISON:  None for this gestation FINDINGS: Intrauterine gestational sac: None identified Yolk sac:  N/A Embryo:  N/A Cardiac Activity: N/A Heart Rate: N/A  bpm MSD:   mm    w     d CRL:    mm    w    d                  [place] EDC: Subchorionic hemorrhage:  N/A Maternal uterus/adnexae: Uterus normal morphology without mass or visualization of a gestational sac. RIGHT ovary measures 4.6 x 3.1 x 3.4 cm and contains a corpus luteal cyst 3.2 x 3.1 x 3.0 cm. LEFT ovary normal size and morphology 2.0 x 1.4 x 1.9 cm. No adnexal masses or free pelvic fluid. IMPRESSION: No intrauterine gestation identified. Findings are compatible with pregnancy of unknown location. Differential diagnosis includes early intrauterine pregnancy too early to visualize, spontaneous abortion, and ectopic pregnancy. Serial quantitative beta hCG and or followup ultrasound recommended to definitively exclude ectopic pregnancy. Electronically Signed  By: Ulyses Southward M.D.   On: 12/14/2017 14:41   MDM: [place] ordered and reviewed since not previously completed. No IUP or adnexal mass seen on [place] and quant HCG is falling which most likely indicates SAB but cannot r/o ectopic. Will follow 1 more quant HCG in 2 days to confirm. Discussed results with pt, support provided. Stable for discharge home.  A: 1. Pregnancy, location unknown   2. Vaginal bleeding in pregnancy    P: Discharge home First trimester/ectopic precautions discussed Patient will return for follow-up quant HCG in [facility] on 12/16/17 Patient may return to MAU as needed or if her condition were to change or worsen   Donette Larry, [place] 12/14/2017 3:01 PM

## 2017-12-14 NOTE — MAU Note (Signed)
Pt was seen at [facility] on Saturday for vag bleeding . Told she was pregnant  And to f/u at MAU in 48 hours. Pt denies any  Vag bleeding or pain at this time.

## 2017-12-16 ENCOUNTER — Ambulatory Visit: Payer: Medicaid Other | Admitting: *Deleted

## 2018-01-22 ENCOUNTER — Encounter (HOSPITAL_COMMUNITY): Payer: Self-pay | Admitting: Emergency Medicine

## 2018-01-22 ENCOUNTER — Emergency Department (HOSPITAL_COMMUNITY)
Admission: EM | Admit: 2018-01-22 | Discharge: 2018-01-23 | Disposition: A | Discharge status: Home / Self Care | Payer: Medicaid Other | Attending: Emergency Medicine | Admitting: Emergency Medicine

## 2018-01-22 ENCOUNTER — Other Ambulatory Visit: Payer: Self-pay

## 2018-01-22 DIAGNOSIS — M7918 Myalgia, other site: Secondary | ICD-10-CM | POA: Diagnosis not present

## 2018-01-22 DIAGNOSIS — R509 Fever, unspecified: Secondary | ICD-10-CM | POA: Diagnosis not present

## 2018-01-22 DIAGNOSIS — R197 Diarrhea, unspecified: Secondary | ICD-10-CM | POA: Diagnosis not present

## 2018-01-22 DIAGNOSIS — R1110 Vomiting, unspecified: Secondary | ICD-10-CM | POA: Diagnosis present

## 2018-01-22 DIAGNOSIS — R112 Nausea with vomiting, unspecified: Secondary | ICD-10-CM | POA: Diagnosis not present

## 2018-01-22 DIAGNOSIS — Z87891 Personal history of nicotine dependence: Secondary | ICD-10-CM | POA: Diagnosis not present

## 2018-01-22 LAB — COMPREHENSIVE METABOLIC PANEL
ALBUMIN: 3.8 g/dL (ref 3.5–5.0)
ALK PHOS: 88 U/L (ref 38–126)
ALT: 13 U/L — ABNORMAL LOW (ref 14–54)
AST: 20 U/L (ref 15–41)
Anion gap: 7 (ref 5–15)
BILIRUBIN TOTAL: 1 mg/dL (ref 0.3–1.2)
BUN: 9 mg/dL (ref 6–20)
CALCIUM: 8.5 mg/dL — AB (ref 8.9–10.3)
CO2: 28 mmol/L (ref 22–32)
Chloride: 105 mmol/L (ref 101–111)
Creatinine, Ser: 0.67 mg/dL (ref 0.44–1.00)
GFR calc non Af Amer: >60 mL/min (ref >60)
GLUCOSE: 97 mg/dL (ref 65–99)
POTASSIUM: 2.9 mmol/L — AB (ref 3.5–5.1)
SODIUM: 140 mmol/L (ref 135–145)
TOTAL PROTEIN: 7.2 g/dL (ref 6.5–8.1)

## 2018-01-22 LAB — CBC
HEMATOCRIT: 35.4 % — AB (ref 36.0–46.0)
HEMOGLOBIN: 11.9 g/dL — AB (ref 12.0–15.0)
MCH: 29.2 pg (ref 26.0–34.0)
MCHC: 33.6 g/dL (ref 30.0–36.0)
MCV: 86.8 fL (ref 78.0–100.0)
Platelets: 186 10*3/uL (ref 150–400)
RBC: 4.08 MIL/uL (ref 3.87–5.11)
RDW: 12.9 % (ref 11.5–15.5)
WBC: 7.9 10*3/uL (ref 4.0–10.5)

## 2018-01-22 LAB — URINALYSIS, ROUTINE W REFLEX MICROSCOPIC
BACTERIA UA: NONE SEEN
Bilirubin Urine: NEGATIVE
GLUCOSE, UA: NEGATIVE mg/dL
HGB URINE DIPSTICK: NEGATIVE
KETONES UR: NEGATIVE mg/dL
Leukocytes, UA: NEGATIVE
NITRITE: NEGATIVE
PROTEIN: 30 mg/dL — AB
Specific Gravity, Urine: 1.033 — ABNORMAL HIGH (ref 1.005–1.030)
pH: 5 (ref 5.0–8.0)

## 2018-01-22 LAB — I-STAT BETA HCG BLOOD, ED (MC, WL, AP ONLY)

## 2018-01-22 LAB — LIPASE, BLOOD: Lipase: 17 U/L (ref 11–51)

## 2018-01-23 MED ORDER — ONDANSETRON 8 MG PO TBDP
8.0000 mg | ORAL_TABLET | Freq: Once | ORAL | Status: AC
  Administered 2018-01-23: 8 mg via ORAL
  Filled 2018-01-23: qty 1

## 2018-01-23 MED ORDER — ONDANSETRON 4 MG PO TBDP: 4.0000 mg | ORAL_TABLET | Freq: Three times a day (TID) | ORAL | 0 refills | Status: DC | PRN

## 2018-01-23 NOTE — ED Provider Notes (Signed)
[facility]-EMERGENCY DEPT Provider Note   CSN: [id number] Arrival date & time: 01/22/18  2056     History   Chief Complaint Chief Complaint  Patient presents with  . Emesis  . Generalized Body Aches  . Fever    HPI Jodi Terrell is a 22 y.o. female.  Patient presents to the emergency department with a chief complaint of generalized body aches.  She reports associated fevers and chills for the past day or 2.  She also reports having nausea, vomiting, diarrhea.  She denies seeing any blood in her vomit or stool.  She denies any localizing abdominal pain.  Denies any dysuria.  She states that she had a miscarriage about a month ago.  She denies any associated pelvic pain with today's symptoms.  She works in a nursing home.  The history is provided by the patient. No language interpreter was used.    Past Medical History:  Diagnosis Date  . Arrhythmia    with pneumonia  . Heart murmur    as a child  . Pneumonia    09/01/14- 3- 4 years ago  . Seasonal allergies     Patient Active Problem List   Diagnosis Date Noted  . Premature rupture of membranes 05/21/2014  . Hemorrhage affecting pregnancy in second trimester 01/25/2014  . Abnormal antenatal AFP screen 12/07/2013  . Obesity complicating pregnancy 11/30/2013  . Supervision of normal first teen pregnancy 11/01/2013    Past Surgical History:  Procedure Laterality Date  . CLOSED REDUCTION NASAL FRACTURE N/A 09/01/2014   Procedure: CLOSED REDUCTION NASAL FRACTURE;  Surgeon: Glenna Fellows, MD;  Location: [facility] OR;  Service: Plastics;  Laterality: N/A;  . TYMPANOSTOMY TUBE PLACEMENT       OB History    Gravida  2   Para  1   Term  1   Preterm      AB  1   Living  1     SAB  1   TAB      Ectopic      Multiple      Live Births  1            Home Medications    Prior to Admission medications   Medication Sig Start Date End Date Taking? Authorizing Provider  Prenatal  Vit-Fe Fumarate-FA (PRENATAL MULTIVITAMIN) TABS tablet Take 1 tablet by mouth daily at 12 noon.    [provider]    Family History Family History  Problem Relation Age of Onset  . Diabetes Maternal Grandfather   . Cancer Paternal Grandmother   . Diabetes Paternal Grandfather     Social History Social History   Tobacco Use  . Smoking status: Former Smoker    Types: Cigarettes    Last attempt to quit: 11/02/2010    Years since quitting: 7.2  . Smokeless tobacco: Never Used  Substance Use Topics  . Alcohol use: No  . Drug use: No     Allergies   Patient has no known allergies.   Review of Systems Review of Systems  All other systems reviewed and are negative.    Physical Exam Updated Vital Signs BP 116/81 (BP Location: Left Arm)   Pulse 100   Temp 99.3 F (37.4 C) (Oral)   Resp 18   Ht 5\' 1"  (1.549 m)   Wt 102.6 kg (226 lb 4.8 oz)   LMP 01/04/2018   SpO2 98%   BMI 42.76 kg/m   Physical Exam  Constitutional: She is oriented to person, place, and time. She appears well-developed and well-nourished.  HENT:  Head: Normocephalic and atraumatic.  Eyes: Pupils are equal, round, and reactive to light. Conjunctivae and EOM are normal.  Neck: Normal range of motion. Neck supple.  Cardiovascular: Normal rate and regular rhythm. Exam reveals no gallop and no friction rub.  No murmur heard. Pulmonary/Chest: Effort normal and breath sounds normal. No respiratory distress. She has no wheezes. She has no rales. She exhibits no tenderness.  Abdominal: Soft. Bowel sounds are normal. She exhibits no distension and no mass. There is no tenderness. There is no rebound and no guarding.  Abdomen is soft and nontender, no Murphy sign, no tenderness at McBurney's point  Musculoskeletal: Normal range of motion. She exhibits no edema or tenderness.  Neurological: She is alert and oriented to person, place, and time.  Skin: Skin is warm and dry.  Psychiatric: She has a  normal mood and affect. Her behavior is normal. Judgment and thought content normal.  Nursing note and vitals reviewed.    ED Treatments / Results  Labs (all labs ordered are listed, but only abnormal results are displayed) Labs Reviewed  COMPREHENSIVE METABOLIC PANEL - Abnormal; Notable for the following components:      Result Value   Potassium 2.9 (*)    Calcium 8.5 (*)    ALT 13 (*)    All other components within normal limits  CBC - Abnormal; Notable for the following components:   Hemoglobin 11.9 (*)    HCT 35.4 (*)    All other components within normal limits  URINALYSIS, ROUTINE W REFLEX MICROSCOPIC - Abnormal; Notable for the following components:   Color, Urine AMBER (*)    APPearance HAZY (*)    Specific Gravity, Urine 1.033 (*)    Protein, ur 30 (*)    All other components within normal limits  LIPASE, BLOOD  I-STAT BETA HCG BLOOD, ED ([facility], [facility], [facility] ONLY)    EKG None  Radiology No results found.  Procedures Procedures (including critical care time)  Medications Ordered in ED Medications  ondansetron (ZOFRAN-ODT) disintegrating tablet 8 mg (has no administration in time range)     Initial Impression / Assessment and Plan / ED Course  I have reviewed the triage vital signs and the nursing notes.  Pertinent labs & imaging results that were available during my care of the patient were reviewed by me and considered in my medical decision making (see chart for details).     Patient with low-grade fever, generalized body aches, nausea, vomiting, diarrhea.  Abdomen is soft and nontender.  Pregnancy test negative.  Laboratory work-up is reassuring.  Patient is overall well in appearance.  Vital signs are stable.  Will treat with Zofran.  Recommend PCP follow-up.  Return precautions given.  Final Clinical Impressions(s) / ED Diagnoses   Final diagnoses:  Nausea vomiting and diarrhea    ED Discharge Orders        Ordered    ondansetron (ZOFRAN ODT) 4 MG  disintegrating tablet  Every 8 hours PRN     01/23/18 0114       Roxy HorsemanBrowning, Tremell Reimers, PA-C 01/23/18 0114    Gilda CreasePollina, Christopher J, MD 01/23/18 [phone]

## 2018-02-25 ENCOUNTER — Encounter (HOSPITAL_COMMUNITY): Payer: Self-pay | Admitting: *Deleted

## 2018-02-25 ENCOUNTER — Inpatient Hospital Stay (HOSPITAL_COMMUNITY): Payer: Medicaid Other

## 2018-02-25 ENCOUNTER — Inpatient Hospital Stay (HOSPITAL_COMMUNITY)
Admission: AD | Admit: 2018-02-25 | Discharge: 2018-02-25 | Disposition: A | Discharge status: Home / Self Care | Payer: Medicaid Other | Source: Ambulatory Visit | Attending: Obstetrics & Gynecology | Admitting: Obstetrics & Gynecology

## 2018-02-25 ENCOUNTER — Other Ambulatory Visit: Payer: Self-pay

## 2018-02-25 DIAGNOSIS — O26891 Other specified pregnancy related conditions, first trimester: Secondary | ICD-10-CM

## 2018-02-25 DIAGNOSIS — R109 Unspecified abdominal pain: Secondary | ICD-10-CM

## 2018-02-25 DIAGNOSIS — O209 Hemorrhage in early pregnancy, unspecified: Secondary | ICD-10-CM | POA: Diagnosis not present

## 2018-02-25 DIAGNOSIS — Z87891 Personal history of nicotine dependence: Secondary | ICD-10-CM | POA: Insufficient documentation

## 2018-02-25 DIAGNOSIS — Z79899 Other long term (current) drug therapy: Secondary | ICD-10-CM | POA: Diagnosis not present

## 2018-02-25 DIAGNOSIS — Z3491 Encounter for supervision of normal pregnancy, unspecified, first trimester: Secondary | ICD-10-CM

## 2018-02-25 DIAGNOSIS — O26899 Other specified pregnancy related conditions, unspecified trimester: Secondary | ICD-10-CM

## 2018-02-25 DIAGNOSIS — Z3A01 Less than 8 weeks gestation of pregnancy: Secondary | ICD-10-CM | POA: Insufficient documentation

## 2018-02-25 DIAGNOSIS — R102 Pelvic and perineal pain: Secondary | ICD-10-CM | POA: Insufficient documentation

## 2018-02-25 LAB — URINALYSIS, ROUTINE W REFLEX MICROSCOPIC
Bilirubin Urine: NEGATIVE
Glucose, UA: NEGATIVE mg/dL
HGB URINE DIPSTICK: NEGATIVE
Ketones, ur: NEGATIVE mg/dL
Leukocytes, UA: NEGATIVE
Nitrite: NEGATIVE
PROTEIN: NEGATIVE mg/dL
SPECIFIC GRAVITY, URINE: 1.026 (ref 1.005–1.030)
pH: 6 (ref 5.0–8.0)

## 2018-02-25 LAB — CBC
HEMATOCRIT: 36.6 % (ref 36.0–46.0)
HEMOGLOBIN: 12.6 g/dL (ref 12.0–15.0)
MCH: 29 pg (ref 26.0–34.0)
MCHC: 34.4 g/dL (ref 30.0–36.0)
MCV: 84.1 fL (ref 78.0–100.0)
Platelets: 234 10*3/uL (ref 150–400)
RBC: 4.35 MIL/uL (ref 3.87–5.11)
RDW: 13.3 % (ref 11.5–15.5)
WBC: 9.9 10*3/uL (ref 4.0–10.5)

## 2018-02-25 LAB — WET PREP, GENITAL
Clue Cells Wet Prep HPF POC: NONE SEEN
Sperm: NONE SEEN
Trich, Wet Prep: NONE SEEN
WBC WET PREP: NONE SEEN
YEAST WET PREP: NONE SEEN

## 2018-02-25 LAB — POCT PREGNANCY, URINE: Preg Test, Ur: POSITIVE — AB

## 2018-02-25 LAB — HCG, QUANTITATIVE, PREGNANCY: hCG, Beta Chain, Quant, S: 37693 m[IU]/mL — ABNORMAL HIGH (ref <5)

## 2018-02-25 NOTE — MAU Note (Signed)
+  HPT Couple wks ago.  Has been having some cramping, had some spotting a couple days ago.

## 2018-02-25 NOTE — MAU Provider Note (Signed)
History     CSN: [id number]  Arrival date and time: 02/25/18 1432   First Provider Initiated Contact with Patient 02/25/18 1628      Chief Complaint  Patient presents with  . Vaginal Bleeding  . Abdominal Pain  . Possible Pregnancy   [id number] @[redacted]w[redacted]d  by LMP here with abdominal cramping and spotting 2 weeks ago. Not currently having sx but is worried since she has hx of SAB. Denies urinary sx.    OB History    Gravida  4   Para  1   Term  1   Preterm      AB  2   Living  1     SAB  2   TAB      Ectopic      Multiple      Live Births  1           Past Medical History:  Diagnosis Date  . Arrhythmia    with pneumonia  . Heart murmur    as a child  . Pneumonia    09/01/14- 3- 4 years ago  . Seasonal allergies     Past Surgical History:  Procedure Laterality Date  . CLOSED REDUCTION NASAL FRACTURE N/A 09/01/2014   Procedure: CLOSED REDUCTION NASAL FRACTURE;  Surgeon: Glenna FellowsBrinda Thimmappa, MD;  Location: [facility] OR;  Service: Plastics;  Laterality: N/A;  . TYMPANOSTOMY TUBE PLACEMENT      Family History  Problem Relation Age of Onset  . Diabetes Maternal Grandfather   . Cancer Paternal Grandmother   . Diabetes Paternal Grandfather     Social History   Tobacco Use  . Smoking status: Former Smoker    Types: Cigarettes    Last attempt to quit: 11/02/2010    Years since quitting: 7.3  . Smokeless tobacco: Never Used  Substance Use Topics  . Alcohol use: No  . Drug use: No    Allergies: No Known Allergies  Medications Prior to Admission  Medication Sig Dispense Refill Last Dose  . ondansetron (ZOFRAN ODT) 4 MG disintegrating tablet Take 1 tablet (4 mg total) by mouth every 8 (eight) hours as needed for nausea or vomiting. 10 tablet 0   . Prenatal Vit-Fe Fumarate-FA (PRENATAL MULTIVITAMIN) TABS tablet Take 1 tablet by mouth daily at 12 noon.   12/12/2017 at Unknown time    Review of Systems  Gastrointestinal: Negative for abdominal pain.   Genitourinary: Negative for dysuria, vaginal bleeding and vaginal discharge.   Physical Exam   Blood pressure 121/70, pulse 84, temperature 98.5 F (36.9 C), temperature source Oral, resp. rate 17, weight 227 lb 8 oz (103.2 kg), last menstrual period 01/07/2018, SpO2 99 %.  Physical Exam  Constitutional: She is oriented to person, place, and time. She appears well-developed and well-nourished. No distress.  HENT:  Head: Normocephalic and atraumatic.  Neck: Normal range of motion.  Cardiovascular: Normal rate.  Respiratory: Effort normal. No respiratory distress.  GI: Soft. She exhibits no distension and no mass. There is no tenderness. There is no rebound and no guarding.  Musculoskeletal: Normal range of motion.  Neurological: She is alert and oriented to person, place, and time.  Skin: Skin is warm and dry.  Psychiatric: She has a normal mood and affect.   Results for orders placed or performed during the hospital encounter of 02/25/18 (from the past 24 hour(s))  Urinalysis, Routine w reflex microscopic     Status: None   Collection Time: 02/25/18  2:45 PM  Result  Value Ref Range   Color, Urine YELLOW YELLOW   APPearance CLEAR CLEAR   Specific Gravity, Urine 1.026 1.005 - 1.030   pH 6.0 5.0 - 8.0   Glucose, UA NEGATIVE NEGATIVE mg/dL   Hgb urine dipstick NEGATIVE NEGATIVE   Bilirubin Urine NEGATIVE NEGATIVE   Ketones, ur NEGATIVE NEGATIVE mg/dL   Protein, ur NEGATIVE NEGATIVE mg/dL   Nitrite NEGATIVE NEGATIVE   Leukocytes, UA NEGATIVE NEGATIVE  Pregnancy, urine POC     Status: Abnormal   Collection Time: 02/25/18  2:55 PM  Result Value Ref Range   Preg Test, Ur POSITIVE (A) NEGATIVE  CBC     Status: None   Collection Time: 02/25/18  3:42 PM  Result Value Ref Range   WBC 9.9 4.0 - 10.5 K/uL   RBC 4.35 3.87 - 5.11 MIL/uL   Hemoglobin 12.6 12.0 - 15.0 g/dL   HCT 40.9 81.1 - 91.4 %   MCV 84.1 78.0 - 100.0 fL   MCH 29.0 26.0 - 34.0 pg   MCHC 34.4 30.0 - 36.0 g/dL   RDW  78.2 95.6 - 21.3 %   Platelets 234 150 - 400 K/uL  hCG, quantitative, pregnancy     Status: Abnormal   Collection Time: 02/25/18  3:42 PM  Result Value Ref Range   hCG, Beta Chain, Quant, S 08,657 (H) <5 mIU/mL  Wet prep, genital     Status: None   Collection Time: 02/25/18  4:51 PM  Result Value Ref Range   Yeast Wet Prep HPF POC NONE SEEN NONE SEEN   Trich, Wet Prep NONE SEEN NONE SEEN   Clue Cells Wet Prep HPF POC NONE SEEN NONE SEEN   WBC, Wet Prep HPF POC NONE SEEN NONE SEEN   Sperm NONE SEEN    US Ob Transvaginal  Addendum Date: 02/25/2018   ADDENDUM REPORT: 02/25/2018 18:07 ADDENDUM: The findings portion and conclusion portion of the report should intrauterine gestation is 6 weeks and 1 day with CRL of 5.3 mm. Electronically Signed   By: Sherian Rein M.D.   On: 02/25/2018 18:07   Result Date: 02/25/2018 CLINICAL DATA:  Pelvic pain. EXAM: TRANSVAGINAL OB ULTRASOUND TECHNIQUE: Transvaginal ultrasound was performed for complete evaluation of the gestation as well as the maternal uterus, adnexal regions, and pelvic cul-de-sac. COMPARISON:  Dec 14, 2017 FINDINGS: Intrauterine gestational sac: Single Yolk sac:  Present Embryo:  Present Cardiac Activity: Present Heart Rate: 116 bpm MSD:   mm    w     d CRL:   5.3 mm   5 w 3 d                  [place] [facility]: October 20, 2018 Subchorionic hemorrhage:  None visualized. Maternal uterus/adnexae: Right ovary is normal. Left ovary is not visualized. No free fluid is noted. IMPRESSION: Single live intrauterine gestation measuring to 5 weeks and 3 days by crown-rump length without complications. Electronically Signed: By: Sherian Rein M.D. On: 02/25/2018 17:44   MAU Course  Procedures  MDM Labs and [place] ordered and reviewed. No evidence of acute abdominal or pelvic process. Normal IUP on [place]. Discussed findings with pt. Stable for discharge home.  Assessment and Plan   1. Normal intrauterine pregnancy on prenatal ultrasound in first trimester   2.  Abdominal cramping affecting pregnancy    Discharge home Follow up a [facility] as scheduled SAB/bleeding precautions  Allergies as of 02/25/2018   No Known Allergies     Medication List  TAKE these medications   ondansetron 4 MG disintegrating tablet Commonly known as:  ZOFRAN ODT Take 1 tablet (4 mg total) by mouth every 8 (eight) hours as needed for nausea or vomiting.   prenatal multivitamin Tabs tablet Take 1 tablet by mouth daily at 12 noon.      Jodi Terrell, CNM 02/25/2018, 5:14 PM

## 2018-02-25 NOTE — Discharge Instructions (Signed)

## 2018-02-25 NOTE — MAU Note (Addendum)
History     CSN: [id number]  Arrival date and time: 02/25/18 1432   None     Chief Complaint  Patient presents with  . Vaginal Bleeding  . Abdominal Pain  . Possible Pregnancy   Jodi Terrell is a 22 yo F [phone] presenting with hx of positive home pregnancy test 3 months ago and abdominal cramping and spotting two weeks ago. Pt states that she has had 2 miscarriages so was just worried about the cramping and spotting but denies any pain or bleeding today. [facility] and is taking prenatal vitamins. Pt denies vaginal discharge, vaginal bleeding, vaginal pain or burning, dysuria or hematuria.     OB History    Gravida  4   Para  1   Term  1   Preterm      AB  2   Living  1     SAB  2   TAB      Ectopic      Multiple      Live Births  1           Past Medical History:  Diagnosis Date  . Arrhythmia    with pneumonia  . Heart murmur    as a child  . Pneumonia    09/01/14- 3- 4 years ago  . Seasonal allergies     Past Surgical History:  Procedure Laterality Date  . CLOSED REDUCTION NASAL FRACTURE N/A 09/01/2014   Procedure: CLOSED REDUCTION NASAL FRACTURE;  Surgeon: Glenna FellowsBrinda Thimmappa, MD;  Location: [facility] OR;  Service: Plastics;  Laterality: N/A;  . TYMPANOSTOMY TUBE PLACEMENT      Family History  Problem Relation Age of Onset  . Diabetes Maternal Grandfather   . Cancer Paternal Grandmother   . Diabetes Paternal Grandfather     Social History   Tobacco Use  . Smoking status: Former Smoker    Types: Cigarettes    Last attempt to quit: 11/02/2010    Years since quitting: 7.3  . Smokeless tobacco: Never Used  Substance Use Topics  . Alcohol use: No  . Drug use: No    Allergies: No Known Allergies  Medications Prior to Admission  Medication Sig Dispense Refill Last Dose  . ondansetron (ZOFRAN ODT) 4 MG disintegrating tablet Take 1 tablet (4 mg total) by mouth every 8 (eight) hours as needed for nausea or  vomiting. 10 tablet 0   . Prenatal Vit-Fe Fumarate-FA (PRENATAL MULTIVITAMIN) TABS tablet Take 1 tablet by mouth daily at 12 noon.   12/12/2017 at Unknown time    Review of Systems  Constitutional: Negative.   Gastrointestinal: Negative.   Genitourinary: Negative.    Physical Exam   Blood pressure 121/70, pulse 84, temperature 98.5 F (36.9 C), temperature source Oral, resp. rate 17, weight 103.2 kg (227 lb 8 oz), last menstrual period 01/07/2018, SpO2 99 %.  Physical Exam  Constitutional: She is oriented to person, place, and time. She appears well-developed and well-nourished. No distress.  HENT:  Head: Normocephalic and atraumatic.  Eyes: Conjunctivae are normal.  Cardiovascular: Normal rate and normal heart sounds.  Respiratory: Effort normal. No respiratory distress.  GI: Soft. She exhibits no distension. There is no tenderness. There is no rebound and no guarding.  Musculoskeletal: Normal range of motion. She exhibits no edema.  Neurological: She is alert and oriented to person, place, and time.  Skin: Skin is warm and dry.   Recent Results (from the past 2160 hour(s))  Pregnancy, urine POC     Status: Abnormal   Collection Time: 02/25/18  2:55 PM  Result Value Ref Range   Preg Test, Ur POSITIVE (A) NEGATIVE    Comment:        THE SENSITIVITY OF THIS METHODOLOGY IS >24 mIU/mL   CBC     Status: None   Collection Time: 02/25/18  3:42 PM  Result Value Ref Range   WBC 9.9 4.0 - 10.5 K/uL   RBC 4.35 3.87 - 5.11 MIL/uL   Hemoglobin 12.6 12.0 - 15.0 g/dL   HCT 16.1 09.6 - 04.5 %   MCV 84.1 78.0 - 100.0 fL   MCH 29.0 26.0 - 34.0 pg   MCHC 34.4 30.0 - 36.0 g/dL   RDW 40.9 81.1 - 91.4 %   Platelets 234 150 - 400 K/uL    Comment: Performed at [facility], [address], [place], [place] [address]  hCG, quantitative, pregnancy     Status: Abnormal   Collection Time: 02/25/18  3:42 PM  Result Value Ref Range   hCG, Beta Chain, Quant, S 37,693 (H) <5 mIU/mL    Comment:           GEST. AGE      CONC.  (mIU/mL)   <=1 WEEK        5 - 50     2 WEEKS       50 - 500     3 WEEKS       100 - 10,000     4 WEEKS     1,000 - 30,000     5 WEEKS     3,500 - 115,000   6-8 WEEKS     12,000 - 270,000    12 WEEKS     15,000 - 220,000        FEMALE AND NON-PREGNANT FEMALE:     LESS THAN 5 mIU/mL Performed at [facility], [address], [place], [place] [address]   Wet prep, genital     Status: None   Collection Time: 02/25/18  4:51 PM  Result Value Ref Range   Yeast Wet Prep HPF POC NONE SEEN NONE SEEN   Trich, Wet Prep NONE SEEN NONE SEEN   Clue Cells Wet Prep HPF POC NONE SEEN NONE SEEN   WBC, Wet Prep HPF POC NONE SEEN NONE SEEN    Comment: MANY BACTERIA SEEN   Sperm NONE SEEN     Comment: Performed at [facility], [address] [place], [place] [address]    MAU Course  Procedures  Wet Prep G/C b-hCG- 37, 693 CBC Upreg- + UA  MDM Ectopic pregnancy and Spontaneous abortion were ruled out with U/S. U/S found IUP with dating of 106w1d, fetal HR was 116 bpm. All other labs wnl. Pt has prenatal visit schedule at [facility] and is taking prenatal vitamins. Stable to go home.   Assessment and Plan   1. Normal intrauterine pregnancy on prenatal ultrasound in first trimester   2. Abdominal cramping affecting pregnancy    -D/C home -Counseled pt on following up in out pt clinic for prenatal care -recommended to keep taking prenatal vitamin  Rolland Bimler, MS3 02/25/2018, 4:24 PM

## 2018-02-25 NOTE — Progress Notes (Signed)
Patient discharged home.  Needing to leave quickly for [facility] to go to work.  Pt signed paper printout from [facility].  Had no questions and verbalized understanding of discharge instructions.

## 2018-02-26 LAB — GC/CHLAMYDIA PROBE AMP (~~LOC~~) NOT AT ARMC
CHLAMYDIA, DNA PROBE: NEGATIVE
Neisseria Gonorrhea: NEGATIVE

## 2018-02-28 ENCOUNTER — Inpatient Hospital Stay (HOSPITAL_COMMUNITY)
Admission: AD | Admit: 2018-02-28 | Discharge: 2018-02-28 | Disposition: A | Discharge status: Home / Self Care | Payer: Medicaid Other | Source: Ambulatory Visit | Attending: Obstetrics and Gynecology | Admitting: Obstetrics and Gynecology

## 2018-02-28 ENCOUNTER — Encounter (HOSPITAL_COMMUNITY): Payer: Self-pay | Admitting: *Deleted

## 2018-02-28 DIAGNOSIS — O210 Mild hyperemesis gravidarum: Secondary | ICD-10-CM | POA: Insufficient documentation

## 2018-02-28 DIAGNOSIS — Z87891 Personal history of nicotine dependence: Secondary | ICD-10-CM | POA: Insufficient documentation

## 2018-02-28 DIAGNOSIS — Z3A01 Less than 8 weeks gestation of pregnancy: Secondary | ICD-10-CM | POA: Insufficient documentation

## 2018-02-28 DIAGNOSIS — O219 Vomiting of pregnancy, unspecified: Secondary | ICD-10-CM | POA: Diagnosis not present

## 2018-02-28 LAB — URINALYSIS, ROUTINE W REFLEX MICROSCOPIC
Bilirubin Urine: NEGATIVE
GLUCOSE, UA: NEGATIVE mg/dL
HGB URINE DIPSTICK: NEGATIVE
Ketones, ur: NEGATIVE mg/dL
Leukocytes, UA: NEGATIVE
NITRITE: NEGATIVE
PH: 5 (ref 5.0–8.0)
Protein, ur: NEGATIVE mg/dL
SPECIFIC GRAVITY, URINE: 1.026 (ref 1.005–1.030)

## 2018-02-28 LAB — GLUCOSE, CAPILLARY: Glucose-Capillary: 84 mg/dL (ref 70–99)

## 2018-02-28 MED ORDER — PYRIDOXINE HCL 25 MG PO TABS: 25.0000 mg | ORAL_TABLET | Freq: Three times a day (TID) | ORAL | 1 refills | Status: DC | PRN

## 2018-02-28 MED ORDER — ONDANSETRON 4 MG PO TBDP
4.0000 mg | ORAL_TABLET | Freq: Once | ORAL | Status: AC
Start: 2018-02-28 — End: 2018-02-28
  Administered 2018-02-28: 4 mg via ORAL
  Filled 2018-02-28: qty 1

## 2018-02-28 MED ORDER — DOXYLAMINE SUCCINATE (SLEEP) 25 MG PO TABS: 25.0000 mg | ORAL_TABLET | Freq: Three times a day (TID) | ORAL | 1 refills | Status: DC | PRN

## 2018-02-28 NOTE — MAU Provider Note (Signed)
History     CSN: [id number]  Arrival date and time: 02/28/18 1803   First Provider Initiated Contact with Patient 02/28/18 1828      Chief Complaint  Patient presents with  . Emesis   HPI  Jodi Terrell is a 22 y.o. [phone] at [redacted]w[redacted]d who presents to MAU with chief complaint of nausea and vomiting. Patient reports this is an existing problem, onset at 6 weeks of pregnancy. Reports she has been able to tolerate PO intake intermittently but has significantly worse nausea with dry heaving all day today. Also reports one episode of lightheadedness today immediately after vomiting.  Denies vaginal bleeding, leaking of fluid,fever, falls, or recent illness. States she has not attempted management with medication.    OB History    Gravida  4   Para  1   Term  1   Preterm      AB  2   Living  1     SAB  2   TAB      Ectopic      Multiple      Live Births  1           Past Medical History:  Diagnosis Date  . Arrhythmia    with pneumonia  . Heart murmur    as a child  . Pneumonia    09/01/14- 3- 4 years ago  . Seasonal allergies     Past Surgical History:  Procedure Laterality Date  . CLOSED REDUCTION NASAL FRACTURE N/A 09/01/2014   Procedure: CLOSED REDUCTION NASAL FRACTURE;  Surgeon: Glenna Fellows, MD;  Location: [facility] OR;  Service: Plastics;  Laterality: N/A;  . TYMPANOSTOMY TUBE PLACEMENT      Family History  Problem Relation Age of Onset  . Diabetes Maternal Grandfather   . Cancer Paternal Grandmother   . Diabetes Paternal Grandfather     Social History   Tobacco Use  . Smoking status: Former Smoker    Types: Cigarettes    Last attempt to quit: 11/02/2010    Years since quitting: 7.3  . Smokeless tobacco: Never Used  Substance Use Topics  . Alcohol use: No  . Drug use: No    Allergies: No Known Allergies  Medications Prior to Admission  Medication Sig Dispense Refill Last Dose  . ondansetron (ZOFRAN ODT) 4 MG disintegrating tablet  Take 1 tablet (4 mg total) by mouth every 8 (eight) hours as needed for nausea or vomiting. 10 tablet 0   . Prenatal Vit-Fe Fumarate-FA (PRENATAL MULTIVITAMIN) TABS tablet Take 1 tablet by mouth daily at 12 noon.   12/12/2017 at Unknown time    Review of Systems  Constitutional: Positive for fatigue. Negative for chills and fever.  Gastrointestinal: Positive for nausea and vomiting. Negative for abdominal pain.  Genitourinary: Negative for vaginal bleeding, vaginal discharge and vaginal pain.  All other systems reviewed and are negative.  Physical Exam   Blood pressure 111/63, pulse 82, temperature 98 F (36.7 C), temperature source Oral, resp. rate 18, weight 224 lb 0.6 oz (101.6 kg), last menstrual period 01/07/2018, SpO2 99 %.  Physical Exam  Nursing note and vitals reviewed. Constitutional: She is oriented to person, place, and time. She appears well-developed and well-nourished.  HENT:  Head: Normocephalic.  Cardiovascular: Normal rate, regular rhythm, normal heart sounds and intact distal pulses.  Respiratory: Effort normal and breath sounds normal.  GI: Soft. Bowel sounds are normal.  Musculoskeletal: Normal range of motion.  Neurological: She is alert and oriented  to person, place, and time. She has normal reflexes.  Skin: Skin is warm and dry.  Psychiatric: She has a normal mood and affect. Her behavior is normal. Judgment and thought content normal.    MAU Course  Procedures  MDM Hemodynamically stable Nausea managed by Zofran ODT given in MAU Patient not currently taking medication or revising diet to manage bed Stable blood sugar No concerning results on UA  Patient Vitals for the past 24 hrs:  BP Temp Temp src Pulse Resp SpO2 Weight  02/28/18 1815 111/63 98 F (36.7 C) Oral 82 18 99 % 224 lb 0.6 oz (101.6 kg)    Orders Placed This Encounter  Procedures  . Urinalysis, Routine w reflex microscopic   Meds ordered this encounter  Medications  . ondansetron  (ZOFRAN-ODT) disintegrating tablet 4 mg   Results for orders placed or performed during the hospital encounter of 02/28/18 (from the past 24 hour(s))  Urinalysis, Routine w reflex microscopic     Status: Abnormal   Collection Time: 02/28/18  6:19 PM  Result Value Ref Range   Color, Urine AMBER (A) YELLOW   APPearance TURBID (A) CLEAR   Specific Gravity, Urine 1.026 1.005 - 1.030   pH 5.0 5.0 - 8.0   Glucose, UA NEGATIVE NEGATIVE mg/dL   Hgb urine dipstick NEGATIVE NEGATIVE   Bilirubin Urine NEGATIVE NEGATIVE   Ketones, ur NEGATIVE NEGATIVE mg/dL   Protein, ur NEGATIVE NEGATIVE mg/dL   Nitrite NEGATIVE NEGATIVE   Leukocytes, UA NEGATIVE NEGATIVE   RBC / HPF 0-5 0 - 5 RBC/hpf   Bacteria, UA RARE (A) NONE SEEN   Squamous Epithelial / LPF 11-20 0 - 5   Mucus PRESENT    Amorphous Crystal PRESENT   Glucose, capillary     Status: None   Collection Time: 02/28/18  6:43 PM  Result Value Ref Range   Glucose-Capillary 84 70 - 99 mg/dL    Discussed with the patient the risk of Zofran use in the first trimester of pregnancy. Risks of use in the first trimester include birth defects in babies, specifically cleft lip/palate.  Pt states understanding and plans to use Zofran sparingly.    Meds ordered this encounter  Medications  . ondansetron (ZOFRAN-ODT) disintegrating tablet 4 mg  . doxylamine, Sleep, (UNISOM) 25 MG tablet    Sig: Take 1 tablet (25 mg total) by mouth every 8 (eight) hours as needed.    Dispense:  30 tablet    Refill:  1    Order Specific Question:   Supervising Provider    Answer:   Reva BoresPRATT, TANYA S [[id number]]  . pyridOXINE (VITAMIN B-6) 25 MG tablet    Sig: Take 1 tablet (25 mg total) by mouth every 8 (eight) hours as needed.    Dispense:  30 tablet    Refill:  1    Order Specific Question:   Supervising Provider    Answer:   Reva BoresPRATT, TANYA S [[id number]]    Assessment and Plan  --22 y.o. [phone] at 8979w4d  --nausea and vomiting in first trimester --Initiate rx for Doxylamine +  Pyridoxine q 8 hours PRN for nausea  --Discussed diet revision for first trimester n/v --Reviewed general obstetric precautions including but not limited to falls, fever, vaginal bleeding, leaking of    fluid, headache not relieved by Tylenol, rest and PO hydration.  --Discharge home in stable condition  F/U new OB scheduled for 03/22/2018 at [facility]  Calvert CantorSamantha C Weinhold, CNM 02/28/2018, 7:02 PM

## 2018-02-28 NOTE — Discharge Instructions (Signed)

## 2018-02-28 NOTE — MAU Note (Signed)
Jodi Terrell is a 22 y.o. at 867w4d here in MAU reporting:  +emesis States unable to tolerate eating or drinking anything Endorses that she is not currently on any medications for the N/V Onset of complaint: ongoing for the past week Pain score: denies at this time  Does report feeling "shaky" as well as an episode of "almost passing out" at home Vitals:   02/28/18 1815  BP: 111/63  Pulse: 82  Resp: 18  Temp: 98 F (36.7 C)  SpO2: 99%      Lab orders placed from triage: ua

## 2018-03-22 ENCOUNTER — Ambulatory Visit (INDEPENDENT_AMBULATORY_CARE_PROVIDER_SITE_OTHER): Payer: Medicaid Other | Admitting: Obstetrics & Gynecology

## 2018-03-22 ENCOUNTER — Encounter: Payer: Self-pay | Admitting: Obstetrics & Gynecology

## 2018-03-22 ENCOUNTER — Other Ambulatory Visit (HOSPITAL_COMMUNITY)
Admission: RE | Admit: 2018-03-22 | Discharge: 2018-03-22 | Disposition: A | Discharge status: Home / Self Care | Payer: Medicaid Other | Source: Ambulatory Visit | Attending: Obstetrics & Gynecology | Admitting: Obstetrics & Gynecology

## 2018-03-22 VITALS — BP 94/67 | HR 65 | Wt 221.3 lb

## 2018-03-22 DIAGNOSIS — Z3480 Encounter for supervision of other normal pregnancy, unspecified trimester: Secondary | ICD-10-CM | POA: Insufficient documentation

## 2018-03-22 DIAGNOSIS — O219 Vomiting of pregnancy, unspecified: Secondary | ICD-10-CM

## 2018-03-22 DIAGNOSIS — Z3481 Encounter for supervision of other normal pregnancy, first trimester: Secondary | ICD-10-CM | POA: Insufficient documentation

## 2018-03-22 DIAGNOSIS — O99210 Obesity complicating pregnancy, unspecified trimester: Secondary | ICD-10-CM | POA: Diagnosis not present

## 2018-03-22 DIAGNOSIS — O99211 Obesity complicating pregnancy, first trimester: Secondary | ICD-10-CM

## 2018-03-22 HISTORY — DX: Encounter for supervision of other normal pregnancy, unspecified trimester: Z34.80

## 2018-03-22 MED ORDER — PROMETHAZINE HCL 25 MG PO TABS: 25.0000 mg | ORAL_TABLET | Freq: Four times a day (QID) | ORAL | 2 refills | Status: DC | PRN

## 2018-03-22 MED ORDER — METOCLOPRAMIDE HCL 10 MG PO TABS
10.0000 mg | ORAL_TABLET | Freq: Four times a day (QID) | ORAL | 2 refills | Status: DC | PRN
Start: 2018-03-22 — End: 2018-09-22

## 2018-03-22 MED ORDER — ASPIRIN EC 81 MG PO TBEC: 81.0000 mg | DELAYED_RELEASE_TABLET | Freq: Every day | ORAL | 2 refills | Status: DC

## 2018-03-22 NOTE — Patient Instructions (Signed)
Return to clinic for any scheduled appointments or obstetric concerns, or go to [facility] for evaluation   First Trimester of Pregnancy The first trimester of pregnancy is from week 1 until the end of week 13 (months 1 through 3). During this time, your baby will begin to develop inside you. At 6-8 weeks, the eyes and face are formed, and the heartbeat can be seen on ultrasound. At the end of 12 weeks, all the baby's organs are formed. Prenatal care is all the medical care you receive before the birth of your baby. Make sure you get good prenatal care and follow all of your doctor's instructions. Follow these instructions at home: Medicines  Take over-the-counter and prescription medicines only as told by your doctor. Some medicines are safe and some medicines are not safe during pregnancy.  Take a prenatal vitamin that contains at least 600 micrograms (mcg) of folic acid.  If you have trouble pooping (constipation), take medicine that will make your stool soft (stool softener) if your doctor approves. Eating and drinking  Eat regular, healthy meals.  Your doctor will tell you the amount of weight gain that is right for you.  Avoid raw meat and uncooked cheese.  If you feel sick to your stomach (nauseous) or throw up (vomit): ? Eat 4 or 5 small meals a day instead of 3 large meals. ? Try eating a few soda crackers. ? Drink liquids between meals instead of during meals.  To prevent constipation: ? Eat foods that are high in fiber, like fresh fruits and vegetables, whole grains, and beans. ? Drink enough fluids to keep your pee (urine) clear or pale yellow. Activity  Exercise only as told by your doctor. Stop exercising if you have cramps or pain in your lower belly (abdomen) or low back.  Do not exercise if it is too hot, too humid, or if you are in a place of great height (high altitude).  Try to avoid standing for long periods of time. Move your legs often if you must stand in one place  for a long time.  Avoid heavy lifting.  Wear low-heeled shoes. Sit and stand up straight.  You can have sex unless your doctor tells you not to. Relieving pain and discomfort  Wear a good support bra if your breasts are sore.  Take warm water baths (sitz baths) to soothe pain or discomfort caused by hemorrhoids. Use hemorrhoid cream if your doctor says it is okay.  Rest with your legs raised if you have leg cramps or low back pain.  If you have puffy, bulging veins (varicose veins) in your legs: ? Wear support hose or compression stockings as told by your doctor. ? Raise (elevate) your feet for 15 minutes, 3-4 times a day. ? Limit salt in your food. Prenatal care  Schedule your prenatal visits by the twelfth week of pregnancy.  Write down your questions. Take them to your prenatal visits.  Keep all your prenatal visits as told by your doctor. This is important. Safety  Wear your seat belt at all times when driving.  Make a list of emergency phone numbers. The list should include numbers for family, friends, the hospital, and police and fire departments. General instructions  Ask your doctor for a referral to a local prenatal class. Begin classes no later than at the start of month 6 of your pregnancy.  Ask for help if you need counseling or if you need help with nutrition. Your doctor can give you   advice or tell you where to go for help.  Do not use hot tubs, steam rooms, or saunas.  Do not douche or use tampons or scented sanitary pads.  Do not cross your legs for long periods of time.  Avoid all herbs and alcohol. Avoid drugs that are not approved by your doctor.  Do not use any tobacco products, including cigarettes, chewing tobacco, and electronic cigarettes. If you need help quitting, ask your doctor. You may get counseling or other support to help you quit.  Avoid cat litter boxes and soil used by cats. These carry germs that can cause birth defects in the baby  and can cause a loss of your baby (miscarriage) or stillbirth.  Visit your dentist. At home, brush your teeth with a soft toothbrush. Be gentle when you floss. Contact a doctor if:  You are dizzy.  You have mild cramps or pressure in your lower belly.  You have a nagging pain in your belly area.  You continue to feel sick to your stomach, you throw up, or you have watery poop (diarrhea).  You have a bad smelling fluid coming from your vagina.  You have pain when you pee (urinate).  You have increased puffiness (swelling) in your face, hands, legs, or ankles. Get help right away if:  You have a fever.  You are leaking fluid from your vagina.  You have spotting or bleeding from your vagina.  You have very bad belly cramping or pain.  You gain or lose weight rapidly.  You throw up blood. It may look like coffee grounds.  You are around people who have [place] measles, fifth disease, or chickenpox.  You have a very bad headache.  You have shortness of breath.  You have any kind of trauma, such as from a fall or a car accident. Summary  The first trimester of pregnancy is from week 1 until the end of week 13 (months 1 through 3).  To take care of yourself and your unborn baby, you will need to eat healthy meals, take medicines only if your doctor tells you to do so, and do activities that are safe for you and your baby.  Keep all follow-up visits as told by your doctor. This is important as your doctor will have to ensure that your baby is healthy and growing well. This information is not intended to replace advice given to you by your health care provider. Make sure you discuss any questions you have with your health care provider. Document Released: 01/14/2008 Document Revised: 08/05/2016 Document Reviewed: 08/05/2016 Elsevier Interactive Patient Education  2017 [employer]

## 2018-03-22 NOTE — Progress Notes (Signed)
Subjective:   Jodi Terrell is a 22 y.o. [phone] at 743w5d by early ultrasound at 6 weeks being seen today for her first obstetrical visit.  Her obstetrical history is significant for term SVD. Patient does intend to breast feed. Pregnancy history fully reviewed.  Patient reports nausea and vomiting, not alleviated with Unisom and Vitamin B6. Wants other medication for this.  HISTORY: OB History  Gravida Para Term Preterm AB Living  4 1 1  0 2 1  SAB TAB Ectopic Multiple Live Births  2 0 0 0 1    # Outcome Date GA Lbr Len/2nd Weight Sex Delivery Anes PTL Lv  4 Current           3 Term 05/21/14 2450w3d 12:49 / 00:16 7 lb 4.9 oz (3.314 kg) F Vag-Spont EPI  LIV     Name: Swim,GIRL Linden     Apgar1: 7  Apgar5: 8  2 SAB 2014             Birth Comments: System Generated. Please review and update pregnancy details.  1 SAB           Last pap smear was done many years ago and was normal.  Past Medical History:  Diagnosis Date  . Arrhythmia    with pneumonia  . Heart murmur    as a child  . Pneumonia    09/01/14- 3- 4 years ago  . Seasonal allergies    Past Surgical History:  Procedure Laterality Date  . CLOSED REDUCTION NASAL FRACTURE N/A 09/01/2014   Procedure: CLOSED REDUCTION NASAL FRACTURE;  Surgeon: Glenna FellowsBrinda Thimmappa, MD;  Location: [facility] OR;  Service: Plastics;  Laterality: N/A;  . TYMPANOSTOMY TUBE PLACEMENT     Family History  Problem Relation Age of Onset  . Diabetes Maternal Grandfather   . Cancer Paternal Grandmother   . Diabetes Paternal Grandfather    Social History   Tobacco Use  . Smoking status: Former Smoker    Types: Cigarettes    Last attempt to quit: 11/02/2010    Years since quitting: 7.3  . Smokeless tobacco: Never Used  Substance Use Topics  . Alcohol use: No  . Drug use: No   No Known Allergies Current Outpatient Medications on File Prior to Visit  Medication Sig Dispense Refill  . doxylamine, Sleep, (UNISOM) 25 MG tablet Take 1 tablet  (25 mg total) by mouth every 8 (eight) hours as needed. 30 tablet 1  . Prenatal Vit-Fe Fumarate-FA (PRENATAL MULTIVITAMIN) TABS tablet Take 1 tablet by mouth daily at 12 noon.    . pyridOXINE (VITAMIN B-6) 25 MG tablet Take 1 tablet (25 mg total) by mouth every 8 (eight) hours as needed. 30 tablet 1   No current facility-administered medications on file prior to visit.     Review of Systems Pertinent items noted in HPI and remainder of comprehensive ROS otherwise negative.  Exam   Vitals:   03/22/18 1024  BP: 94/67  Pulse: 65  Weight: 221 lb 4.8 oz (100.4 kg)   Fetal Heart Rate (bpm): 166 on u/s  Uterus:     Pelvic Exam: Perineum: no hemorrhoids, normal perineum   Vulva: normal external genitalia, no lesions   Vagina:  normal mucosa, normal discharge   Cervix: no lesions and normal, pap smear done.    Adnexa: normal adnexa and no mass, fullness, tenderness   Bony Pelvis: average  System: General: well-developed, well-nourished female in no acute distress   Breast:  normal  appearance, no masses or tenderness   Skin: normal coloration and turgor, no rashes   Neurologic: oriented, normal, negative, normal mood   Extremities: normal strength, tone, and muscle mass, ROM of all joints is normal   HEENT PERRLA, extraocular movement intact and sclera clear, anicteric   Mouth/Teeth mucous membranes moist, pharynx normal without lesions and dental hygiene good   Neck supple and no masses   Cardiovascular: regular rate and rhythm   Respiratory:  no respiratory distress, normal breath sounds   Abdomen: soft, non-tender; bowel sounds normal; no masses,  no organomegaly    Assessment:   Pregnancy: [id number] Patient Active Problem List   Diagnosis Date Noted  . Supervision of other normal pregnancy, antepartum 03/22/2018  . Maternal morbid obesity, antepartum (HCC) 11/30/2013     Plan:  1. Maternal morbid obesity, antepartum (HCC) [x]  Aspirin 81 mg daily after 12 weeks; discontinue  after 36 weeks [x]  Nutrition consult [x]  Weight gain 11-20 lbs for singleton and 25-35 lbs for twin pregnancy (IOM guidelines) recommended . Higher class of obesity patients recommended to gain closer to lower limit  . Weight loss is associated with adverse outcomes [x]  Baseline and surveillance labs ordered - aspirin EC 81 MG tablet; Take 1 tablet (81 mg total) by mouth daily. Take after 12 weeks for prevention of preeclampsia later in pregnancy  Dispense: 300 tablet; Refill: 2 - Comprehensive metabolic panel - Hemoglobin A1c - TSH - Protein / creatinine ratio, urine - Referral to Nutrition and Diabetes Services  2. Nausea and vomiting of pregnancy, antepartum Antiemetics ordered. If this does not work, may need Zofran. - promethazine (PHENERGAN) 25 MG tablet; Take 1 tablet (25 mg total) by mouth every 6 (six) hours as needed for nausea or vomiting. This can make drowsy  Dispense: 30 tablet; Refill: 2 - metoCLOPramide (REGLAN) 10 MG tablet; Take 1 tablet (10 mg total) by mouth 4 (four) times daily as needed for nausea or vomiting.  Dispense: 30 tablet; Refill: 2  3. Supervision of other normal pregnancy, antepartum - Cytology - PAP - Culture, OB Urine - Cystic Fibrosis Mutation 97 - Hemoglobinopathy evaluation - Obstetric Panel, Including HIV - SMN1 COPY NUMBER ANALYSIS (SMA Carrier Screen) - Vitamin D 25 hydroxy - Genetic Screening Initial labs drawn. Continue prenatal vitamins. Genetic Screening discussed, NIPS: ordered. High risk of low fetal fraction due to BMI and early GA; if this occurs, will recheck next visit. Ultrasound discussed; fetal anatomic survey: to be ordered later. Problem list reviewed and updated. The nature of [facility] with multiple MDs and other Advanced Practice Providers was re-explained to patient (she delivered with us last time also); also emphasized that residents, students are part of our team. Routine obstetric  precautions reviewed. Return in about 4 weeks (around 04/19/2018) for OB Visit.     Jaynie CollinsUGONNA  Mal Asher, MD, FACOG Obstetrician & Gynecologist, [facility], [facility]

## 2018-03-23 LAB — PROTEIN / CREATININE RATIO, URINE
CREATININE, UR: 285.5 mg/dL
PROTEIN UR: 40.9 mg/dL
PROTEIN/CREAT RATIO: 143 mg/g{creat} (ref 0–200)

## 2018-03-24 LAB — CYTOLOGY - PAP: DIAGNOSIS: NEGATIVE

## 2018-03-25 LAB — URINE CULTURE, OB REFLEX

## 2018-03-25 LAB — CULTURE, OB URINE

## 2018-03-30 ENCOUNTER — Other Ambulatory Visit: Payer: Self-pay | Admitting: Obstetrics and Gynecology

## 2018-03-30 DIAGNOSIS — Z3480 Encounter for supervision of other normal pregnancy, unspecified trimester: Secondary | ICD-10-CM

## 2018-03-30 DIAGNOSIS — O09899 Supervision of other high risk pregnancies, unspecified trimester: Secondary | ICD-10-CM

## 2018-03-30 DIAGNOSIS — Z283 Underimmunization status: Secondary | ICD-10-CM

## 2018-03-30 DIAGNOSIS — Z2839 Other underimmunization status: Secondary | ICD-10-CM | POA: Insufficient documentation

## 2018-03-30 DIAGNOSIS — O9989 Other specified diseases and conditions complicating pregnancy, childbirth and the puerperium: Secondary | ICD-10-CM

## 2018-03-30 HISTORY — DX: Other underimmunization status: Z28.39

## 2018-03-30 HISTORY — DX: Supervision of other high risk pregnancies, unspecified trimester: O09.899

## 2018-03-30 LAB — OBSTETRIC PANEL, INCLUDING HIV
Antibody Screen: NEGATIVE
BASOS ABS: 0 10*3/uL (ref 0.0–0.2)
Basos: 0 %
EOS (ABSOLUTE): 0.1 10*3/uL (ref 0.0–0.4)
Eos: 1 %
HEP B S AG: NEGATIVE
HIV Screen 4th Generation wRfx: NONREACTIVE
Hematocrit: 36.9 % (ref 34.0–46.6)
Hemoglobin: 11.5 g/dL (ref 11.1–15.9)
IMMATURE GRANULOCYTES: 0 %
Immature Grans (Abs): 0 10*3/uL (ref 0.0–0.1)
LYMPHS: 26 %
Lymphocytes Absolute: 1.8 10*3/uL (ref 0.7–3.1)
MCH: 26.2 pg — ABNORMAL LOW (ref 26.6–33.0)
MCHC: 31.2 g/dL — ABNORMAL LOW (ref 31.5–35.7)
MCV: 84 fL (ref 79–97)
MONOCYTES: 6 %
Monocytes Absolute: 0.4 10*3/uL (ref 0.1–0.9)
Neutrophils Absolute: 4.7 10*3/uL (ref 1.4–7.0)
Neutrophils: 67 %
PLATELETS: 232 10*3/uL (ref 150–450)
RBC: 4.39 x10E6/uL (ref 3.77–5.28)
RDW: 14.4 % (ref 12.3–15.4)
RPR: NONREACTIVE
Rh Factor: POSITIVE
WBC: 7 10*3/uL (ref 3.4–10.8)

## 2018-03-30 LAB — SMN1 COPY NUMBER ANALYSIS (SMA CARRIER SCREENING)

## 2018-03-30 LAB — HEMOGLOBINOPATHY EVALUATION
HGB C: 0 %
HGB S: 0 %
HGB VARIANT: 0 %
Hemoglobin A2 Quantitation: 2.6 % (ref 1.8–3.2)
Hemoglobin F Quantitation: 0 % (ref 0.0–2.0)
Hgb A: 97.4 % (ref 96.4–98.8)

## 2018-03-30 LAB — COMPREHENSIVE METABOLIC PANEL
ALBUMIN: 4.1 g/dL (ref 3.5–5.5)
ALK PHOS: 69 IU/L (ref 39–117)
ALT: 9 IU/L (ref 0–32)
AST: 14 IU/L (ref 0–40)
Albumin/Globulin Ratio: 1.8 (ref 1.2–2.2)
BUN / CREAT RATIO: 10 (ref 9–23)
BUN: 4 mg/dL — AB (ref 6–20)
Bilirubin Total: 0.5 mg/dL (ref 0.0–1.2)
CALCIUM: 9.1 mg/dL (ref 8.7–10.2)
CO2: 19 mmol/L — AB (ref 20–29)
CREATININE: 0.42 mg/dL — AB (ref 0.57–1.00)
Chloride: 106 mmol/L (ref 96–106)
GFR, EST AFRICAN AMERICAN: 168 mL/min/{1.73_m2} (ref >59)
GFR, EST NON AFRICAN AMERICAN: 146 mL/min/{1.73_m2} (ref >59)
GLOBULIN, TOTAL: 2.3 g/dL (ref 1.5–4.5)
GLUCOSE: 88 mg/dL (ref 65–99)
Potassium: 3.7 mmol/L (ref 3.5–5.2)
Sodium: 139 mmol/L (ref 134–144)
TOTAL PROTEIN: 6.4 g/dL (ref 6.0–8.5)

## 2018-03-30 LAB — HEMOGLOBIN A1C
Est. average glucose Bld gHb Est-mCnc: 94 mg/dL
HEMOGLOBIN A1C: 4.9 % (ref 4.8–5.6)

## 2018-03-30 LAB — CYSTIC FIBROSIS MUTATION 97: GENE DIS ANAL CARRIER INTERP BLD/T-IMP: NOT DETECTED

## 2018-03-30 LAB — VITAMIN D 25 HYDROXY (VIT D DEFICIENCY, FRACTURES): Vit D, 25-Hydroxy: 26.7 ng/mL — ABNORMAL LOW (ref 30.0–100.0)

## 2018-03-30 LAB — TSH: TSH: 0.254 u[IU]/mL — ABNORMAL LOW (ref 0.450–4.500)

## 2018-04-14 ENCOUNTER — Ambulatory Visit: Payer: Medicaid Other | Admitting: Skilled Nursing Facility1

## 2018-04-19 ENCOUNTER — Encounter: Payer: Self-pay | Admitting: Obstetrics and Gynecology

## 2018-04-19 ENCOUNTER — Ambulatory Visit (INDEPENDENT_AMBULATORY_CARE_PROVIDER_SITE_OTHER): Payer: Medicaid Other | Admitting: Obstetrics and Gynecology

## 2018-04-19 VITALS — BP 97/64 | HR 80 | Wt 215.8 lb

## 2018-04-19 DIAGNOSIS — O09899 Supervision of other high risk pregnancies, unspecified trimester: Secondary | ICD-10-CM

## 2018-04-19 DIAGNOSIS — Z283 Underimmunization status: Secondary | ICD-10-CM

## 2018-04-19 DIAGNOSIS — Z3481 Encounter for supervision of other normal pregnancy, first trimester: Secondary | ICD-10-CM

## 2018-04-19 DIAGNOSIS — O99211 Obesity complicating pregnancy, first trimester: Secondary | ICD-10-CM

## 2018-04-19 DIAGNOSIS — O9989 Other specified diseases and conditions complicating pregnancy, childbirth and the puerperium: Secondary | ICD-10-CM

## 2018-04-19 DIAGNOSIS — Z3480 Encounter for supervision of other normal pregnancy, unspecified trimester: Secondary | ICD-10-CM

## 2018-04-19 DIAGNOSIS — O99210 Obesity complicating pregnancy, unspecified trimester: Secondary | ICD-10-CM

## 2018-04-19 NOTE — Progress Notes (Signed)
   PRENATAL VISIT NOTE  Subjective:  Jodi Terrell is a 22 y.o. [phone] at [redacted]w[redacted]d being seen today for ongoing prenatal care.  She is currently monitored for the following issues for this low-risk pregnancy and has Maternal morbid obesity, antepartum (HCC); Supervision of other normal pregnancy, antepartum; and Rubella non-immune status, antepartum on their problem list.  Patient reports no complaints.  Contractions: Not present. Vag. Bleeding: None.   . Denies leaking of fluid.   The following portions of the patient's history were reviewed and updated as appropriate: allergies, current medications, past family history, past medical history, past social history, past surgical history and problem list. Problem list updated.  Objective:   Vitals:   04/19/18 0945  BP: 97/64  Pulse: 80  Weight: 215 lb 12.8 oz (97.9 kg)    Fetal Status: Fetal Heart Rate (bpm): 156/doppler         General:  Alert, oriented and cooperative. Patient is in no acute distress.  Skin: Skin is warm and dry. No rash noted.   Cardiovascular: Normal heart rate noted  Respiratory: Normal respiratory effort, no problems with respiration noted  Abdomen: Soft, gravid, appropriate for gestational age.  Pain/Pressure: Absent     Pelvic: Cervical exam deferred        Extremities: Normal range of motion.  Edema: None  Mental Status: Normal mood and affect. Normal behavior. Normal judgment and thought content.   Assessment and Plan:  Pregnancy: G4P1021 at [redacted]w[redacted]d  1. Supervision of other normal pregnancy, antepartum Patient is doing well without complaint Lab results reviewed with the patient Anatomy ultrasound ordered AFP next visit - [place] MFM OB DETAIL +14 WK; Future  2. Rubella non-immune status, antepartum Will offer postpartum  3. Maternal morbid obesity, antepartum (HCC) Continue ASA - [place] MFM OB DETAIL +14 WK; Future  Preterm labor symptoms and general obstetric precautions including but not limited to  vaginal bleeding, contractions, leaking of fluid and fetal movement were reviewed in detail with the patient. Please refer to After Visit Summary for other counseling recommendations.  Return in about 4 weeks (around 05/17/2018) for ROB.  No future appointments.  Catalina Antigua, MD

## 2018-04-19 NOTE — Progress Notes (Signed)
Patient is in the office for ob visit, denies any pain.

## 2018-05-11 ENCOUNTER — Inpatient Hospital Stay (HOSPITAL_COMMUNITY)
Admission: AD | Admit: 2018-05-11 | Discharge: 2018-05-11 | Disposition: A | Discharge status: Home / Self Care | Payer: Medicaid Other | Source: Ambulatory Visit | Attending: Obstetrics & Gynecology | Admitting: Obstetrics & Gynecology

## 2018-05-11 ENCOUNTER — Other Ambulatory Visit: Payer: Self-pay

## 2018-05-11 DIAGNOSIS — Z3A16 16 weeks gestation of pregnancy: Secondary | ICD-10-CM | POA: Insufficient documentation

## 2018-05-11 DIAGNOSIS — Z87891 Personal history of nicotine dependence: Secondary | ICD-10-CM | POA: Insufficient documentation

## 2018-05-11 DIAGNOSIS — Z202 Contact with and (suspected) exposure to infections with a predominantly sexual mode of transmission: Secondary | ICD-10-CM | POA: Diagnosis not present

## 2018-05-11 DIAGNOSIS — O98312 Other infections with a predominantly sexual mode of transmission complicating pregnancy, second trimester: Secondary | ICD-10-CM | POA: Diagnosis not present

## 2018-05-11 DIAGNOSIS — Z7982 Long term (current) use of aspirin: Secondary | ICD-10-CM | POA: Insufficient documentation

## 2018-05-11 DIAGNOSIS — A568 Sexually transmitted chlamydial infection of other sites: Secondary | ICD-10-CM | POA: Diagnosis not present

## 2018-05-11 HISTORY — DX: Contact with and (suspected) exposure to infections with a predominantly sexual mode of transmission: Z20.2

## 2018-05-11 MED ORDER — AZITHROMYCIN 250 MG PO TABS
1000.0000 mg | ORAL_TABLET | Freq: Once | ORAL | Status: AC
  Administered 2018-05-11: 1000 mg via ORAL
  Filled 2018-05-11: qty 4

## 2018-05-11 NOTE — Discharge Instructions (Signed)
Chlamydia, Female Chlamydia is an STD (sexually transmitted disease). This is an infection that spreads through sexual contact. If it is not treated, it can cause serious problems. It must be treated with antibiotic medicine. Sometimes, you may not have symptoms (asymptomatic). When you have symptoms, they can include:  Burning when you pee (urinate).  Peeing often.  Fluid (discharge) coming from the vagina.  Redness, soreness, and swelling (inflammation) of the butt (rectum).  Bleeding or fluid coming from the butt.  Belly (abdominal) pain.  Pain during sex.  Bleeding between periods.  Itching, burning, or redness in the eyes.  Fluid coming from the eyes.  Follow these instructions at home: Medicines  Take over-the-counter and prescription medicines only as told by your doctor.  Take your antibiotic medicine as told by your doctor. Do not stop taking the antibiotic even if you start to feel better. Sexual activity  Tell sex partners about your infection. Sex partners are people you had oral, anal, or vaginal sex with within 60 days of when you started getting sick. They need treatment, too.  Do not have sex until: ? You and your sex partners have been treated. ? Your doctor says it is okay.  If you have a single dose treatment, wait 7 days before having sex. General instructions  It is up to you to get your test results. Ask your doctor when your results will be ready.  Get a lot of rest.  Eat healthy foods.  Drink enough fluid to keep your pee (urine) clear or pale yellow.  Keep all follow-up visits as told by your doctor. You may need tests after 3 months. Preventing chlamydia  The only way to prevent chlamydia is not to have sex. To lower your risk: ? Use latex condoms correctly. Do this every time you have sex. ? Avoid having many sex partners. ? Ask if your partner has been tested for STDs and if he or she had negative results. Contact a doctor if:  You  get new symptoms.  You do not get better with treatment.  You have a fever or chills.  You have pain during sex. Get help right away if:  Your pain gets worse and does not get better with medicine.  You get flu-like symptoms, such as: ? Night sweats. ? Sore throat. ? Muscle aches.  You feel sick to your stomach (nauseous).  You throw up (vomit).  You have trouble swallowing.  You have bleeding: ? Between periods. ? After sex.  You have irregular periods.  You have belly pain that does not get better with medicine.  You have lower back pain that does not get better with medicine.  You feel weak or dizzy.  You pass out (faint).  You are pregnant and you get symptoms of chlamydia. Summary  Chlamydia is an infection that spreads through sexual contact.  Sometimes, chlamydia can cause no symptoms (asymptomatic).  Do not have sex until your doctor says it is okay.  All sex partners will have to be treated for chlamydia. This information is not intended to replace advice given to you by your health care provider. Make sure you discuss any questions you have with your health care provider. Document Released: 05/06/2008 Document Revised: 07/17/2016 Document Reviewed: 07/17/2016 Elsevier Interactive Patient Education  2017 [employer]vier Inc.  

## 2018-05-11 NOTE — [facility] Provider Note (Signed)
History     CSN: [id number]  Arrival date and time: 05/11/18 1518   First Provider Initiated Contact with Patient 05/11/18 1539      Chief Complaint  Patient presents with  . Exposure to STD   HPI  Jodi Terrell is a 22 y.o. [id number] at [redacted]w[redacted]d who presents to [facility] for treatment after partner tested positive for Chlamydia this week. Denies pain, abnormal vaginal discharge, vaginal bleeding, leaking of fluid, decreased fetal movement, fever, falls, or recent illness.    OB History    Gravida  4   Para  1   Term  1   Preterm      AB  2   Living  1     SAB  2   TAB      Ectopic      Multiple      Live Births  1           Past Medical History:  Diagnosis Date  . Arrhythmia    with pneumonia  . Heart murmur    as a child  . Pneumonia    09/01/14- 3- 4 years ago  . Seasonal allergies     Past Surgical History:  Procedure Laterality Date  . CLOSED REDUCTION NASAL FRACTURE N/A 09/01/2014   Procedure: CLOSED REDUCTION NASAL FRACTURE;  Surgeon: Glenna Fellows, MD;  Location: [facility] OR;  Service: Plastics;  Laterality: N/A;  . TYMPANOSTOMY TUBE PLACEMENT      Family History  Problem Relation Age of Onset  . Diabetes Maternal Grandfather   . Cancer Paternal Grandmother   . Diabetes Paternal Grandfather     Social History   Tobacco Use  . Smoking status: Former Smoker    Types: Cigarettes    Last attempt to quit: 11/02/2010    Years since quitting: 7.5  . Smokeless tobacco: Never Used  Substance Use Topics  . Alcohol use: No  . Drug use: No    Allergies: No Known Allergies  Medications Prior to Admission  Medication Sig Dispense Refill Last Dose  . aspirin EC 81 MG tablet Take 1 tablet (81 mg total) by mouth daily. Take after 12 weeks for prevention of preeclampsia later in pregnancy (Patient not taking: Reported on 04/19/2018) 300 tablet 2 Not Taking  . doxylamine, Sleep, (UNISOM) 25 MG tablet Take 1 tablet (25 mg total) by mouth every 8 (eight)  hours as needed. (Patient not taking: Reported on 04/19/2018) 30 tablet 1 Not Taking  . metoCLOPramide (REGLAN) 10 MG tablet Take 1 tablet (10 mg total) by mouth 4 (four) times daily as needed for nausea or vomiting. (Patient not taking: Reported on 04/19/2018) 30 tablet 2 Not Taking  . Prenatal Vit-Fe Fumarate-FA (PRENATAL MULTIVITAMIN) TABS tablet Take 1 tablet by mouth daily at 12 noon.   Not Taking  . promethazine (PHENERGAN) 25 MG tablet Take 1 tablet (25 mg total) by mouth every 6 (six) hours as needed for nausea or vomiting. This can make drowsy (Patient not taking: Reported on 04/19/2018) 30 tablet 2 Not Taking  . pyridOXINE (VITAMIN B-6) 25 MG tablet Take 1 tablet (25 mg total) by mouth every 8 (eight) hours as needed. (Patient not taking: Reported on 04/19/2018) 30 tablet 1 Not Taking    Review of Systems  Constitutional: Negative for fever.  Genitourinary: Negative for difficulty urinating, dysuria, flank pain, vaginal bleeding, vaginal discharge and vaginal pain.  Neurological: Negative for headaches.  All other systems reviewed and are negative.  Physical Exam  Blood pressure 106/62, pulse 88, temperature 98.3 F (36.8 C), temperature source Oral, resp. rate 16, weight 99.2 kg, last menstrual period 01/07/2018, SpO2 99 %.  Physical Exam  Nursing note and vitals reviewed. Constitutional: She is oriented to person, place, and time. She appears well-developed and well-nourished.  Cardiovascular: Normal rate.  Respiratory: Effort normal.  GI: She exhibits no distension. There is no tenderness. There is no rebound.  Neurological: She is alert and oriented to person, place, and time. She has normal reflexes.  Skin: Skin is warm and dry.  Psychiatric: She has a normal mood and affect. Her behavior is normal. Judgment and thought content normal.    MAU Course  Procedures  MDM No concerning signs on physical exam Treat based on partner's confirmed positive result  Patient Vitals for  the past 24 hrs:  BP Temp Temp src Pulse Resp SpO2 Weight  05/11/18 1531 106/62 98.3 F (36.8 C) Oral 88 16 99 % 99.2 kg    Meds ordered this encounter  Medications  . azithromycin (ZITHROMAX) tablet 1,000 mg    Assessment and Plan  --S/p prophylactic treatment for Chlamydia in MAU, problem list updated --FHT 154 --Discharge home in stable condition  F/U: Next OB 05/17/18 @ [facility]  Calvert Cantor, CNM 05/11/2018, 4:11 PM

## 2018-05-11 NOTE — MAU Note (Signed)
FOB text her yesterday, that he has chlamydia. So she needs to get tested.

## 2018-05-17 ENCOUNTER — Encounter: Payer: Self-pay | Admitting: Obstetrics and Gynecology

## 2018-05-17 ENCOUNTER — Ambulatory Visit (INDEPENDENT_AMBULATORY_CARE_PROVIDER_SITE_OTHER): Payer: Medicaid Other | Admitting: Obstetrics and Gynecology

## 2018-05-17 VITALS — BP 105/69 | HR 87 | Wt 215.2 lb

## 2018-05-17 DIAGNOSIS — O99210 Obesity complicating pregnancy, unspecified trimester: Principal | ICD-10-CM

## 2018-05-17 DIAGNOSIS — Z202 Contact with and (suspected) exposure to infections with a predominantly sexual mode of transmission: Secondary | ICD-10-CM

## 2018-05-17 DIAGNOSIS — Z23 Encounter for immunization: Secondary | ICD-10-CM

## 2018-05-17 DIAGNOSIS — Z3480 Encounter for supervision of other normal pregnancy, unspecified trimester: Secondary | ICD-10-CM

## 2018-05-17 DIAGNOSIS — Z283 Underimmunization status: Secondary | ICD-10-CM

## 2018-05-17 DIAGNOSIS — O99212 Obesity complicating pregnancy, second trimester: Secondary | ICD-10-CM

## 2018-05-17 DIAGNOSIS — O09899 Supervision of other high risk pregnancies, unspecified trimester: Secondary | ICD-10-CM

## 2018-05-17 DIAGNOSIS — O9989 Other specified diseases and conditions complicating pregnancy, childbirth and the puerperium: Secondary | ICD-10-CM

## 2018-05-17 DIAGNOSIS — Z3482 Encounter for supervision of other normal pregnancy, second trimester: Secondary | ICD-10-CM

## 2018-05-17 NOTE — Progress Notes (Signed)
   PRENATAL VISIT NOTE  Subjective:  Jodi Terrell is a 22 y.o. [phone] at [redacted]w[redacted]d being seen today for ongoing prenatal care.  She is currently monitored for the following issues for this high-risk pregnancy and has Maternal morbid obesity, antepartum (HCC); Supervision of other normal pregnancy, antepartum; Rubella non-immune status, antepartum; and Exposure to chlamydia on their problem list.  Patient reports no complaints.  Contractions: Not present. Vag. Bleeding: None.  Movement: Present. Denies leaking of fluid.   The following portions of the patient's history were reviewed and updated as appropriate: allergies, current medications, past family history, past medical history, past social history, past surgical history and problem list. Problem list updated.  Objective:   Vitals:   05/17/18 0956  BP: 105/69  Pulse: 87  Weight: 215 lb 3.2 oz (97.6 kg)    Fetal Status: Fetal Heart Rate (bpm): 157   Movement: Present     General:  Alert, oriented and cooperative. Patient is in no acute distress.  Skin: Skin is warm and dry. No rash noted.   Cardiovascular: Normal heart rate noted  Respiratory: Normal respiratory effort, no problems with respiration noted  Abdomen: Soft, gravid, appropriate for gestational age.  Pain/Pressure: Absent     Pelvic: Cervical exam deferred        Extremities: Normal range of motion.  Edema: None  Mental Status: Normal mood and affect. Normal behavior. Normal judgment and thought content.   Assessment and Plan:  Pregnancy: G4P1021 at [redacted]w[redacted]d  1. Supervision of other normal pregnancy, antepartum Patient is doing well without complaints Anatomy ultrasound on 10/16 Flu vaccine today  2. Maternal morbid obesity, antepartum (HCC)   3. Exposure to chlamydia Patient treated on 10/1 as partner tested positive for chlamydia  4. Rubella non-immune status, antepartum Will offer pp  Preterm labor symptoms and general obstetric precautions including but  not limited to vaginal bleeding, contractions, leaking of fluid and fetal movement were reviewed in detail with the patient. Please refer to After Visit Summary for other counseling recommendations.  Return in about 4 weeks (around 06/14/2018) for ROB.  Future Appointments  Date Time Provider Department Center  05/26/2018 11:00 AM [facility] [place] 3 [facility]    Catalina Antigua, MD

## 2018-05-17 NOTE — Addendum Note (Signed)
Addended by: Tim Lair on: 05/17/2018 10:20 AM   Modules accepted: Orders

## 2018-05-17 NOTE — Progress Notes (Signed)
MAU visit on 05/11/18 CT treatment since partner tested positive for CT.

## 2018-05-19 ENCOUNTER — Encounter (HOSPITAL_COMMUNITY): Payer: Self-pay

## 2018-05-26 ENCOUNTER — Encounter (HOSPITAL_COMMUNITY): Payer: Self-pay

## 2018-05-26 ENCOUNTER — Ambulatory Visit (HOSPITAL_COMMUNITY)
Admission: RE | Admit: 2018-05-26 | Discharge: 2018-05-26 | Disposition: A | Discharge status: Home / Self Care | Payer: Medicaid Other | Source: Ambulatory Visit | Attending: Obstetrics and Gynecology | Admitting: Obstetrics and Gynecology

## 2018-05-26 DIAGNOSIS — Z3480 Encounter for supervision of other normal pregnancy, unspecified trimester: Secondary | ICD-10-CM

## 2018-05-26 DIAGNOSIS — O99212 Obesity complicating pregnancy, second trimester: Secondary | ICD-10-CM

## 2018-05-26 DIAGNOSIS — Z3A19 19 weeks gestation of pregnancy: Secondary | ICD-10-CM | POA: Insufficient documentation

## 2018-05-26 DIAGNOSIS — Z363 Encounter for antenatal screening for malformations: Secondary | ICD-10-CM | POA: Diagnosis not present

## 2018-05-26 DIAGNOSIS — O99210 Obesity complicating pregnancy, unspecified trimester: Secondary | ICD-10-CM

## 2018-05-26 DIAGNOSIS — Z3482 Encounter for supervision of other normal pregnancy, second trimester: Secondary | ICD-10-CM | POA: Insufficient documentation

## 2018-05-27 ENCOUNTER — Other Ambulatory Visit (HOSPITAL_COMMUNITY): Payer: Self-pay | Admitting: *Deleted

## 2018-05-27 DIAGNOSIS — O99212 Obesity complicating pregnancy, second trimester: Secondary | ICD-10-CM

## 2018-06-14 ENCOUNTER — Encounter: Payer: Self-pay | Admitting: Obstetrics and Gynecology

## 2018-06-14 ENCOUNTER — Ambulatory Visit (INDEPENDENT_AMBULATORY_CARE_PROVIDER_SITE_OTHER): Payer: Medicaid Other | Admitting: Obstetrics and Gynecology

## 2018-06-14 ENCOUNTER — Other Ambulatory Visit (HOSPITAL_COMMUNITY)
Admission: RE | Admit: 2018-06-14 | Discharge: 2018-06-14 | Disposition: A | Discharge status: Home / Self Care | Payer: Medicaid Other | Source: Ambulatory Visit | Attending: Obstetrics and Gynecology | Admitting: Obstetrics and Gynecology

## 2018-06-14 VITALS — BP 117/75 | HR 88 | Wt 213.9 lb

## 2018-06-14 DIAGNOSIS — O9989 Other specified diseases and conditions complicating pregnancy, childbirth and the puerperium: Secondary | ICD-10-CM

## 2018-06-14 DIAGNOSIS — Z202 Contact with and (suspected) exposure to infections with a predominantly sexual mode of transmission: Secondary | ICD-10-CM | POA: Diagnosis not present

## 2018-06-14 DIAGNOSIS — O99210 Obesity complicating pregnancy, unspecified trimester: Secondary | ICD-10-CM

## 2018-06-14 DIAGNOSIS — Z283 Underimmunization status: Secondary | ICD-10-CM

## 2018-06-14 DIAGNOSIS — O09899 Supervision of other high risk pregnancies, unspecified trimester: Secondary | ICD-10-CM

## 2018-06-14 DIAGNOSIS — Z3480 Encounter for supervision of other normal pregnancy, unspecified trimester: Secondary | ICD-10-CM | POA: Diagnosis not present

## 2018-06-14 NOTE — Progress Notes (Signed)
   PRENATAL VISIT NOTE  Subjective:  Jodi Terrell is a 22 y.o. [phone] at [redacted]w[redacted]d being seen today for ongoing prenatal care.  She is currently monitored for the following issues for this high-risk pregnancy and has Maternal morbid obesity, antepartum (HCC); Supervision of other normal pregnancy, antepartum; Rubella non-immune status, antepartum; and Exposure to chlamydia on their problem list.  Patient reports dental pain.  Contractions: Not present. Vag. Bleeding: None.  Movement: Present. Denies leaking of fluid.   The following portions of the patient's history were reviewed and updated as appropriate: allergies, current medications, past family history, past medical history, past social history, past surgical history and problem list. Problem list updated.  Objective:   Vitals:   06/14/18 1006  BP: 117/75  Pulse: 88  Weight: 213 lb 14.4 oz (97 kg)    Fetal Status: Fetal Heart Rate (bpm): 156 Fundal Height: 23 cm Movement: Present     General:  Alert, oriented and cooperative. Patient is in no acute distress.  Skin: Skin is warm and dry. No rash noted.   Cardiovascular: Normal heart rate noted  Respiratory: Normal respiratory effort, no problems with respiration noted  Abdomen: Soft, gravid, appropriate for gestational age.  Pain/Pressure: Absent     Pelvic: Cervical exam deferred        Extremities: Normal range of motion.  Edema: None  Mental Status: Normal mood and affect. Normal behavior. Normal judgment and thought content.   Assessment and Plan:  Pregnancy: [id number] at [redacted]w[redacted]d  1. Supervision of other normal pregnancy, antepartum Patient is doing well without complaints Follow up ultrasound on 11/13 AFP today  2. Exposure to chlamydia Test of cure today  3. Rubella non-immune status, antepartum Will offer postpartum  4. Maternal morbid obesity, antepartum (HCC)   Preterm labor symptoms and general obstetric precautions including but not limited to vaginal  bleeding, contractions, leaking of fluid and fetal movement were reviewed in detail with the patient. Please refer to After Visit Summary for other counseling recommendations.  Return in about 4 weeks (around 07/12/2018) for ROB.  Future Appointments  Date Time Provider Department Center  06/23/2018 11:00 AM [facility] [place] 3 [facility]    Catalina Antigua, MD

## 2018-06-14 NOTE — Progress Notes (Signed)
Pt needs dental referral c/o R side wisdom tooth pain x 2 wks.

## 2018-06-15 LAB — GC/CHLAMYDIA PROBE AMP (~~LOC~~) NOT AT ARMC
Chlamydia: NEGATIVE
Neisseria Gonorrhea: NEGATIVE

## 2018-06-16 LAB — AFP, SERUM, OPEN SPINA BIFIDA
AFP MoM: 1.09
AFP Value: 60.9 ng/mL
GEST. AGE ON COLLECTION DATE: 21.5 wk
Maternal Age At EDD: 23.2 yr
OSBR Risk 1 IN: 9231
TEST RESULTS AFP: NEGATIVE
Weight: 213 [lb_av]

## 2018-06-23 ENCOUNTER — Encounter (HOSPITAL_COMMUNITY): Payer: Self-pay

## 2018-06-23 ENCOUNTER — Other Ambulatory Visit (HOSPITAL_COMMUNITY): Payer: Self-pay | Admitting: *Deleted

## 2018-06-23 ENCOUNTER — Ambulatory Visit (HOSPITAL_COMMUNITY)
Admission: RE | Admit: 2018-06-23 | Discharge: 2018-06-23 | Disposition: A | Discharge status: Home / Self Care | Payer: Medicaid Other | Source: Ambulatory Visit | Attending: Obstetrics and Gynecology | Admitting: Obstetrics and Gynecology

## 2018-06-23 DIAGNOSIS — Z6841 Body Mass Index (BMI) 40.0 and over, adult: Principal | ICD-10-CM

## 2018-06-23 DIAGNOSIS — Z362 Encounter for other antenatal screening follow-up: Secondary | ICD-10-CM

## 2018-06-23 DIAGNOSIS — Z3A23 23 weeks gestation of pregnancy: Secondary | ICD-10-CM | POA: Insufficient documentation

## 2018-06-23 DIAGNOSIS — O99212 Obesity complicating pregnancy, second trimester: Secondary | ICD-10-CM | POA: Insufficient documentation

## 2018-07-12 ENCOUNTER — Ambulatory Visit (INDEPENDENT_AMBULATORY_CARE_PROVIDER_SITE_OTHER): Payer: Medicaid Other | Admitting: Obstetrics and Gynecology

## 2018-07-12 ENCOUNTER — Encounter: Payer: Self-pay | Admitting: Obstetrics and Gynecology

## 2018-07-12 VITALS — BP 130/82 | HR 79 | Wt 226.6 lb

## 2018-07-12 DIAGNOSIS — Z283 Underimmunization status: Secondary | ICD-10-CM

## 2018-07-12 DIAGNOSIS — Z3482 Encounter for supervision of other normal pregnancy, second trimester: Secondary | ICD-10-CM

## 2018-07-12 DIAGNOSIS — Z3480 Encounter for supervision of other normal pregnancy, unspecified trimester: Secondary | ICD-10-CM

## 2018-07-12 DIAGNOSIS — O9989 Other specified diseases and conditions complicating pregnancy, childbirth and the puerperium: Secondary | ICD-10-CM

## 2018-07-12 DIAGNOSIS — O99210 Obesity complicating pregnancy, unspecified trimester: Secondary | ICD-10-CM

## 2018-07-12 DIAGNOSIS — Z2839 Other underimmunization status: Secondary | ICD-10-CM

## 2018-07-12 DIAGNOSIS — O99212 Obesity complicating pregnancy, second trimester: Secondary | ICD-10-CM

## 2018-07-12 NOTE — Progress Notes (Signed)
   PRENATAL VISIT NOTE  Subjective:  Jodi Terrell is a 22 y.o. [phone] at 3479w5d being seen today for ongoing prenatal care.  She is currently monitored for the following issues for this high-risk pregnancy and has Maternal morbid obesity, antepartum (HCC); Supervision of other normal pregnancy, antepartum; Rubella non-immune status, antepartum; and Exposure to chlamydia on their problem list.  Patient reports no complaints.  Contractions: Irritability. Vag. Bleeding: None.  Movement: Present. Denies leaking of fluid.   The following portions of the patient's history were reviewed and updated as appropriate: allergies, current medications, past family history, past medical history, past social history, past surgical history and problem list. Problem list updated.  Objective:   Vitals:   07/12/18 1106  BP: 130/82  Pulse: 79  Weight: 226 lb 9.6 oz (102.8 kg)    Fetal Status: Fetal Heart Rate (bpm): 148   Movement: Present     General:  Alert, oriented and cooperative. Patient is in no acute distress.  Skin: Skin is warm and dry. No rash noted.   Cardiovascular: Normal heart rate noted  Respiratory: Normal respiratory effort, no problems with respiration noted  Abdomen: Soft, gravid, appropriate for gestational age.  Pain/Pressure: Present     Pelvic: Cervical exam deferred        Extremities: Normal range of motion.  Edema: None  Mental Status: Normal mood and affect. Normal behavior. Normal judgment and thought content.   Assessment and Plan:  Pregnancy: G4P1021 at 5979w5d  1. Supervision of other normal pregnancy, antepartum Patient is doing well without complaints Third trimester labs and glucola next visit  2. Rubella non-immune status, antepartum Will offer pp  3. Maternal morbid obesity, antepartum (HCC) Follow up Growth ultrasound scheduled BPP starting at 36 weeks weekly  Preterm labor symptoms and general obstetric precautions including but not limited to vaginal  bleeding, contractions, leaking of fluid and fetal movement were reviewed in detail with the patient. Please refer to After Visit Summary for other counseling recommendations.  Return in about 4 weeks (around 08/09/2018) for ROB, 2 hr glucola next visit.  Future Appointments  Date Time Provider Department Center  07/21/2018 10:30 AM [facility] US 1 [facility]    Catalina AntiguaPeggy Esteban Kobashigawa, MD

## 2018-07-12 NOTE — Progress Notes (Signed)
Pt is here for ROB. [id number] 1526w5d.

## 2018-07-21 ENCOUNTER — Ambulatory Visit (HOSPITAL_COMMUNITY)
Admission: RE | Admit: 2018-07-21 | Discharge: 2018-07-21 | Disposition: A | Discharge status: Home / Self Care | Payer: Medicaid Other | Source: Ambulatory Visit | Attending: Obstetrics and Gynecology | Admitting: Obstetrics and Gynecology

## 2018-07-21 ENCOUNTER — Encounter (HOSPITAL_COMMUNITY): Payer: Self-pay

## 2018-07-21 ENCOUNTER — Other Ambulatory Visit (HOSPITAL_COMMUNITY): Payer: Self-pay | Admitting: *Deleted

## 2018-07-21 DIAGNOSIS — O99212 Obesity complicating pregnancy, second trimester: Secondary | ICD-10-CM | POA: Diagnosis not present

## 2018-07-21 DIAGNOSIS — Z6841 Body Mass Index (BMI) 40.0 and over, adult: Secondary | ICD-10-CM

## 2018-07-21 DIAGNOSIS — Z362 Encounter for other antenatal screening follow-up: Secondary | ICD-10-CM | POA: Diagnosis not present

## 2018-07-21 DIAGNOSIS — O99213 Obesity complicating pregnancy, third trimester: Secondary | ICD-10-CM

## 2018-07-21 DIAGNOSIS — Z3A27 27 weeks gestation of pregnancy: Secondary | ICD-10-CM | POA: Insufficient documentation

## 2018-08-10 ENCOUNTER — Other Ambulatory Visit: Payer: Medicaid Other

## 2018-08-10 ENCOUNTER — Encounter: Payer: Self-pay | Admitting: Obstetrics and Gynecology

## 2018-08-10 ENCOUNTER — Ambulatory Visit (INDEPENDENT_AMBULATORY_CARE_PROVIDER_SITE_OTHER): Payer: Medicaid Other | Admitting: Obstetrics and Gynecology

## 2018-08-10 VITALS — BP 115/71 | HR 88 | Wt 225.0 lb

## 2018-08-10 DIAGNOSIS — Z3483 Encounter for supervision of other normal pregnancy, third trimester: Secondary | ICD-10-CM

## 2018-08-10 DIAGNOSIS — O09899 Supervision of other high risk pregnancies, unspecified trimester: Secondary | ICD-10-CM

## 2018-08-10 DIAGNOSIS — Z3480 Encounter for supervision of other normal pregnancy, unspecified trimester: Secondary | ICD-10-CM | POA: Diagnosis not present

## 2018-08-10 DIAGNOSIS — O99210 Obesity complicating pregnancy, unspecified trimester: Secondary | ICD-10-CM

## 2018-08-10 DIAGNOSIS — Z283 Underimmunization status: Secondary | ICD-10-CM

## 2018-08-10 DIAGNOSIS — O9989 Other specified diseases and conditions complicating pregnancy, childbirth and the puerperium: Secondary | ICD-10-CM

## 2018-08-10 DIAGNOSIS — O99213 Obesity complicating pregnancy, third trimester: Secondary | ICD-10-CM

## 2018-08-10 NOTE — Progress Notes (Signed)
   PRENATAL VISIT NOTE  Subjective:  Jodi Terrell is a 22 y.o. [phone] at 2072w6d being seen today for ongoing prenatal care.  She is currently monitored for the following issues for this low-risk pregnancy and has Maternal morbid obesity, antepartum (HCC); Supervision of other normal pregnancy, antepartum; Rubella non-immune status, antepartum; and Exposure to chlamydia on their problem list.  Patient reports no complaints.  Contractions: Not present. Vag. Bleeding: None.  Movement: Present. Denies leaking of fluid.   The following portions of the patient's history were reviewed and updated as appropriate: allergies, current medications, past family history, past medical history, past social history, past surgical history and problem list. Problem list updated.  Objective:   Vitals:   08/10/18 0900  BP: 115/71  Pulse: 88  Weight: 225 lb (102.1 kg)    Fetal Status: Fetal Heart Rate (bpm): 154 Fundal Height: 32 cm Movement: Present     General:  Alert, oriented and cooperative. Patient is in no acute distress.  Skin: Skin is warm and dry. No rash noted.   Cardiovascular: Normal heart rate noted  Respiratory: Normal respiratory effort, no problems with respiration noted  Abdomen: Soft, gravid, appropriate for gestational age.  Pain/Pressure: Present     Pelvic: Cervical exam deferred        Extremities: Normal range of motion.  Edema: Trace  Mental Status: Normal mood and affect. Normal behavior. Normal judgment and thought content.   Assessment and Plan:  Pregnancy: G4P1021 at 7572w6d  1. Supervision of other normal pregnancy, antepartum Patient is doing well  Third trimester labs today Patient plans nexplanon for contraception - Glucose Tolerance, 2 Hours w/1 Hour - CBC - RPR - HIV Antibody (routine testing w rflx)  2. Rubella non-immune status, antepartum Will offer pp  3. Maternal morbid obesity, antepartum (HCC) Followup growth ultrasound today  Preterm labor  symptoms and general obstetric precautions including but not limited to vaginal bleeding, contractions, leaking of fluid and fetal movement were reviewed in detail with the patient. Please refer to After Visit Summary for other counseling recommendations.  Return in about 2 weeks (around 08/24/2018) for ROB.  Future Appointments  Date Time Provider Department Center  08/18/2018 11:00 AM [facility] US 3 [facility]    Catalina AntiguaPeggy Reida Hem, MD

## 2018-08-10 NOTE — Progress Notes (Signed)
Tdap declined  

## 2018-08-11 ENCOUNTER — Inpatient Hospital Stay (HOSPITAL_COMMUNITY)
Admission: AD | Admit: 2018-08-11 | Discharge: 2018-08-11 | Disposition: A | Discharge status: Home / Self Care | Payer: Medicaid Other | Attending: Obstetrics and Gynecology | Admitting: Obstetrics and Gynecology

## 2018-08-11 ENCOUNTER — Other Ambulatory Visit: Payer: Self-pay

## 2018-08-11 DIAGNOSIS — Z87891 Personal history of nicotine dependence: Secondary | ICD-10-CM | POA: Insufficient documentation

## 2018-08-11 DIAGNOSIS — R0989 Other specified symptoms and signs involving the circulatory and respiratory systems: Secondary | ICD-10-CM

## 2018-08-11 DIAGNOSIS — Z3A30 30 weeks gestation of pregnancy: Secondary | ICD-10-CM | POA: Insufficient documentation

## 2018-08-11 DIAGNOSIS — O99513 Diseases of the respiratory system complicating pregnancy, third trimester: Secondary | ICD-10-CM | POA: Diagnosis not present

## 2018-08-11 DIAGNOSIS — O26893 Other specified pregnancy related conditions, third trimester: Secondary | ICD-10-CM

## 2018-08-11 DIAGNOSIS — R05 Cough: Secondary | ICD-10-CM | POA: Diagnosis present

## 2018-08-11 LAB — URINALYSIS, ROUTINE W REFLEX MICROSCOPIC
BILIRUBIN URINE: NEGATIVE
Glucose, UA: NEGATIVE mg/dL
Hgb urine dipstick: NEGATIVE
Ketones, ur: 5 mg/dL — AB
Leukocytes, UA: NEGATIVE
Nitrite: NEGATIVE
Protein, ur: NEGATIVE mg/dL
Specific Gravity, Urine: 1.02 (ref 1.005–1.030)
pH: 8 (ref 5.0–8.0)

## 2018-08-11 LAB — INFLUENZA PANEL BY PCR (TYPE A & B)
Influenza A By PCR: NEGATIVE
Influenza B By PCR: NEGATIVE

## 2018-08-11 LAB — CBC
Hematocrit: 33.9 % — ABNORMAL LOW (ref 34.0–46.6)
Hemoglobin: 11.4 g/dL (ref 11.1–15.9)
MCH: 29.2 pg (ref 26.6–33.0)
MCHC: 33.6 g/dL (ref 31.5–35.7)
MCV: 87 fL (ref 79–97)
PLATELETS: 188 10*3/uL (ref 150–450)
RBC: 3.9 x10E6/uL (ref 3.77–5.28)
RDW: 13.1 % (ref 12.3–15.4)
WBC: 8.9 10*3/uL (ref 3.4–10.8)

## 2018-08-11 LAB — GLUCOSE TOLERANCE, 2 HOURS W/ 1HR
GLUCOSE, FASTING: 75 mg/dL (ref 65–91)
Glucose, 1 hour: 94 mg/dL (ref 65–179)
Glucose, 2 hour: 70 mg/dL (ref 65–152)

## 2018-08-11 LAB — HIV ANTIBODY (ROUTINE TESTING W REFLEX): HIV Screen 4th Generation wRfx: NONREACTIVE

## 2018-08-11 LAB — RPR: RPR Ser Ql: NONREACTIVE

## 2018-08-11 NOTE — Discharge Instructions (Signed)

## 2018-08-11 NOTE — L&D Delivery Note (Addendum)
Delivery Note At 3:56 AM a viable child was delivered via Vaginal, Spontaneous (Presentation:Cephalic; OA ).  APGAR: 7, 9; weight 3841g.   Placenta status Spontaneous, Complete.  Cord: 3 Vessel  With no complication.  Cord pH: NA  Second stage of labour was met with slow fetal decent into pelvic outlet and some episodes of Fetal bradycardia. Dr Rip Harbour called for possible need for vacuum or forceps extraction, but FHR improved with the discontinuation of Pitocin infusion and scalp stimulation. Eventually due to fetal head rotation there was head decent and infant was delivered over intact perineum. Uterus was hemostatic and firm after 3rd stage and administration of IV pitocin PP.  Anesthesia:  Epidural  Episiotomy: None Lacerations: None Suture Repair: No repair Est. Blood Loss (mL):  160m  Mom to postpartum.  Baby to Couplet care / Skin to Skin.  AJustus MemoryMD PGY1 [facility]  10/21/2018, 5:18 AM  Attestation: I have seen this patient and agree with the resident's documentation. I was present, gowned and gloved throughout and assisted with the delivery.   LLambert Mody WJuleen China DO OB/GYN Fellow

## 2018-08-11 NOTE — MAU Provider Note (Signed)
History     CSN: [id number]  Arrival date and time: 08/11/18 1937   None     Chief Complaint  Patient presents with  . Influenza   [id number] @30  wks presenting with cough, congestion, sore throat and HA. Sx started this am. Body aches present. No fevers but can't get warm. Daughter has been sick with same sx. Had flu shot. Works in nursing home. Has not taken and OTCs. Feeling good FM. No pregnancy c/o.   OB History    Gravida  4   Para  1   Term  1   Preterm      AB  2   Living  1     SAB  2   TAB      Ectopic      Multiple      Live Births  1           Past Medical History:  Diagnosis Date  . Arrhythmia    with pneumonia  . Heart murmur    as a child  . Pneumonia    09/01/14- 3- 4 years ago  . Seasonal allergies     Past Surgical History:  Procedure Laterality Date  . CLOSED REDUCTION NASAL FRACTURE N/A 09/01/2014   Procedure: CLOSED REDUCTION NASAL FRACTURE;  Surgeon: Glenna FellowsBrinda Thimmappa, MD;  Location: [facility] OR;  Service: Plastics;  Laterality: N/A;  . TYMPANOSTOMY TUBE PLACEMENT      Family History  Problem Relation Age of Onset  . Diabetes Maternal Grandfather   . Cancer Paternal Grandmother   . Diabetes Paternal Grandfather     Social History   Tobacco Use  . Smoking status: Former Smoker    Types: Cigarettes    Last attempt to quit: 11/02/2010    Years since quitting: 7.7  . Smokeless tobacco: Never Used  Substance Use Topics  . Alcohol use: No  . Drug use: No    Allergies: No Known Allergies  No medications prior to admission.    Review of Systems  Constitutional: Positive for chills. Negative for fever.  HENT: Positive for congestion and sore throat. Negative for ear pain and sinus pain.   Respiratory: Positive for cough. Negative for shortness of breath.   Cardiovascular: Negative for chest pain.   Physical Exam   Blood pressure (!) 86/51, pulse (!) 114, temperature (!) 97.4 F (36.3 C), temperature source Oral, resp.  rate 18, weight 101.6 kg, last menstrual period 01/07/2018.  Physical Exam  Constitutional: She is oriented to person, place, and time. She appears well-developed and well-nourished. No distress.  HENT:  Head: Normocephalic and atraumatic.  Right Ear: Hearing, tympanic membrane, external ear and ear canal normal.  Left Ear: Hearing, tympanic membrane, external ear and ear canal normal.  Nose: Nose normal. Right sinus exhibits no maxillary sinus tenderness and no frontal sinus tenderness. Left sinus exhibits no maxillary sinus tenderness and no frontal sinus tenderness.  Mouth/Throat: Uvula is midline, oropharynx is clear and moist and mucous membranes are normal.  Cardiovascular: Regular rhythm and normal heart sounds.  tachy  Respiratory: Effort normal. No respiratory distress. She has no wheezes. She has no rales.  Musculoskeletal: Normal range of motion.  Neurological: She is alert and oriented to person, place, and time.  Skin: Skin is warm and dry.  Psychiatric: She has a normal mood and affect.  FHT 160  Results for orders placed or performed during the hospital encounter of 08/11/18 (from the past 24 hour(s))  Urinalysis, Routine w  reflex microscopic     Status: Abnormal   Collection Time: 08/11/18  7:37 PM  Result Value Ref Range   Color, Urine YELLOW YELLOW   APPearance HAZY (A) CLEAR   Specific Gravity, Urine 1.020 1.005 - 1.030   pH 8.0 5.0 - 8.0   Glucose, UA NEGATIVE NEGATIVE mg/dL   Hgb urine dipstick NEGATIVE NEGATIVE   Bilirubin Urine NEGATIVE NEGATIVE   Ketones, ur 5 (A) NEGATIVE mg/dL   Protein, ur NEGATIVE NEGATIVE mg/dL   Nitrite NEGATIVE NEGATIVE   Leukocytes, UA NEGATIVE NEGATIVE   RBC / HPF 0-5 0 - 5 RBC/hpf   WBC, UA 0-5 0 - 5 WBC/hpf   Bacteria, UA RARE (A) NONE SEEN   Squamous Epithelial / LPF 0-5 0 - 5   Mucus PRESENT   Influenza panel by PCR (type A & B)     Status: None   Collection Time: 08/11/18  8:15 PM  Result Value Ref Range   Influenza A By  PCR NEGATIVE NEGATIVE   Influenza B By PCR NEGATIVE NEGATIVE   MAU Course  Procedures  MDM Labs ordered and reviewed. No evidence of flu or pneumonia. Discussed treatment of common cold. Stable for discharge home.    Assessment and Plan   1. Upper respiratory symptom   2. [redacted] weeks gestation of pregnancy    Discharge home  Follow up in OB office as scheduled Safe med in pregnancy sheet given  Allergies as of 08/11/2018   No Known Allergies     Medication List    TAKE these medications   aspirin EC 81 MG tablet Take 1 tablet (81 mg total) by mouth daily. Take after 12 weeks for prevention of preeclampsia later in pregnancy   doxylamine (Sleep) 25 MG tablet Commonly known as:  UNISOM Take 1 tablet (25 mg total) by mouth every 8 (eight) hours as needed.   metoCLOPramide 10 MG tablet Commonly known as:  REGLAN Take 1 tablet (10 mg total) by mouth 4 (four) times daily as needed for nausea or vomiting.   prenatal multivitamin Tabs tablet Take 1 tablet by mouth daily at 12 noon.   promethazine 25 MG tablet Commonly known as:  PHENERGAN Take 1 tablet (25 mg total) by mouth every 6 (six) hours as needed for nausea or vomiting. This can make drowsy   pyridOXINE 25 MG tablet Commonly known as:  VITAMIN B-6 Take 1 tablet (25 mg total) by mouth every 8 (eight) hours as needed.      Donette LarryMelanie Jakki Doughty, CNM 08/12/2018, 1:25 AM

## 2018-08-11 NOTE — MAU Note (Signed)
Pt presents to mau c/o possible flu. Pt reports cough HA and sore throat as well as vomiting x2days. No vaginal bleeding or abdominal pain. +fm.

## 2018-08-11 NOTE — MAU Note (Signed)
Pt not in lobby x1 

## 2018-08-18 ENCOUNTER — Encounter (HOSPITAL_COMMUNITY): Payer: Self-pay

## 2018-08-18 ENCOUNTER — Ambulatory Visit (HOSPITAL_COMMUNITY)
Admission: RE | Admit: 2018-08-18 | Discharge: 2018-08-18 | Disposition: A | Discharge status: Home / Self Care | Payer: Medicaid Other | Source: Ambulatory Visit | Attending: Obstetrics and Gynecology | Admitting: Obstetrics and Gynecology

## 2018-08-18 ENCOUNTER — Other Ambulatory Visit (HOSPITAL_COMMUNITY): Payer: Self-pay | Admitting: *Deleted

## 2018-08-18 DIAGNOSIS — O9989 Other specified diseases and conditions complicating pregnancy, childbirth and the puerperium: Secondary | ICD-10-CM | POA: Insufficient documentation

## 2018-08-18 DIAGNOSIS — Z283 Underimmunization status: Secondary | ICD-10-CM | POA: Diagnosis not present

## 2018-08-18 DIAGNOSIS — Z3A31 31 weeks gestation of pregnancy: Secondary | ICD-10-CM | POA: Diagnosis not present

## 2018-08-18 DIAGNOSIS — Z3480 Encounter for supervision of other normal pregnancy, unspecified trimester: Secondary | ICD-10-CM | POA: Insufficient documentation

## 2018-08-18 DIAGNOSIS — O09899 Supervision of other high risk pregnancies, unspecified trimester: Secondary | ICD-10-CM

## 2018-08-18 DIAGNOSIS — O99213 Obesity complicating pregnancy, third trimester: Secondary | ICD-10-CM

## 2018-08-24 ENCOUNTER — Ambulatory Visit (INDEPENDENT_AMBULATORY_CARE_PROVIDER_SITE_OTHER): Payer: Medicaid Other | Admitting: Obstetrics and Gynecology

## 2018-08-24 ENCOUNTER — Encounter: Payer: Self-pay | Admitting: Obstetrics and Gynecology

## 2018-08-24 VITALS — BP 98/63 | HR 87 | Wt 227.0 lb

## 2018-08-24 DIAGNOSIS — Z2839 Other underimmunization status: Secondary | ICD-10-CM

## 2018-08-24 DIAGNOSIS — Z3A31 31 weeks gestation of pregnancy: Secondary | ICD-10-CM

## 2018-08-24 DIAGNOSIS — O9989 Other specified diseases and conditions complicating pregnancy, childbirth and the puerperium: Secondary | ICD-10-CM

## 2018-08-24 DIAGNOSIS — Z3480 Encounter for supervision of other normal pregnancy, unspecified trimester: Secondary | ICD-10-CM

## 2018-08-24 DIAGNOSIS — Z3483 Encounter for supervision of other normal pregnancy, third trimester: Secondary | ICD-10-CM

## 2018-08-24 DIAGNOSIS — Z283 Underimmunization status: Secondary | ICD-10-CM

## 2018-08-24 DIAGNOSIS — O99210 Obesity complicating pregnancy, unspecified trimester: Secondary | ICD-10-CM

## 2018-08-24 DIAGNOSIS — O99213 Obesity complicating pregnancy, third trimester: Secondary | ICD-10-CM

## 2018-08-24 NOTE — Progress Notes (Signed)
   PRENATAL VISIT NOTE  Subjective:  Jodi Terrell is a 23 y.o. [phone] at [redacted]w[redacted]d being seen today for ongoing prenatal care.  She is currently monitored for the following issues for this high-risk pregnancy and has Maternal morbid obesity, antepartum (HCC); Supervision of other normal pregnancy, antepartum; Rubella non-immune status, antepartum; and Exposure to chlamydia on their problem list.  Patient reports no complaints.  Contractions: Not present. Vag. Bleeding: None.  Movement: Present. Denies leaking of fluid.   The following portions of the patient's history were reviewed and updated as appropriate: allergies, current medications, past family history, past medical history, past social history, past surgical history and problem list. Problem list updated.  Objective:   Vitals:   08/24/18 1047  BP: 98/63  Pulse: 87  Weight: 227 lb (103 kg)    Fetal Status: Fetal Heart Rate (bpm): 150 Fundal Height: 34 cm Movement: Present     General:  Alert, oriented and cooperative. Patient is in no acute distress.  Skin: Skin is warm and dry. No rash noted.   Cardiovascular: Normal heart rate noted  Respiratory: Normal respiratory effort, no problems with respiration noted  Abdomen: Soft, gravid, appropriate for gestational age.  Pain/Pressure: Absent     Pelvic: Cervical exam deferred        Extremities: Normal range of motion.  Edema: None  Mental Status: Normal mood and affect. Normal behavior. Normal judgment and thought content.   Assessment and Plan:  Pregnancy: G4P1021 at [redacted]w[redacted]d  1. Supervision of other normal pregnancy, antepartum Patient is doing well Third trimester labs reviewed  2. Rubella non-immune status, antepartum Will offer pp  3. Maternal morbid obesity, antepartum (HCC) Follow up growth ultrasound scheduled  Preterm labor symptoms and general obstetric precautions including but not limited to vaginal bleeding, contractions, leaking of fluid and fetal movement  were reviewed in detail with the patient. Please refer to After Visit Summary for other counseling recommendations.  Return in about 2 weeks (around 09/07/2018) for ROB.  Future Appointments  Date Time Provider Department Center  08/24/2018 11:15 AM Vangie Henthorn, Gigi Gin, MD [facility] None  09/08/2018 10:30 AM Gerrit Heck, CNM [facility] None  09/15/2018  1:30 PM [facility] [place] 1 [facility]    Catalina Antigua, MD

## 2018-09-08 ENCOUNTER — Ambulatory Visit (INDEPENDENT_AMBULATORY_CARE_PROVIDER_SITE_OTHER): Payer: Medicaid Other

## 2018-09-08 DIAGNOSIS — Z3480 Encounter for supervision of other normal pregnancy, unspecified trimester: Secondary | ICD-10-CM

## 2018-09-08 DIAGNOSIS — Z3483 Encounter for supervision of other normal pregnancy, third trimester: Secondary | ICD-10-CM

## 2018-09-08 DIAGNOSIS — Z23 Encounter for immunization: Secondary | ICD-10-CM | POA: Diagnosis not present

## 2018-09-08 NOTE — Progress Notes (Signed)
   PRENATAL VISIT NOTE  Subjective:  Jodi Terrell is a 23 y.o. G4P1021 at [redacted]w[redacted]d being seen today for ongoing prenatal care.  She is currently monitored for the following issues for this low-risk pregnancy and problems as listed below.  Patient reports pelvic pressure, but denies pain and endorses fetal movement.  She denies contractions and vaginal concerns including discharge, bleeding, leaking, itching, odor,or irritation.   Patient Active Problem List   Diagnosis Date Noted  . Exposure to chlamydia 05/11/2018  . Rubella non-immune status, antepartum 03/30/2018  . Supervision of other normal pregnancy, antepartum 03/22/2018  . Maternal morbid obesity, antepartum (HCC) 11/30/2013    The following portions of the patient's history were reviewed and updated as appropriate: allergies, current medications, past family history, past medical history, past social history, past surgical history and problem list. Problem list updated.  Objective:   Vitals:   09/08/18 1046  BP: 107/73  Pulse: 88  Weight: 229 lb 8 oz (104.1 kg)    Fetal Status: Fetal Heart Rate (bpm): 152   Movement: Present     General:  Alert, oriented and cooperative. Patient is in no acute distress.  Skin: Skin is warm and dry. No rash noted.   Cardiovascular: Normal heart rate noted  Respiratory: Normal respiratory effort, no problems with respiration noted  Abdomen: Soft, gravid, appropriate for gestational age.  Pain/Pressure: Present     Pelvic: Cervical exam deferred        Extremities: Normal range of motion.  Edema: None  Mental Status: Normal mood and affect. Normal behavior. Normal judgment and thought content.   Assessment and Plan:  Pregnancy: G4P1021 at [redacted]w[redacted]d  1. Supervision of other normal pregnancy, antepartum -Reassurances given regarding normalcy of feelings of pressure during pregnancy *Encouraged to monitor and report worsening and/or onset of new symptoms -Anticipatory guidance regarding  upcoming appts *Plan for GC/CT and GBS culture at next visit -Discussed PP contraception *Patient expresses desire for Nexplanon placement in hospital-informed that it would have to be after discharge d/t age. -Desires infant circumcision; expressed disappointment about not being able to make payments -Requests note for pregnancy confirmation with EDD so that she can be rehired   2. Maternal morbid obesity, antepartum (HCC)  -Discussed US findings 1924gm (4lbs 4oz) 77% -Informed of upcoming Growth [place] on 09/15/2018  Preterm labor symptoms and general obstetric precautions including but not limited to vaginal bleeding, contractions, leaking of fluid and fetal movement were reviewed in detail with the patient. Please refer to After Visit Summary for other counseling recommendations.  Return in about 2 weeks (around 09/22/2018) for ROB with GBS & GC/CT.  Future Appointments  Date Time Provider Department Center  09/15/2018  1:30 PM [facility] [place] 1 [facility]    Cherre Robins, CNM

## 2018-09-08 NOTE — Progress Notes (Signed)
ROB.  TDAP given in LD, tolerated well. 

## 2018-09-15 ENCOUNTER — Other Ambulatory Visit (HOSPITAL_COMMUNITY): Payer: Self-pay | Admitting: *Deleted

## 2018-09-15 ENCOUNTER — Encounter (HOSPITAL_COMMUNITY): Payer: Self-pay

## 2018-09-15 ENCOUNTER — Ambulatory Visit (HOSPITAL_COMMUNITY)
Admission: RE | Admit: 2018-09-15 | Discharge: 2018-09-15 | Disposition: A | Discharge status: Home / Self Care | Payer: Medicaid Other | Source: Ambulatory Visit | Attending: Nurse Practitioner | Admitting: Nurse Practitioner

## 2018-09-15 DIAGNOSIS — Z283 Underimmunization status: Secondary | ICD-10-CM | POA: Diagnosis not present

## 2018-09-15 DIAGNOSIS — O9989 Other specified diseases and conditions complicating pregnancy, childbirth and the puerperium: Secondary | ICD-10-CM | POA: Diagnosis not present

## 2018-09-15 DIAGNOSIS — Z2839 Other underimmunization status: Secondary | ICD-10-CM

## 2018-09-15 DIAGNOSIS — Z3A35 35 weeks gestation of pregnancy: Secondary | ICD-10-CM | POA: Diagnosis not present

## 2018-09-15 DIAGNOSIS — O99213 Obesity complicating pregnancy, third trimester: Secondary | ICD-10-CM | POA: Diagnosis not present

## 2018-09-15 DIAGNOSIS — Z3480 Encounter for supervision of other normal pregnancy, unspecified trimester: Secondary | ICD-10-CM | POA: Diagnosis not present

## 2018-09-22 ENCOUNTER — Encounter: Payer: Self-pay | Admitting: Nurse Practitioner

## 2018-09-22 ENCOUNTER — Ambulatory Visit (INDEPENDENT_AMBULATORY_CARE_PROVIDER_SITE_OTHER): Payer: Medicaid Other | Admitting: Nurse Practitioner

## 2018-09-22 ENCOUNTER — Other Ambulatory Visit (HOSPITAL_COMMUNITY)
Admission: RE | Admit: 2018-09-22 | Discharge: 2018-09-22 | Disposition: A | Discharge status: Home / Self Care | Payer: Medicaid Other | Source: Ambulatory Visit | Attending: Nurse Practitioner | Admitting: Nurse Practitioner

## 2018-09-22 VITALS — BP 111/67 | HR 96 | Wt 229.4 lb

## 2018-09-22 DIAGNOSIS — Z3480 Encounter for supervision of other normal pregnancy, unspecified trimester: Secondary | ICD-10-CM | POA: Insufficient documentation

## 2018-09-22 DIAGNOSIS — Z3483 Encounter for supervision of other normal pregnancy, third trimester: Secondary | ICD-10-CM

## 2018-09-22 DIAGNOSIS — O99213 Obesity complicating pregnancy, third trimester: Secondary | ICD-10-CM

## 2018-09-22 DIAGNOSIS — Z3A36 36 weeks gestation of pregnancy: Secondary | ICD-10-CM

## 2018-09-22 DIAGNOSIS — O99210 Obesity complicating pregnancy, unspecified trimester: Secondary | ICD-10-CM

## 2018-09-22 NOTE — Progress Notes (Signed)
    Subjective:  Jodi Terrell is a 23 y.o. [phone] at [redacted]w[redacted]d being seen today for ongoing prenatal care.  She is currently monitored for the following issues for this low-risk pregnancy and has Maternal morbid obesity, antepartum (HCC); Supervision of other normal pregnancy, antepartum; Rubella non-immune status, antepartum; and Exposure to chlamydia on their problem list.  Patient reports sharp prickly pains in her back that did not occur with her previous pregnancy.  No change in range of motion..  Contractions: Irritability. Vag. Bleeding: None.  Movement: Present. Denies leaking of fluid.   The following portions of the patient's history were reviewed and updated as appropriate: allergies, current medications, past family history, past medical history, past social history, past surgical history and problem list. Problem list updated.  Objective:   Vitals:   09/22/18 1024  BP: 111/67  Pulse: 96  Weight: 229 lb 6.4 oz (104.1 kg)    Fetal Status: Fetal Heart Rate (bpm): 148 Fundal Height: 44 cm Movement: Present  Presentation: Vertex  General:  Alert, oriented and cooperative. Patient is in no acute distress.  Skin: Skin is warm and dry. No rash noted.   Cardiovascular: Normal heart rate noted  Respiratory: Normal respiratory effort, no problems with respiration noted  Abdomen: Soft, gravid, appropriate for gestational age. Pain/Pressure: Present     Pelvic:  Cervical exam performed Dilation: Closed Effacement (%): Thick Station: Ballotable  Extremities: Normal range of motion.  Edema: None  Mental Status: Normal mood and affect. Normal behavior. Normal judgment and thought content.   Urinalysis:      Assessment and Plan:  Pregnancy: G4P1021 at [redacted]w[redacted]d  1. Supervision of other normal pregnancy, antepartum Back pain likely due to position of the baby  - Strep Gp B NAA - Cervicovaginal ancillary only( [place])  2. Maternal morbid obesity, antepartum (HCC) Plan initiated  by Dr. Jolayne Panther at a previous visit - US FETAL BPP W/NONSTRESS; Future  Preterm labor symptoms and general obstetric precautions including but not limited to vaginal bleeding, contractions, leaking of fluid and fetal movement were reviewed in detail with the patient. Please refer to After Visit Summary for other counseling recommendations.  Return in about 1 week (around 09/29/2018).  Nolene Bernheim, RN, MSN, NP-BC Nurse Practitioner, [facility] for [employer], [facility] 09/22/2018 11:06 AM

## 2018-09-22 NOTE — Patient Instructions (Signed)
Braxton Hicks Contractions Contractions of the uterus can occur throughout pregnancy, but they are not always a sign that you are in labor. You may have practice contractions called Braxton Hicks contractions. These false labor contractions are sometimes confused with true labor. What are Braxton Hicks contractions? Braxton Hicks contractions are tightening movements that occur in the muscles of the uterus before labor. Unlike true labor contractions, these contractions do not result in opening (dilation) and thinning of the cervix. Toward the end of pregnancy (32-34 weeks), Braxton Hicks contractions can happen more often and may become stronger. These contractions are sometimes difficult to tell apart from true labor because they can be very uncomfortable. You should not feel embarrassed if you go to the hospital with false labor. Sometimes, the only way to tell if you are in true labor is for your health care provider to look for changes in the cervix. The health care provider will do a physical exam and may monitor your contractions. If you are not in true labor, the exam should show that your cervix is not dilating and your water has not broken. If there are no other health problems associated with your pregnancy, it is completely safe for you to be sent home with false labor. You may continue to have Braxton Hicks contractions until you go into true labor. How to tell the difference between true labor and false labor True labor  Contractions last 30-70 seconds.  Contractions become very regular.  Discomfort is usually felt in the top of the uterus, and it spreads to the lower abdomen and low back.  Contractions do not go away with walking.  Contractions usually become more intense and increase in frequency.  The cervix dilates and gets thinner. False labor  Contractions are usually shorter and not as strong as true labor contractions.  Contractions are usually irregular.  Contractions  are often felt in the front of the lower abdomen and in the groin.  Contractions may go away when you walk around or change positions while lying down.  Contractions get weaker and are shorter-lasting as time goes on.  The cervix usually does not dilate or become thin. Follow these instructions at home:   Take over-the-counter and prescription medicines only as told by your health care provider.  Keep up with your usual exercises and follow other instructions from your health care provider.  Eat and drink lightly if you think you are going into labor.  If Braxton Hicks contractions are making you uncomfortable: ? Change your position from lying down or resting to walking, or change from walking to resting. ? Sit and rest in a tub of warm water. ? Drink enough fluid to keep your urine pale yellow. Dehydration may cause these contractions. ? Do slow and deep breathing several times an hour.  Keep all follow-up prenatal visits as told by your health care provider. This is important. Contact a health care provider if:  You have a fever.  You have continuous pain in your abdomen. Get help right away if:  Your contractions become stronger, more regular, and closer together.  You have fluid leaking or gushing from your vagina.  You pass blood-tinged mucus (bloody show).  You have bleeding from your vagina.  You have low back pain that you never had before.  You feel your baby's head pushing down and causing pelvic pressure.  Your baby is not moving inside you as much as it used to. Summary  Contractions that occur before labor are   called Braxton Hicks contractions, false labor, or practice contractions.  Braxton Hicks contractions are usually shorter, weaker, farther apart, and less regular than true labor contractions. True labor contractions usually become progressively stronger and regular, and they become more frequent.  Manage discomfort from Braxton Hicks contractions  by changing position, resting in a warm bath, drinking plenty of water, or practicing deep breathing. This information is not intended to replace advice given to you by your health care provider. Make sure you discuss any questions you have with your health care provider. Document Released: 12/11/2016 Document Revised: 05/12/2017 Document Reviewed: 12/11/2016 Elsevier Interactive Patient Education  2019 [employer]  

## 2018-09-23 LAB — CERVICOVAGINAL ANCILLARY ONLY
Chlamydia: POSITIVE — AB
Neisseria Gonorrhea: NEGATIVE

## 2018-09-24 ENCOUNTER — Telehealth: Payer: Self-pay | Admitting: *Deleted

## 2018-09-24 LAB — STREP GP B NAA: Strep Gp B NAA: POSITIVE — AB

## 2018-09-24 NOTE — Telephone Encounter (Signed)
Attempt to contact pt about recent lab results. Pt is +CH and +GBS. Treatment has not been sent.  LM on VM for pt to call office.

## 2018-09-27 ENCOUNTER — Encounter: Payer: Self-pay | Admitting: Nurse Practitioner

## 2018-09-27 DIAGNOSIS — O98819 Other maternal infectious and parasitic diseases complicating pregnancy, unspecified trimester: Secondary | ICD-10-CM

## 2018-09-27 DIAGNOSIS — B951 Streptococcus, group B, as the cause of diseases classified elsewhere: Secondary | ICD-10-CM

## 2018-09-27 HISTORY — DX: Streptococcus, group b, as the cause of diseases classified elsewhere: B95.1

## 2018-09-27 HISTORY — DX: Other maternal infectious and parasitic diseases complicating pregnancy, unspecified trimester: O98.819

## 2018-09-27 MED ORDER — AZITHROMYCIN 250 MG PO TABS: 1000.0000 mg | ORAL_TABLET | Freq: Once | ORAL | 0 refills | Status: AC

## 2018-09-27 NOTE — Telephone Encounter (Signed)
Pt returned all. LVM for pt to c/b

## 2018-09-27 NOTE — Telephone Encounter (Signed)
Notified patient of positive CT. She will pick up her rx from the pharmacy. Pt agrees to have partner treated and no IC until TOC is neg.Notification faxed to [facility].

## 2018-09-27 NOTE — Addendum Note (Signed)
Addended by: Currie Paris on: 09/27/2018 11:05 AM   Modules accepted: Orders

## 2018-09-27 NOTE — Progress Notes (Signed)
Chlamydia positive.  Medication sent to her pharmacy.  Office staff has left a message for her to call the office.  Nolene Bernheim, RN, MSN, NP-BC Nurse Practitioner, [facility] for [employer], [facility] 09/27/2018 11:05 AM

## 2018-09-30 ENCOUNTER — Encounter: Payer: Self-pay | Admitting: Certified Nurse Midwife

## 2018-09-30 ENCOUNTER — Other Ambulatory Visit: Payer: Self-pay | Admitting: Nurse Practitioner

## 2018-09-30 ENCOUNTER — Ambulatory Visit (INDEPENDENT_AMBULATORY_CARE_PROVIDER_SITE_OTHER): Payer: Medicaid Other | Admitting: Certified Nurse Midwife

## 2018-09-30 ENCOUNTER — Ambulatory Visit (HOSPITAL_COMMUNITY)
Admission: RE | Admit: 2018-09-30 | Discharge: 2018-09-30 | Disposition: A | Discharge status: Home / Self Care | Payer: Medicaid Other | Source: Ambulatory Visit | Attending: Nurse Practitioner | Admitting: Nurse Practitioner

## 2018-09-30 VITALS — BP 105/68 | HR 90 | Wt 233.0 lb

## 2018-09-30 DIAGNOSIS — O9989 Other specified diseases and conditions complicating pregnancy, childbirth and the puerperium: Secondary | ICD-10-CM

## 2018-09-30 DIAGNOSIS — Z2839 Other underimmunization status: Secondary | ICD-10-CM

## 2018-09-30 DIAGNOSIS — Z3A37 37 weeks gestation of pregnancy: Secondary | ICD-10-CM

## 2018-09-30 DIAGNOSIS — Z3480 Encounter for supervision of other normal pregnancy, unspecified trimester: Secondary | ICD-10-CM

## 2018-09-30 DIAGNOSIS — O99210 Obesity complicating pregnancy, unspecified trimester: Secondary | ICD-10-CM | POA: Diagnosis not present

## 2018-09-30 DIAGNOSIS — Z3483 Encounter for supervision of other normal pregnancy, third trimester: Secondary | ICD-10-CM

## 2018-09-30 DIAGNOSIS — O98313 Other infections with a predominantly sexual mode of transmission complicating pregnancy, third trimester: Secondary | ICD-10-CM | POA: Insufficient documentation

## 2018-09-30 DIAGNOSIS — O99213 Obesity complicating pregnancy, third trimester: Secondary | ICD-10-CM | POA: Diagnosis not present

## 2018-09-30 DIAGNOSIS — A568 Sexually transmitted chlamydial infection of other sites: Secondary | ICD-10-CM

## 2018-09-30 DIAGNOSIS — O99891 Other specified diseases and conditions complicating pregnancy: Secondary | ICD-10-CM

## 2018-09-30 DIAGNOSIS — Z283 Underimmunization status: Secondary | ICD-10-CM

## 2018-09-30 HISTORY — DX: Other infections with a predominantly sexual mode of transmission complicating pregnancy, third trimester: A56.8

## 2018-09-30 HISTORY — DX: Other infections with a predominantly sexual mode of transmission complicating pregnancy, third trimester: O98.313

## 2018-09-30 NOTE — Progress Notes (Signed)
Patient reports fetal movement, denies pain. Pt states that she took antibiotics for +CH.

## 2018-09-30 NOTE — Progress Notes (Signed)
   PRENATAL VISIT NOTE  Subjective:  Jodi Terrell is a 23 y.o. [phone] at 59w1dbeing seen today for ongoing prenatal care.  She is currently monitored for the following issues for this low-risk pregnancy and has Maternal morbid obesity, [place]; Supervision of other normal pregnancy, antepartum; Rubella non-immune status, antepartum; Exposure to chlamydia; Group B streptococcal infection during pregnancy; and Chlamydia trachomatis infection during pregnancy in third trimester on their problem list.  Patient reports no complaints.  Contractions: Not present. Vag. Bleeding: None.  Movement: Present. Denies leaking of fluid.   The following portions of the patient's history were reviewed and updated as appropriate: allergies, current medications, past family history, past medical history, past social history, past surgical history and problem list. Problem list updated.  Objective:   Vitals:   09/30/18 0948  BP: 105/68  Pulse: 90  Weight: 233 lb (105.7 kg)    Fetal Status: Fetal Heart Rate (bpm): 140 Fundal Height: 42 cm Movement: Present     General:  Alert, oriented and cooperative. Patient is in no acute distress.  Skin: Skin is warm and dry. No rash noted.   Cardiovascular: Normal heart rate noted  Respiratory: Normal respiratory effort, no problems with respiration noted  Abdomen: Soft, gravid, appropriate for gestational age.  Pain/Pressure: Absent     Pelvic: Cervical exam deferred        Extremities: Normal range of motion.  Edema: None  Mental Status: Normal mood and affect. Normal behavior. Normal judgment and thought content.   Assessment and Plan:  Pregnancy: G4P1021 at 373w1d1. Supervision of other normal pregnancy, antepartum - Routine prenatal care  - Anticipatory guidance on upcoming appointments  - GBS positive, needs treatment intrapartum   2. Chlamydia trachomatis infection during pregnancy in third trimester - Patient reports taking medication,  TOC needed prior to delivery   3. Rubella non-immune status, antepartum - MMR PP   Term labor symptoms and general obstetric precautions including but not limited to vaginal bleeding, contractions, leaking of fluid and fetal movement were reviewed in detail with the patient. Please refer to After Visit Summary for other counseling recommendations.  Return in about 1 week (around 10/07/2018) for ROB.  Future Appointments  Date Time Provider DePierson2/28/2020  9:00 AM ErChancy MilroyMD [facility]  10/13/2018 11:00 AM WHCoahomaSKorea WH-MFCUS M[facility]-US  10/14/2018 10:30 AM Anyanwu, UgSallyanne HaversMD [facility]    VeLajean ManesCNM

## 2018-09-30 NOTE — Patient Instructions (Signed)
Reasons to go to MAU:  1.  Contractions are  5 minutes apart or less, each last 1 minute, these have been going on for 1-2 hours, and you cannot walk or talk during them 2.  You have a large gush of fluid, or a trickle of fluid that will not stop and you have to wear a pad 3.  You have bleeding that is bright red, heavier than spotting--like menstrual bleeding (spotting can be normal in early labor or after a check of your cervix) 4.  You do not feel the baby moving like he/she normally does  

## 2018-10-08 ENCOUNTER — Encounter: Payer: Self-pay | Admitting: Obstetrics and Gynecology

## 2018-10-08 ENCOUNTER — Other Ambulatory Visit (HOSPITAL_COMMUNITY)
Admission: RE | Admit: 2018-10-08 | Discharge: 2018-10-08 | Disposition: A | Discharge status: Home / Self Care | Payer: Medicaid Other | Source: Ambulatory Visit | Attending: Obstetrics and Gynecology | Admitting: Obstetrics and Gynecology

## 2018-10-08 ENCOUNTER — Ambulatory Visit (INDEPENDENT_AMBULATORY_CARE_PROVIDER_SITE_OTHER): Payer: Medicaid Other | Admitting: Obstetrics and Gynecology

## 2018-10-08 VITALS — BP 111/72 | HR 93 | Wt 234.2 lb

## 2018-10-08 DIAGNOSIS — A568 Sexually transmitted chlamydial infection of other sites: Secondary | ICD-10-CM | POA: Insufficient documentation

## 2018-10-08 DIAGNOSIS — O98313 Other infections with a predominantly sexual mode of transmission complicating pregnancy, third trimester: Secondary | ICD-10-CM | POA: Diagnosis not present

## 2018-10-08 DIAGNOSIS — Z3A38 38 weeks gestation of pregnancy: Secondary | ICD-10-CM

## 2018-10-08 DIAGNOSIS — O99210 Obesity complicating pregnancy, unspecified trimester: Secondary | ICD-10-CM

## 2018-10-08 DIAGNOSIS — Z3483 Encounter for supervision of other normal pregnancy, third trimester: Secondary | ICD-10-CM

## 2018-10-08 DIAGNOSIS — Z3480 Encounter for supervision of other normal pregnancy, unspecified trimester: Secondary | ICD-10-CM

## 2018-10-08 DIAGNOSIS — Z283 Underimmunization status: Secondary | ICD-10-CM

## 2018-10-08 DIAGNOSIS — B951 Streptococcus, group B, as the cause of diseases classified elsewhere: Secondary | ICD-10-CM

## 2018-10-08 DIAGNOSIS — O98813 Other maternal infectious and parasitic diseases complicating pregnancy, third trimester: Secondary | ICD-10-CM

## 2018-10-08 DIAGNOSIS — O99213 Obesity complicating pregnancy, third trimester: Secondary | ICD-10-CM

## 2018-10-08 DIAGNOSIS — Z2839 Other underimmunization status: Secondary | ICD-10-CM

## 2018-10-08 DIAGNOSIS — O9989 Other specified diseases and conditions complicating pregnancy, childbirth and the puerperium: Secondary | ICD-10-CM

## 2018-10-08 DIAGNOSIS — O98819 Other maternal infectious and parasitic diseases complicating pregnancy, unspecified trimester: Secondary | ICD-10-CM

## 2018-10-08 NOTE — Progress Notes (Signed)
Pt presents for ROB has no complaints today.  Needs TOC Chlamydia

## 2018-10-08 NOTE — Progress Notes (Signed)
Subjective:  Jodi Terrell is a 23 y.o. [phone] at [redacted]w[redacted]d being seen today for ongoing prenatal care.  She is currently monitored for the following issues for this high-risk pregnancy and has Maternal morbid obesity, antepartum (HCC); Supervision of other normal pregnancy, antepartum; Rubella non-immune status, antepartum; Exposure to chlamydia; Group B streptococcal infection during pregnancy; and Chlamydia trachomatis infection during pregnancy in third trimester on their problem list.  Patient reports no complaints.  Contractions: Irritability. Vag. Bleeding: None.  Movement: Present. Denies leaking of fluid.   The following portions of the patient's history were reviewed and updated as appropriate: allergies, current medications, past family history, past medical history, past social history, past surgical history and problem list. Problem list updated.  Objective:   Vitals:   10/08/18 0853  BP: 111/72  Pulse: 93  Weight: 234 lb 3.2 oz (106.2 kg)    Fetal Status: Fetal Heart Rate (bpm): 144   Movement: Present     General:  Alert, oriented and cooperative. Patient is in no acute distress.  Skin: Skin is warm and dry. No rash noted.   Cardiovascular: Normal heart rate noted  Respiratory: Normal respiratory effort, no problems with respiration noted  Abdomen: Soft, gravid, appropriate for gestational age. Pain/Pressure: Present     Pelvic:  Cervical exam deferred        Extremities: Normal range of motion.  Edema: Trace  Mental Status: Normal mood and affect. Normal behavior. Normal judgment and thought content.   Urinalysis:      Assessment and Plan:  Pregnancy: G4P1021 at [redacted]w[redacted]d  1. Supervision of other normal pregnancy, antepartum Labor preacutions  2. Chlamydia trachomatis infection during pregnancy in third trimester TOC today - Cervicovaginal ancillary only( [place])  3. Maternal morbid obesity, antepartum (HCC) BPP not ordered by MFM this week. Ordered for next  week instead  4. Rubella non-immune status, antepartum Vaccine PP  5. Group B streptococcal infection during pregnancy Tx while in labor  Term labor symptoms and general obstetric precautions including but not limited to vaginal bleeding, contractions, leaking of fluid and fetal movement were reviewed in detail with the patient. Please refer to After Visit Summary for other counseling recommendations.  Return in about 1 week (around 10/15/2018) for OB visit.   Hermina Staggers, MD

## 2018-10-11 LAB — CERVICOVAGINAL ANCILLARY ONLY
Chlamydia: NEGATIVE
Neisseria Gonorrhea: NEGATIVE

## 2018-10-13 ENCOUNTER — Ambulatory Visit (HOSPITAL_COMMUNITY)
Admission: RE | Admit: 2018-10-13 | Discharge: 2018-10-13 | Disposition: A | Discharge status: Home / Self Care | Payer: Medicaid Other | Source: Ambulatory Visit | Attending: Nurse Practitioner | Admitting: Nurse Practitioner

## 2018-10-13 ENCOUNTER — Ambulatory Visit (HOSPITAL_COMMUNITY): Payer: Medicaid Other | Admitting: *Deleted

## 2018-10-13 ENCOUNTER — Encounter (HOSPITAL_COMMUNITY): Payer: Self-pay

## 2018-10-13 VITALS — BP 108/74 | HR 92 | Wt 237.4 lb

## 2018-10-13 DIAGNOSIS — O99210 Obesity complicating pregnancy, unspecified trimester: Secondary | ICD-10-CM | POA: Diagnosis not present

## 2018-10-13 DIAGNOSIS — O99213 Obesity complicating pregnancy, third trimester: Secondary | ICD-10-CM | POA: Diagnosis not present

## 2018-10-13 DIAGNOSIS — Z3A29 29 weeks gestation of pregnancy: Secondary | ICD-10-CM

## 2018-10-13 DIAGNOSIS — Z362 Encounter for other antenatal screening follow-up: Secondary | ICD-10-CM | POA: Diagnosis not present

## 2018-10-14 ENCOUNTER — Ambulatory Visit (INDEPENDENT_AMBULATORY_CARE_PROVIDER_SITE_OTHER): Payer: Medicaid Other | Admitting: Obstetrics & Gynecology

## 2018-10-14 VITALS — BP 107/69 | HR 97 | Wt 234.0 lb

## 2018-10-14 DIAGNOSIS — O3663X3 Maternal care for excessive fetal growth, third trimester, fetus 3: Secondary | ICD-10-CM

## 2018-10-14 DIAGNOSIS — B951 Streptococcus, group B, as the cause of diseases classified elsewhere: Secondary | ICD-10-CM

## 2018-10-14 DIAGNOSIS — Z3480 Encounter for supervision of other normal pregnancy, unspecified trimester: Secondary | ICD-10-CM

## 2018-10-14 DIAGNOSIS — O98813 Other maternal infectious and parasitic diseases complicating pregnancy, third trimester: Secondary | ICD-10-CM

## 2018-10-14 DIAGNOSIS — O98819 Other maternal infectious and parasitic diseases complicating pregnancy, unspecified trimester: Secondary | ICD-10-CM

## 2018-10-14 DIAGNOSIS — O3663X0 Maternal care for excessive fetal growth, third trimester, not applicable or unspecified: Secondary | ICD-10-CM

## 2018-10-14 DIAGNOSIS — O403XX0 Polyhydramnios, third trimester, not applicable or unspecified: Secondary | ICD-10-CM

## 2018-10-14 DIAGNOSIS — Z3A39 39 weeks gestation of pregnancy: Secondary | ICD-10-CM

## 2018-10-14 HISTORY — DX: Polyhydramnios, third trimester, not applicable or unspecified: O40.3XX0

## 2018-10-14 HISTORY — DX: Maternal care for excessive fetal growth, third trimester, not applicable or unspecified: O36.63X0

## 2018-10-14 NOTE — Progress Notes (Signed)
PRENATAL VISIT NOTE  Subjective:  Jodi Terrell is a 23 y.o. [phone] at [redacted]w[redacted]d being seen today for ongoing prenatal care.  She is currently monitored for the following issues for this low-risk pregnancy and has Maternal morbid obesity, antepartum (HCC); Supervision of other normal pregnancy, antepartum; Rubella non-immune status, antepartum; Exposure to chlamydia; Group B streptococcal infection during pregnancy; Chlamydia trachomatis infection during pregnancy in third trimester; Polyhydramnios in third trimester; and LGA (large for gestational age) fetus affecting management of mother, third trimester on their problem list.  Patient reports occasional contractions.  Contractions: Irregular. Vag. Bleeding: None.  Movement: Present. Denies leaking of fluid.   The following portions of the patient's history were reviewed and updated as appropriate: allergies, current medications, past family history, past medical history, past social history, past surgical history and problem list. Problem list updated.  Objective:   Vitals:   10/14/18 1035  BP: 107/69  Pulse: 97  Weight: 234 lb (106.1 kg)    Fetal Status: Fetal Heart Rate (bpm): 143   Movement: Present  Presentation: Vertex  General:  Alert, oriented and cooperative. Patient is in no acute distress.  Skin: Skin is warm and dry. No rash noted.   Cardiovascular: Normal heart rate noted  Respiratory: Normal respiratory effort, no problems with respiration noted  Abdomen: Soft, gravid, appropriate for gestational age.  Pain/Pressure: Present     Pelvic: Cervical exam performed Dilation: 3 Effacement (%): 40 Station: -3  Extremities: Normal range of motion.  Edema: Trace  Mental Status: Normal mood and affect. Normal behavior. Normal judgment and thought content.   [place] Mfm Fetal Bpp Wo Non Stress  Result Date: 09/30/2018 ----------------------------------------------------------------------  OBSTETRICS REPORT                        (Signed Final 09/30/2018 08:06 pm) ---------------------------------------------------------------------- Patient Info  ID #:       [id number]                          D.O.B.:  04-22-1996 (22 yrs)  Name:       Jodi Terrell               Visit Date: 09/30/2018 07:46 am ---------------------------------------------------------------------- Performed By  Performed By:     Lenise Arena        Ref. Address:      Faculty                    RDMS  Attending:        Lin Landsman      Location:          [facility]                    MD  Referred By:      Catalina Antigua MD ---------------------------------------------------------------------- Orders   #  Description                          Code         Ordered By   1  [place] MFM FETAL BPP WO NON              33295.18     TERRI BURLESON      STRESS  ----------------------------------------------------------------------   #  Order #                    Accession #                 Episode #   1  [id number]                  [id number]                  [id number]  ---------------------------------------------------------------------- Indications   Obesity complicating pregnancy, third          O99.213   trimester   Low Risk NIPS   [redacted] weeks gestation of pregnancy                Z3A.37  ---------------------------------------------------------------------- Vital Signs  Weight (lb): 229                               Height:        5'1"  BMI:         43.26 ---------------------------------------------------------------------- Fetal Evaluation  Num Of Fetuses:          1  Fetal Heart Rate(bpm):   136  Cardiac Activity:        Observed  Presentation:            Cephalic  Placenta:                Posterior  Amniotic Fluid  AFI FV:      Within normal limits  AFI Sum(cm)     %Tile       Largest Pocket(cm)  18.89           72          6.32  RUQ(cm)       RLQ(cm)       LUQ(cm)        LLQ(cm)  3.33          6.32          3.48           5.76  ---------------------------------------------------------------------- Biophysical Evaluation  Amniotic F.V:   Within normal limits       F. Tone:         Observed  F. Movement:    Observed                   Score:           8/8  F. Breathing:   Observed ---------------------------------------------------------------------- OB History  Gravidity:    4         Term:   1         SAB:   2  Living:       1 ---------------------------------------------------------------------- Gestational Age  LMP:           38w 0d        Date:  01/07/18                 EDD:   10/14/18  Clinical EDD:  37w 1d                                        EDD:   10/20/18  Best:          37w 1d     Det. By:  Clinical EDD  EDD:   10/20/18 ---------------------------------------------------------------------- Anatomy  Thoracic:              Appears normal         Kidneys:                Appear normal  Diaphragm:             Appears normal         Bladder:                Appears normal  Stomach:               Appears normal, left                         sided ---------------------------------------------------------------------- Cervix Uterus Adnexa  Cervix  Not visualized (advanced GA >24wks) ---------------------------------------------------------------------- Impression  Biophysical profile 8/8 ---------------------------------------------------------------------- Recommendations  Follow up as clinically indicted. ----------------------------------------------------------------------               Lin Landsman, MD Electronically Signed Final Report   09/30/2018 08:06 pm ----------------------------------------------------------------------  [place] Mfm Ob Follow Up  Result Date: 10/13/2018 ----------------------------------------------------------------------  OBSTETRICS REPORT                       (Signed Final 10/13/2018 11:39 am) ---------------------------------------------------------------------- Patient Info  ID #:        [id number]                          D.O.B.:  1995-11-20 (22 yrs)  Name:       Jodi Terrell               Visit Date: 10/13/2018 11:09 am ---------------------------------------------------------------------- Performed By  Performed By:     Earley Brooke     Ref. Address:     [address] [place]                                                             Road                                                             [address]                                                             [place]                                                             [address]  Attending:  Noralee Space MD        [facility]  Referred By:      [facility] ---------------------------------------------------------------------- Orders   #  Description                          Code         Ordered By   1  [place] MFM OB FOLLOW UP                  16109.60     Lin Landsman  ----------------------------------------------------------------------   #  Order #                    Accession #                 Episode #   1  [id number]                  [id number]                  [id number]  ---------------------------------------------------------------------- Indications   Obesity complicating pregnancy, third          O99.213   trimester (BMI 41)   [redacted] weeks gestation of pregnancy                Z3A.39   Low Risk NIPS   Encounter for other antenatal screening        Z36.2   follow-up  ---------------------------------------------------------------------- Vital Signs                                                 Height:        5'1" ---------------------------------------------------------------------- Fetal Evaluation  Num Of Fetuses:         1  Fetal Heart Rate(bpm):  151  Cardiac Activity:        Observed  Presentation:           Cephalic  Placenta:               Posterior  P. Cord Insertion:      Previously seen as normal  Amniotic Fluid  AFI FV:      Subjectively increased  AFI Sum(cm)     %Tile       Largest Pocket(cm)  27.64           >  97        10.03  RUQ(cm)       RLQ(cm)       LUQ(cm)        LLQ(cm)  6.44          4.65          10.03          6.52 ---------------------------------------------------------------------- Biometry  BPD:      95.3  mm     G. Age:  38w 6d         79  %    CI:        81.59   %    70 - 86                                                          FL/HC:      21.7   %    20.6 - 23.4  HC:       333   mm     G. Age:  38w 0d         18  %    HC/AC:      0.88        0.87 - 1.06  AC:      378.9  mm     G. Age:  41w 6d       > 97  %    FL/BPD:     75.7   %    71 - 87  FL:       72.1  mm     G. Age:  37w 0d         12  %    FL/AC:      19.0   %    20 - 24  HUM:      63.6  mm     G. Age:  36w 6d         34  %  Est. FW:    3977  gm    8 lb 12 oz    > 90  % ---------------------------------------------------------------------- OB History  Gravidity:    4         Term:   1         SAB:   2  Living:       1 ---------------------------------------------------------------------- Gestational Age  LMP:           39w 6d        Date:  01/07/18                 EDD:   10/14/18  Clinical EDD:  39w 0d                                        EDD:   10/20/18  U/S Today:     39w 0d                                        EDD:   10/20/18  Best:          39w 0d  Det. By:  Clinical EDD             EDD:   10/20/18 ---------------------------------------------------------------------- Anatomy  Cranium:               Appears normal         LVOT:                   Previously seen  Cavum:                 Previously seen        Aortic Arch:            Previously seen  Ventricles:            Previously seen        Ductal Arch:            Previously seen  Choroid Plexus:        Previously seen         Diaphragm:              Appears normal  Cerebellum:            Previously seen        Stomach:                Appears normal, left                                                                        sided  Posterior Fossa:       Previously seen        Abdomen:                Appears normal  Nuchal Fold:           Previously seen        Abdominal Wall:         Previously seen  Face:                  Orbits and profile     Cord Vessels:           Previously seen                         previously seen  Lips:                  Previously seen        Kidneys:                Appear normal  Palate:                Previously seen        Bladder:                Appears normal  Thoracic:              Previously seen        Spine:                  Previously seen  Heart:                 Previously seen        Upper Extremities:  Previously seen  RVOT:                  Previously seen        Lower Extremities:      Previously seen  Other:  Female gender previously seen. Technically difficult due to fetal position          and maternal body habitus. Nasal bone and heels previously          visualized. ---------------------------------------------------------------------- Cervix Uterus Adnexa  Cervix  Not visualized (advanced GA >24wks) ---------------------------------------------------------------------- Impression  The estimated fetal weight is at greater than the 90th  percentile. Abdominal circumference measures at greater  than the 95th percentile. Mild polyhydramnios is seen.  Patient does not have gestational diabetes. She had a term  vaginal delivery.  I explained the findings that the cause of polyhydramnios is  not usually known in about 50% of cases (idiopathic).  BP at our office: 108/74 mm Hg. ---------------------------------------------------------------------- Recommendations  Consider delivery at 40 weeks. ----------------------------------------------------------------------                  Noralee Space,  MD Electronically Signed Final Report   10/13/2018 11:39 am ----------------------------------------------------------------------  [place] Mfm Ob Follow Up  Result Date: 09/15/2018 ----------------------------------------------------------------------  OBSTETRICS REPORT                       (Signed Final 09/15/2018 09:20 pm) ---------------------------------------------------------------------- Patient Info  ID #:       [id number]                          D.O.B.:  1995/12/25 (22 yrs)  Name:       Jodi Paci Bitner               Visit Date: 09/15/2018 01:28 pm ---------------------------------------------------------------------- Performed By  Performed By:     Lenise Arena        Ref. Address:      Faculty                    RDMS  Attending:        Lin Landsman      Location:          [facility]                    MD  Referred By:      Catalina Antigua MD ---------------------------------------------------------------------- Orders   #  Description                          Code         Ordered By   1  [place] MFM OB FOLLOW UP                  24401.02     Lin Landsman  ----------------------------------------------------------------------   #  Order #  Accession #                 Episode #   1  [id number]                  [id number]                  [id number]  ---------------------------------------------------------------------- Indications   Obesity complicating pregnancy, third          O99.213   trimester   Low Risk NIPS   [redacted] weeks gestation of pregnancy                Z3A.35  ---------------------------------------------------------------------- Vital Signs  Weight (lb): 229                               Height:        5'1"  BMI:         43.26 ---------------------------------------------------------------------- Fetal Evaluation  Num Of Fetuses:          1  Fetal Heart Rate(bpm):   140  Cardiac Activity:         Observed  Presentation:            Cephalic  Placenta:                Posterior  P. Cord Insertion:       Previously Visualized  Amniotic Fluid  AFI FV:      Within normal limits  AFI Sum(cm)     %Tile       Largest Pocket(cm)  20.68           78          7.29  RUQ(cm)       RLQ(cm)       LUQ(cm)        LLQ(cm)  4.47          7.29          3.05           5.87 ---------------------------------------------------------------------- Biometry  BPD:      91.5  mm     G. Age:  37w 1d         95  %    CI:        75.43   %    70 - 86                                                          FL/HC:       20.3  %    20.1 - 22.3  HC:      334.1  mm     G. Age:  38w 1d         88  %    HC/AC:       1.00       0.93 - 1.11  AC:      334.3  mm     G. Age:  37w 2d       > 97  %    FL/BPD:      74.0  %    71 - 87  FL:       67.7  mm     G. Age:  34w 5d         38  %    FL/AC:       20.3  %    20 - 24  HUM:      59.1  mm     G. Age:  34w 1d         51  %  Est. FW:    3033   gm   6 lb 11 oz      88  % ---------------------------------------------------------------------- OB History  Gravidity:    4         Term:   1         SAB:   2  Living:       1 ---------------------------------------------------------------------- Gestational Age  LMP:           35w 6d        Date:  01/07/18                 EDD:   10/14/18  Clinical EDD:  Consuello Closs 0d                                        EDD:   10/20/18  U/S Today:     36w 6d                                        EDD:   10/07/18  Best:          35w 0d     Det. By:  Clinical EDD             EDD:   10/20/18 ---------------------------------------------------------------------- Anatomy  Cranium:               Appears normal         LVOT:                   Previously seen  Cavum:                 Appears normal         Aortic Arch:            Previously seen  Ventricles:            Previously seen        Ductal Arch:            Previously seen  Choroid Plexus:        Previously seen        Diaphragm:               Appears normal  Cerebellum:            Previously seen        Stomach:                Appears normal, left                                                                        sided  Posterior Fossa:  Previously seen        Abdomen:                Appears normal  Nuchal Fold:           Previously seen        Abdominal Wall:         Previously seen  Face:                  Orbits and profile     Cord Vessels:           Previously seen                         previously seen  Lips:                  Previously seen        Kidneys:                Appear normal  Palate:                Previously seen        Bladder:                Appears normal  Thoracic:              Appears normal         Spine:                  Previously seen  Heart:                 Appears normal         Upper Extremities:      Previously seen                         (4CH, axis, and                         situs)  RVOT:                  Previously seen        Lower Extremities:      Previously seen  Other:  Female gender. Technically difficult due to fetal position and maternal          body habitus. Nasal bone and heels previously visualized. ---------------------------------------------------------------------- Cervix Uterus Adnexa  Cervix  Not visualized (advanced GA >24wks) ---------------------------------------------------------------------- Impression  Normal interval growth. ---------------------------------------------------------------------- Recommendations  Consider growth in 3-4 weeks if undelivered. ----------------------------------------------------------------------               Lin Landsman, MD Electronically Signed Final Report   09/15/2018 09:20 pm ----------------------------------------------------------------------   Assessment and Plan:  Pregnancy: [id number] at [redacted]w[redacted]d  1. Polyhydramnios in third trimester 2. LGA (large for gestational age) fetus affecting management of mother, third trimester IOL  scheduled at 40 weeks as per MFM (see report above).  3. Group B streptococcal infection during pregnancy Needs intrapartum prophylaxis, orders placed  4. Supervision of other normal pregnancy, antepartum Term labor symptoms and general obstetric precautions including but not limited to vaginal bleeding, contractions, leaking of fluid and fetal movement were reviewed in detail with the patient. Please refer to After Visit Summary for other counseling recommendations.  Return in about 6 weeks (around 11/25/2018) for Postpartum check.   Jaynie Collins, MD

## 2018-10-14 NOTE — Patient Instructions (Signed)
Return to office for any scheduled appointments. Call the office or go to the MAU at [facility] if:  You begin to have strong, frequent contractions  Your water breaks.  Sometimes it is a big gush of fluid, sometimes it is just a trickle that keeps getting your panties wet or running down your legs  You have vaginal bleeding.  It is normal to have a small amount of spotting if your cervix was checked.   You do not feel your baby moving like normal.  If you do not, get something to eat and drink and lay down and focus on feeling your baby move.   If your baby is still not moving like normal, you should call the office or go to MAU.  Any other obstetric concerns.     Labor Induction  Labor induction is when steps are taken to cause a pregnant woman to begin the labor process. Most women go into labor on their own between 37 weeks and 42 weeks of pregnancy. When this does not happen or when there is a medical need for labor to begin, steps may be taken to induce labor. Labor induction causes a pregnant woman's uterus to contract. It also causes the cervix to soften (ripen), open (dilate), and thin out (efface). Usually, labor is not induced before 39 weeks of pregnancy unless there is a medical reason to do so. Your health care provider will determine if labor induction is needed. Before inducing labor, your health care provider will consider a number of factors, including:  Your medical condition and your baby's.  How many weeks along you are in your pregnancy.  How mature your baby's lungs are.  The condition of your cervix.  The position of your baby.  The size of your birth canal. What are some reasons for labor induction? Labor may be induced if:  Your health or your baby's health is at risk.  Your pregnancy is overdue by 1 week or more.  Your water breaks but labor does not start on its own.  There is a low amount of amniotic fluid around your  baby. You may also choose (elect) to have labor induced at a certain time. Generally, elective labor induction is done no earlier than 39 weeks of pregnancy. What methods are used for labor induction? Methods used for labor induction include:  Prostaglandin medicine. This medicine starts contractions and causes the cervix to dilate and ripen. It can be taken by mouth (orally) or by being inserted into the vagina (suppository).  Inserting a small, thin tube (catheter) with a balloon into the vagina and then expanding the balloon with water to dilate the cervix.  Stripping the membranes. In this method, your health care provider gently separates amniotic sac tissue from the cervix. This causes the cervix to stretch, which in turn causes the release of a hormone called progesterone. The hormone causes the uterus to contract. This procedure is often done during an office visit, after which you will be sent home to wait for contractions to begin.  Breaking the water. In this method, your health care provider uses a small instrument to make a small hole in the amniotic sac. This eventually causes the amniotic sac to break. Contractions should begin after a few hours.  Medicine to trigger or strengthen contractions. This medicine is given through an IV that is inserted into a vein in your arm. Except for membrane stripping, which can be done in a   clinic, labor induction is done in the hospital so that you and your baby can be carefully monitored. How long does it take for labor to be induced? The length of time it takes to induce labor depends on how ready your body is for labor. Some inductions can take up to 2-3 days, while others may take less than a day. Induction may take longer if:  You are induced early in your pregnancy.  It is your first pregnancy.  Your cervix is not ready. What are some risks associated with labor induction? Some risks associated with labor induction include:  Changes  in fetal heart rate, such as being too high, too low, or irregular (erratic).  Failed induction.  Infection in the mother or the baby.  Increased risk of having a cesarean delivery.  Fetal death.  Breaking off (abruption) of the placenta from the uterus (rare).  Rupture of the uterus (very rare). When induction is needed for medical reasons, the benefits of induction generally outweigh the risks. What are some reasons for not inducing labor? Labor induction should not be done if:  Your baby does not tolerate contractions.  You have had previous surgeries on your uterus, such as a myomectomy, removal of fibroids, or a vertical scar from a previous cesarean delivery.  Your placenta lies very low in your uterus and blocks the opening of the cervix (placenta previa).  Your baby is not in a head-down position.  The umbilical cord drops down into the birth canal in front of the baby.  There are unusual circumstances, such as the baby being very early (premature).  You have had more than 2 previous cesarean deliveries. Summary  Labor induction is when steps are taken to cause a pregnant woman to begin the labor process.  Labor induction causes a pregnant woman's uterus to contract. It also causes the cervix to ripen, dilate, and efface.  Labor is not induced before 39 weeks of pregnancy unless there is a medical reason to do so.  When induction is needed for medical reasons, the benefits of induction generally outweigh the risks. This information is not intended to replace advice given to you by your health care provider. Make sure you discuss any questions you have with your health care provider. Document Released: 12/17/2006 Document Revised: 09/10/2016 Document Reviewed: 09/10/2016 Elsevier Interactive Patient Education  2019 [employer]  

## 2018-10-14 NOTE — Progress Notes (Signed)
Pt had u/s on 10/13/2018- recommend induction at 40 weeks. Pt would like cervical exam today.

## 2018-10-15 ENCOUNTER — Other Ambulatory Visit: Payer: Self-pay | Admitting: Advanced Practice Midwife

## 2018-10-18 ENCOUNTER — Encounter (HOSPITAL_COMMUNITY): Payer: Self-pay | Admitting: *Deleted

## 2018-10-18 ENCOUNTER — Telehealth (HOSPITAL_COMMUNITY): Payer: Self-pay | Admitting: *Deleted

## 2018-10-18 NOTE — Telephone Encounter (Signed)
Preadmission screen  

## 2018-10-20 ENCOUNTER — Inpatient Hospital Stay (HOSPITAL_COMMUNITY)
Admission: AD | Admit: 2018-10-20 | Discharge: 2018-10-23 | DRG: 807 | Disposition: A | Discharge status: Home / Self Care | Payer: Medicaid Other | Attending: Obstetrics and Gynecology | Admitting: Obstetrics and Gynecology

## 2018-10-20 ENCOUNTER — Other Ambulatory Visit (HOSPITAL_COMMUNITY): Payer: Self-pay | Admitting: *Deleted

## 2018-10-20 DIAGNOSIS — Z87891 Personal history of nicotine dependence: Secondary | ICD-10-CM | POA: Diagnosis not present

## 2018-10-20 DIAGNOSIS — O403XX0 Polyhydramnios, third trimester, not applicable or unspecified: Secondary | ICD-10-CM | POA: Diagnosis present

## 2018-10-20 DIAGNOSIS — B951 Streptococcus, group B, as the cause of diseases classified elsewhere: Secondary | ICD-10-CM | POA: Diagnosis present

## 2018-10-20 DIAGNOSIS — Z3A40 40 weeks gestation of pregnancy: Secondary | ICD-10-CM

## 2018-10-20 DIAGNOSIS — O480 Post-term pregnancy: Secondary | ICD-10-CM | POA: Diagnosis not present

## 2018-10-20 DIAGNOSIS — Z202 Contact with and (suspected) exposure to infections with a predominantly sexual mode of transmission: Secondary | ICD-10-CM | POA: Diagnosis present

## 2018-10-20 DIAGNOSIS — O98819 Other maternal infectious and parasitic diseases complicating pregnancy, unspecified trimester: Secondary | ICD-10-CM

## 2018-10-20 DIAGNOSIS — Z3480 Encounter for supervision of other normal pregnancy, unspecified trimester: Secondary | ICD-10-CM

## 2018-10-20 DIAGNOSIS — O99210 Obesity complicating pregnancy, unspecified trimester: Secondary | ICD-10-CM

## 2018-10-20 DIAGNOSIS — O09899 Supervision of other high risk pregnancies, unspecified trimester: Secondary | ICD-10-CM

## 2018-10-20 DIAGNOSIS — O3663X0 Maternal care for excessive fetal growth, third trimester, not applicable or unspecified: Secondary | ICD-10-CM | POA: Diagnosis present

## 2018-10-20 DIAGNOSIS — O99214 Obesity complicating childbirth: Secondary | ICD-10-CM | POA: Diagnosis present

## 2018-10-20 DIAGNOSIS — O43893 Other placental disorders, third trimester: Secondary | ICD-10-CM | POA: Diagnosis not present

## 2018-10-20 DIAGNOSIS — Z2839 Other underimmunization status: Secondary | ICD-10-CM

## 2018-10-20 DIAGNOSIS — O24429 Gestational diabetes mellitus in childbirth, unspecified control: Secondary | ICD-10-CM | POA: Diagnosis present

## 2018-10-20 DIAGNOSIS — O99824 Streptococcus B carrier state complicating childbirth: Secondary | ICD-10-CM | POA: Diagnosis present

## 2018-10-20 DIAGNOSIS — Z283 Underimmunization status: Secondary | ICD-10-CM

## 2018-10-20 DIAGNOSIS — O9989 Other specified diseases and conditions complicating pregnancy, childbirth and the puerperium: Secondary | ICD-10-CM

## 2018-10-20 LAB — POCT FERN TEST: POCT Fern Test: POSITIVE

## 2018-10-21 ENCOUNTER — Inpatient Hospital Stay (HOSPITAL_COMMUNITY): Payer: Medicaid Other | Admitting: Anesthesiology

## 2018-10-21 ENCOUNTER — Encounter (HOSPITAL_COMMUNITY): Payer: Medicaid Other

## 2018-10-21 ENCOUNTER — Encounter (HOSPITAL_COMMUNITY): Payer: Self-pay

## 2018-10-21 ENCOUNTER — Inpatient Hospital Stay (HOSPITAL_COMMUNITY): Payer: Medicaid Other

## 2018-10-21 ENCOUNTER — Other Ambulatory Visit: Payer: Self-pay

## 2018-10-21 DIAGNOSIS — Z3A40 40 weeks gestation of pregnancy: Secondary | ICD-10-CM

## 2018-10-21 DIAGNOSIS — O480 Post-term pregnancy: Secondary | ICD-10-CM

## 2018-10-21 DIAGNOSIS — O403XX0 Polyhydramnios, third trimester, not applicable or unspecified: Secondary | ICD-10-CM

## 2018-10-21 DIAGNOSIS — O99824 Streptococcus B carrier state complicating childbirth: Secondary | ICD-10-CM

## 2018-10-21 HISTORY — DX: Polyhydramnios, third trimester, not applicable or unspecified: O40.3XX0

## 2018-10-21 LAB — TYPE AND SCREEN
ABO/RH(D): O POS
Antibody Screen: NEGATIVE

## 2018-10-21 LAB — CBC
HCT: 37.4 % (ref 36.0–46.0)
Hemoglobin: 12 g/dL (ref 12.0–15.0)
MCH: 28 pg (ref 26.0–34.0)
MCHC: 32.1 g/dL (ref 30.0–36.0)
MCV: 87.2 fL (ref 80.0–100.0)
PLATELETS: 188 10*3/uL (ref 150–400)
RBC: 4.29 MIL/uL (ref 3.87–5.11)
RDW: 13.7 % (ref 11.5–15.5)
WBC: 11.4 10*3/uL — ABNORMAL HIGH (ref 4.0–10.5)
nRBC: 0 % (ref 0.0–0.2)

## 2018-10-21 LAB — RPR: RPR Ser Ql: NONREACTIVE

## 2018-10-21 LAB — ABO/RH: ABO/RH(D): O POS

## 2018-10-21 MED ORDER — ONDANSETRON HCL 4 MG/2ML IJ SOLN: 4.0000 mg | INTRAMUSCULAR | Status: DC | PRN

## 2018-10-21 MED ORDER — ACETAMINOPHEN 325 MG PO TABS
650.0000 mg | ORAL_TABLET | ORAL | Status: DC | PRN
  Administered 2018-10-23: 650 mg via ORAL
  Filled 2018-10-21: qty 2

## 2018-10-21 MED ORDER — WITCH HAZEL-GLYCERIN EX PADS: 1.0000 "application " | MEDICATED_PAD | CUTANEOUS | Status: DC | PRN

## 2018-10-21 MED ORDER — LACTATED RINGERS IV SOLN
500.0000 mL | INTRAVENOUS | Status: DC | PRN
  Administered 2018-10-21: 500 mL via INTRAVENOUS

## 2018-10-21 MED ORDER — OXYTOCIN 40 UNITS IN NORMAL SALINE INFUSION - SIMPLE MED: 2.5000 [IU]/h | INTRAVENOUS | Status: DC

## 2018-10-21 MED ORDER — ZOLPIDEM TARTRATE 5 MG PO TABS: 5.0000 mg | ORAL_TABLET | Freq: Every evening | ORAL | Status: DC | PRN

## 2018-10-21 MED ORDER — SIMETHICONE 80 MG PO CHEW: 80.0000 mg | CHEWABLE_TABLET | ORAL | Status: DC | PRN

## 2018-10-21 MED ORDER — LIDOCAINE HCL (PF) 1 % IJ SOLN
30.0000 mL | INTRAMUSCULAR | Status: DC | PRN
  Filled 2018-10-21: qty 30

## 2018-10-21 MED ORDER — COCONUT OIL OIL: 1.0000 "application " | TOPICAL_OIL | Status: DC | PRN

## 2018-10-21 MED ORDER — FENTANYL CITRATE (PF) 100 MCG/2ML IJ SOLN: 50.0000 ug | INTRAMUSCULAR | Status: DC | PRN

## 2018-10-21 MED ORDER — TETANUS-DIPHTH-ACELL PERTUSSIS 5-2.5-18.5 LF-MCG/0.5 IM SUSP: 0.5000 mL | Freq: Once | INTRAMUSCULAR | Status: DC

## 2018-10-21 MED ORDER — SOD CITRATE-CITRIC ACID 500-334 MG/5ML PO SOLN: 30.0000 mL | ORAL | Status: DC | PRN

## 2018-10-21 MED ORDER — OXYTOCIN BOLUS FROM INFUSION
500.0000 mL | Freq: Once | INTRAVENOUS | Status: AC
  Administered 2018-10-21: 500 mL via INTRAVENOUS

## 2018-10-21 MED ORDER — DIPHENHYDRAMINE HCL 25 MG PO CAPS: 25.0000 mg | ORAL_CAPSULE | Freq: Four times a day (QID) | ORAL | Status: DC | PRN

## 2018-10-21 MED ORDER — OXYCODONE-ACETAMINOPHEN 5-325 MG PO TABS: 2.0000 | ORAL_TABLET | ORAL | Status: DC | PRN

## 2018-10-21 MED ORDER — DIBUCAINE 1 % RE OINT: 1.0000 "application " | TOPICAL_OINTMENT | RECTAL | Status: DC | PRN

## 2018-10-21 MED ORDER — BENZOCAINE-MENTHOL 20-0.5 % EX AERO: 1.0000 "application " | INHALATION_SPRAY | CUTANEOUS | Status: DC | PRN

## 2018-10-21 MED ORDER — SENNOSIDES-DOCUSATE SODIUM 8.6-50 MG PO TABS
2.0000 | ORAL_TABLET | ORAL | Status: DC
  Administered 2018-10-21 – 2018-10-22 (×2): 2 via ORAL
  Filled 2018-10-21 (×2): qty 2

## 2018-10-21 MED ORDER — OXYTOCIN 40 UNITS IN NORMAL SALINE INFUSION - SIMPLE MED
1.0000 m[IU]/min | INTRAVENOUS | Status: DC
  Administered 2018-10-21: 2 m[IU]/min via INTRAVENOUS
  Filled 2018-10-21: qty 1000

## 2018-10-21 MED ORDER — SODIUM CHLORIDE (PF) 0.9 % IJ SOLN
INTRAMUSCULAR | Status: DC | PRN
  Administered 2018-10-21: 12 mL/h via EPIDURAL

## 2018-10-21 MED ORDER — FLEET ENEMA 7-19 GM/118ML RE ENEM: 1.0000 | ENEMA | Freq: Every day | RECTAL | Status: DC | PRN

## 2018-10-21 MED ORDER — PRENATAL MULTIVITAMIN CH
1.0000 | ORAL_TABLET | Freq: Every day | ORAL | Status: DC
  Administered 2018-10-21 – 2018-10-23 (×3): 1 via ORAL
  Filled 2018-10-21 (×3): qty 1

## 2018-10-21 MED ORDER — FENTANYL-BUPIVACAINE-NACL 0.5-0.125-0.9 MG/250ML-% EP SOLN
EPIDURAL | Status: AC
  Filled 2018-10-21: qty 250

## 2018-10-21 MED ORDER — LACTATED RINGERS IV SOLN
INTRAVENOUS | Status: DC
  Administered 2018-10-21: via INTRAVENOUS

## 2018-10-21 MED ORDER — ACETAMINOPHEN 325 MG PO TABS
650.0000 mg | ORAL_TABLET | ORAL | Status: DC | PRN
  Administered 2018-10-21: 650 mg via ORAL
  Filled 2018-10-21: qty 2

## 2018-10-21 MED ORDER — LIDOCAINE HCL (PF) 1 % IJ SOLN
INTRAMUSCULAR | Status: DC | PRN
  Administered 2018-10-21: 11 mL via EPIDURAL

## 2018-10-21 MED ORDER — PHENYLEPHRINE 40 MCG/ML (10ML) SYRINGE FOR IV PUSH (FOR BLOOD PRESSURE SUPPORT)
PREFILLED_SYRINGE | INTRAVENOUS | Status: AC
  Filled 2018-10-21: qty 10

## 2018-10-21 MED ORDER — OXYCODONE-ACETAMINOPHEN 5-325 MG PO TABS: 1.0000 | ORAL_TABLET | ORAL | Status: DC | PRN

## 2018-10-21 MED ORDER — HYDROXYZINE HCL 50 MG PO TABS: 50.0000 mg | ORAL_TABLET | Freq: Four times a day (QID) | ORAL | Status: DC | PRN

## 2018-10-21 MED ORDER — PENICILLIN G 3 MILLION UNITS IVPB - SIMPLE MED: 3.0000 10*6.[IU] | INTRAVENOUS | Status: DC

## 2018-10-21 MED ORDER — ONDANSETRON HCL 4 MG PO TABS: 4.0000 mg | ORAL_TABLET | ORAL | Status: DC | PRN

## 2018-10-21 MED ORDER — ONDANSETRON HCL 4 MG/2ML IJ SOLN: 4.0000 mg | Freq: Four times a day (QID) | INTRAMUSCULAR | Status: DC | PRN

## 2018-10-21 MED ORDER — IBUPROFEN 600 MG PO TABS
600.0000 mg | ORAL_TABLET | Freq: Four times a day (QID) | ORAL | Status: DC
  Administered 2018-10-21 – 2018-10-23 (×9): 600 mg via ORAL
  Filled 2018-10-21 (×9): qty 1

## 2018-10-21 MED ORDER — ERYTHROMYCIN 5 MG/GM OP OINT
TOPICAL_OINTMENT | OPHTHALMIC | Status: AC
  Filled 2018-10-21: qty 1

## 2018-10-21 MED ORDER — MISOPROSTOL 25 MCG QUARTER TABLET: 25.0000 ug | ORAL_TABLET | ORAL | Status: DC | PRN

## 2018-10-21 MED ORDER — OXYTOCIN 40 UNITS IN NORMAL SALINE INFUSION - SIMPLE MED: 1.0000 m[IU]/min | INTRAVENOUS | Status: DC

## 2018-10-21 MED ORDER — TERBUTALINE SULFATE 1 MG/ML IJ SOLN: 0.2500 mg | Freq: Once | INTRAMUSCULAR | Status: DC | PRN

## 2018-10-21 MED ORDER — SODIUM CHLORIDE 0.9 % IV SOLN
5.0000 10*6.[IU] | Freq: Once | INTRAVENOUS | Status: AC
  Administered 2018-10-21: 5 10*6.[IU] via INTRAVENOUS
  Filled 2018-10-21: qty 5

## 2018-10-21 NOTE — Anesthesia Preprocedure Evaluation (Signed)
Anesthesia Evaluation  Patient identified by MRN, date of birth, ID band Patient awake    Reviewed: Allergy & Precautions, H&P , NPO status , Patient's Chart, lab work & pertinent test results  Airway Mallampati: II  TM Distance: >3 FB Neck ROM: Full    Dental no notable dental hx.    Pulmonary pneumonia, resolved, former smoker,    Pulmonary exam normal breath sounds clear to auscultation       Cardiovascular negative cardio ROS Normal cardiovascular exam+ dysrhythmias  Rhythm:Regular Rate:Normal     Neuro/Psych negative neurological ROS  negative psych ROS   GI/Hepatic negative GI ROS, Neg liver ROS,   Endo/Other  negative endocrine ROS  Renal/GU negative Renal ROS     Musculoskeletal negative musculoskeletal ROS (+)   Abdominal (+) + obese,   Peds  Hematology negative hematology ROS (+)   Anesthesia Other Findings   Reproductive/Obstetrics (+) Pregnancy                             Anesthesia Physical  Anesthesia Plan  ASA: III  Anesthesia Plan: Epidural   Post-op Pain Management:    Induction:   PONV Risk Score and Plan:   Airway Management Planned:   Additional Equipment:   Intra-op Plan:   Post-operative Plan:   Informed Consent: I have reviewed the patients History and Physical, chart, labs and discussed the procedure including the risks, benefits and alternatives for the proposed anesthesia with the patient or authorized representative who has indicated his/her understanding and acceptance.       Plan Discussed with:   Anesthesia Plan Comments:         Anesthesia Quick Evaluation

## 2018-10-21 NOTE — H&P (Addendum)
OBSTETRIC ADMISSION HISTORY AND PHYSICAL  Jodi Terrell is a 23 y.o. female [phone] with IUP at [redacted]w[redacted]d presenting for IOL due to LGA 3977g @39wk  90%. She also has polyhydramnios with AFI of 28, BMI 44, GBS+. She had ROM before arrival @ 2330hrs. She reports +FMs. No LOF, VB, blurry vision, headaches, peripheral edema, or RUQ pain. She plans on breast feeding. She requests Nexplanon for birth control.  Dating: By 6wk USS --->  Estimated Date of Delivery: 10/20/18  Sono:    @[redacted]w[redacted]d , CWD, normal anatomy, cephalic presentation, 3977g, 94%RDE  Prenatal History/Complications: Polyhydramnios in 3rd trimester  Rubella non immune  GBS positive   Past Medical History: Past Medical History:  Diagnosis Date  . Arrhythmia    with pneumonia  . Heart murmur    as a child  . Pneumonia    09/01/14- 3- 4 years ago  . Seasonal allergies     Past Surgical History: Past Surgical History:  Procedure Laterality Date  . CLOSED REDUCTION NASAL FRACTURE N/A 09/01/2014   Procedure: CLOSED REDUCTION NASAL FRACTURE;  Surgeon: Glenna Fellows, MD;  Location: [facility] OR;  Service: Plastics;  Laterality: N/A;  . TYMPANOSTOMY TUBE PLACEMENT      Obstetrical History: OB History    Gravida  4   Para  1   Term  1   Preterm      AB  2   Living  1     SAB  2   TAB      Ectopic      Multiple      Live Births  1           Social History: Social History   Socioeconomic History  . Marital status: Single    Spouse name: Not on file  . Number of children: Not on file  . Years of education: Not on file  . Highest education level: Not on file  Occupational History  . Not on file  Social Needs  . Financial resource strain: Not hard at all  . Food insecurity:    Worry: Never true    Inability: Never true  . Transportation needs:    Medical: No    Non-medical: Not on file  Tobacco Use  . Smoking status: Former Smoker    Types: Cigarettes    Last attempt to quit: 11/02/2010    Years  since quitting: 7.9  . Smokeless tobacco: Never Used  Substance and Sexual Activity  . Alcohol use: No  . Drug use: No  . Sexual activity: Yes    Partners: Male    Birth control/protection: None    Comment: pregnant  Lifestyle  . Physical activity:    Days per week: Not on file    Minutes per session: Not on file  . Stress: Not at all  Relationships  . Social connections:    Talks on phone: Not on file    Gets together: Not on file    Attends religious service: Not on file    Active member of club or organization: Not on file    Attends meetings of clubs or organizations: Not on file    Relationship status: Not on file  Other Topics Concern  . Not on file  Social History Narrative  . Not on file    Family History: Family History  Problem Relation Age of Onset  . Diabetes Maternal Grandfather   . Cancer Paternal Grandmother   . Diabetes Paternal Grandfather  Allergies: No Known Allergies  Medications Prior to Admission  Medication Sig Dispense Refill Last Dose  . Prenatal Vit-Fe Fumarate-FA (PRENATAL MULTIVITAMIN) TABS tablet Take 1 tablet by mouth daily at 12 noon.   Taking     Review of Systems   All systems reviewed and negative except as stated in HPI  Blood pressure 139/90, pulse 99, temperature 97.9 F (36.6 C), temperature source Oral, resp. rate 16, last menstrual period 01/07/2018, SpO2 100 %. General appearance: alert, cooperative and no distress Lungs: regular rate and effort Heart: regular rate  Abdomen: soft, non-tender Extremities: Homans sign is negative, no sign of DVT Presentation: cephalic  Cervical exam: 3/60%/-2 Cephalic, ruptured, no cord felt  Fetal monitoringBaseline: 140 bpm, Variability: Good {> 6 bpm), Accelerations: Reactive and Decelerations: Absent Uterine activity irritable    Prenatal labs: ABO, Rh: O/Positive/-- (08/12 1113) Antibody: Negative (08/12 1113) Rubella: <0.90 (08/12 1113) RPR: Non Reactive (12/31 0946)   HBsAg: Negative (08/12 1113)  HIV: Non Reactive (12/31 0946)  GBS: Positive (02/12 1129)  2 hr GTT 75/94/70 08/11/2018  Nursing Staff Provider  Office Location [facility]  Dating  6 week sono  Language  English Anatomy US   Normal  Flu Vaccine  05/17/2018 Genetic Screen  NIPS: low risks   AFP: neg    TDaP vaccine   09/08/2018 Hgb A1C or  GTT Early A1c 4.9 Third trimester nl 2 hour  Rhogam     LAB RESULTS   Feeding Plan Breast Blood Type O/Positive/-- (08/12 1113)   Contraception nexplanon Antibody Negative (08/12 1113)  Circumcision Boy  Rubella <0.90 (08/12 1113)  [facility] RPR Non Reactive (12/31 0946)   Support Person FOB HBsAg Negative (08/12 1113)   Prenatal Classes  HIV Non Reactive (12/31 0946)  BTL Consent  GBS Positive (02/12 1129)(For PCN allergy, check sensitivities)   VBAC Consent  Pap Negative (03/22/2018)    Hgb Electro  AA    CF  negative    SMA  2 copies    Prenatal Transfer Tool  Maternal Diabetes: No Genetic Screening: Normal Maternal Ultrasounds/Referrals: Normal Fetal Ultrasounds or other Referrals:  None Maternal Substance Abuse:  No Significant Maternal Medications:  None Significant Maternal Lab Results: Lab values include: Group B Strep positive  Results for orders placed or performed during the hospital encounter of 10/20/18 (from the past 24 hour(s))  Fern Test   Collection Time: 10/20/18 11:58 PM  Result Value Ref Range   POCT Fern Test Positive = ruptured amniotic membanes     Patient Active Problem List   Diagnosis Date Noted  . Polyhydramnios in singleton pregnancy in third trimester 10/21/2018  . Polyhydramnios in third trimester 10/14/2018  . LGA (large for gestational age) fetus affecting management of mother, third trimester 10/14/2018  . Chlamydia trachomatis infection during pregnancy in third trimester 09/30/2018  . Group B streptococcal infection during pregnancy 09/27/2018  . Exposure to  chlamydia 05/11/2018  . Rubella non-immune status, antepartum 03/30/2018  . Supervision of other normal pregnancy, antepartum 03/22/2018  . Maternal morbid obesity, antepartum (HCC) 11/30/2013    Assessment: Jodi Terrell is a 23 y.o. female [phone] with IUP at 146w1d presenting for IOL due to LGA 3977g  1. Labor: Latent  2. FWB: Cat 1  3. Pain: Mild  4. GBS: Positive   LGA 3977g  90% @ 39wk USS  BMI 44 Polyhydramnios Rubella non immune  GBS positive  SROM @2300hrs    Plan: Admission to L&D  for IOL  Penicillin G 5 million units followed by 3 million units Q4h until delivery  Start Pitocin 2 mu/min to titrate 2x2 Epidural on request  CBC, Type and screen and RPR   Sandi Raveling, MD PGY 1 [facility]  10/21/2018, 12:10 AM   Attestation: I have seen this patient and agree with the resident's documentation. I have examined them separately, and we have discussed the plan of care.  Cristal Deer. Earlene Plater, DO OB/GYN Fellow

## 2018-10-21 NOTE — Anesthesia Postprocedure Evaluation (Signed)
Anesthesia Post Note  Patient: Jodi Terrell  Procedure(s) Performed: AN AD HOC LABOR EPIDURAL     Patient location during evaluation: Mother Baby Anesthesia Type: Epidural Level of consciousness: awake and alert and oriented Pain management: satisfactory to patient Vital Signs Assessment: post-procedure vital signs reviewed and stable Respiratory status: respiratory function stable Cardiovascular status: stable Postop Assessment: no headache, no backache, epidural receding, patient able to bend at knees, no signs of nausea or vomiting and adequate PO intake Anesthetic complications: no    Last Vitals:  Vitals:   10/21/18 0705 10/21/18 0750  BP: 122/70 121/62  Pulse: 89 89  Resp: 16 18  Temp:  36.8 C  SpO2:      Last Pain:  Vitals:   10/21/18 0750  TempSrc: Oral  PainSc:    Pain Goal:                   [name]

## 2018-10-21 NOTE — MAU Note (Signed)
Pt arrived to MAU and told registration([place]) that her water had broke. Pt did not mention at registration desk that she was on her way here for a scheduled induction at 2345. Pt registered and brought back to MAU room at 2342 when pt told nurse that her water broke and was on her way into hospital for the induction. Pt did not given specific time of ROM.  Notified l&d charge of pt accidentally registering in wrong area. Terri Piedra, RN asked to get fetal heart rate and to collect a fern sample then pt can come to labor and delivery room.   Pt ferned +, fetal heart tones obtained (HR 140), and set of vitals.   Pt first BP 139/90. Called and notified Terri Piedra, RN of BP and results. Pt okay to be transported to l&d per Terri Piedra, RN

## 2018-10-21 NOTE — Lactation Note (Signed)
This note was copied from a baby's chart. Lactation Consultation Note  Patient Name: Jodi Terrell YWVPX'T Date: 10/21/2018 Reason for consult: Initial assessment;Other (Comment);Term(DAT (+))  16 hours old FT female who is being exclusively BF by his mother, she's a P2 and experienced BF she was able to BF her first child for 5 months, but had to use a NS due to difficult latch, she had to use a NS with that baby, she experienced infant separation when baby # 1 was in the NICU. This baby is DAT (+) but bilirubins are stable and within normal limits so far. Mom's only concern so far is that baby is having difficulty latching to the right breast, he prefers that left one. She participated in the [facility] the [facility] and doesn't have a pump at home, her RN gave her a hand pump. Mom is also familiar with hand expression and able to get colostrum when doing it.  Offered assistance with latch but mom politely declined stating that baby just finished nursing; noticed that baby was very spitty and gaggy, LC had to use the bulb syringe a few times.  Asked mom to call for assistance when needed. She states that feeding at the breast are comfortable and that she can hear baby swallowing when at the breast. Discussed cluster feeding, feeding cues and normal newborn behavior.   Feeding plan:  1. Encouraged mom to feed baby STS 8-12 times/24 hours or sooner if feeding cues are present 2. Hand expression and spoon feeding was strongly encouraged  BF brochure, BF resources and feeding diary were reviewed. Parents reported all questions and concerns were answered; they're both aware of LC services and will call PRN.  Maternal Data Formula Feeding for Exclusion: No Has patient been taught Hand Expression?: Yes Does the patient have breastfeeding experience prior to this delivery?: Yes  Feeding Feeding Type: Breast Fed  Interventions Interventions: Breast feeding basics reviewed;Hand pump  Lactation  Tools Discussed/Used Tools: Pump Breast pump type: Manual WIC Program: Yes Pump Review: Setup, frequency, and cleaning Initiated by:: RN Date initiated:: 10/21/18   Consult Status Consult Status: Follow-up Date: 10/22/18 Follow-up type: In-patient    Jai Bear Venetia Constable 10/21/2018, 8:30 PM

## 2018-10-21 NOTE — Anesthesia Procedure Notes (Signed)
Epidural Patient location during procedure: OB Start time: 10/21/2018 2:32 AM End time: 10/21/2018 2:52 AM  Staffing Anesthesiologist: Lowella Curb, MD Performed: anesthesiologist   Preanesthetic Checklist Completed: patient identified, site marked, surgical consent, pre-op evaluation, timeout performed, IV checked, risks and benefits discussed and monitors and equipment checked  Epidural Patient position: sitting Prep: ChloraPrep Patient monitoring: heart rate, cardiac monitor, continuous pulse ox and blood pressure Approach: midline Location: L2-L3 Injection technique: LOR saline  Needle:  Needle type: Tuohy  Needle gauge: 17 G Needle length: 9 cm Needle insertion depth: 8 cm Catheter type: closed end flexible Catheter size: 20 Guage Catheter at skin depth: 13 cm Test dose: negative  Assessment Events: blood not aspirated, injection not painful, no injection resistance, negative IV test and no paresthesia  Additional Notes Reason for block:procedure for pain

## 2018-10-22 LAB — CBC
HCT: 34.5 % — ABNORMAL LOW (ref 36.0–46.0)
Hemoglobin: 11.3 g/dL — ABNORMAL LOW (ref 12.0–15.0)
MCH: 28.5 pg (ref 26.0–34.0)
MCHC: 32.8 g/dL (ref 30.0–36.0)
MCV: 87.1 fL (ref 80.0–100.0)
Platelets: 167 10*3/uL (ref 150–400)
RBC: 3.96 MIL/uL (ref 3.87–5.11)
RDW: 13.8 % (ref 11.5–15.5)
WBC: 13.3 10*3/uL — ABNORMAL HIGH (ref 4.0–10.5)
nRBC: 0 % (ref 0.0–0.2)

## 2018-10-22 NOTE — Progress Notes (Addendum)
POSTPARTUM PROGRESS NOTE  Post Partum Day 1  Subjective:  Jodi Terrell is a 23 y.o. [id number] s/p SVD at [redacted]w[redacted]d.  She reports she is doing well. No acute events overnight. She denies any problems with ambulating, voiding or po intake. Denies nausea or vomiting.  Pain is well controlled.  Lochia is mild.  Objective: Blood pressure (!) 92/55, pulse 66, temperature 98.5 F (36.9 C), temperature source Oral, resp. rate 18, height 5\' 1"  (1.549 m), weight 104.3 kg, last menstrual period 01/07/2018, SpO2 100 %, unknown if currently breastfeeding.  Physical Exam:  General: alert, cooperative and no distress Chest: no respiratory distress Heart:regular rate, distal pulses intact Abdomen: soft, nontender,  Uterine Fundus: firm, appropriately tender DVT Evaluation: No calf swelling or tenderness Extremities: no LE edema Skin: warm, dry  Recent Labs    10/21/18 0021 10/22/18 0651  HGB 12.0 11.3*  HCT 37.4 34.5*    Assessment/Plan: Jodi Terrell is a 23 y.o. [id number] s/p NSVD at [redacted]w[redacted]d   PPD#1 - Doing well  Routine postpartum care One elevated BP overnight to 139/84, otherwise BP range 92-120/60-80. Will continue to monitor at this time. Contraception: Nexplanon Feeding: Breast Dispo: Given baby needs to be monitored for 48 hours for inadequate treatment of GBS, will Plan for discharge tomorrow and monitor BP's.   LOS: 2 days   Jodi Terrell, D.O. [facility], PGY1 10/22/2018, 8:00 AM

## 2018-10-22 NOTE — Discharge Summary (Addendum)
Obstetrics Discharge Summary OB/GYN Faculty Practice   Patient Name: Jodi Terrell DOB: 1996/01/03 MRN: [id number]  Date of admission: 10/20/2018 Delivering MD: Glenice Bow   Date of discharge: 10/23/2018  Admitting diagnosis: pregnancy Intrauterine pregnancy: [redacted]w[redacted]d    Secondary diagnosis:   Principal Problem:   LGA (large for gestational age) fetus affecting management of mother, third trimester Active Problems:   Maternal morbid obesity, antepartum ([place]   Rubella non-immune status, antepartum   Exposure to chlamydia   Group B streptococcal infection during pregnancy   Polyhydramnios in third trimester   Polyhydramnios in singleton pregnancy in third trimester   Discharge diagnosis: Term Pregnancy Delivered                                            Postpartum procedures: None  Complications: None  Outpatient Follow-Up: None  Hospital course: MREALITY DEJONGEis a 23y.o. 467w1dho was admitted for SOL w/ h/o LGA, polyhydramnios without diagnosis of gestational diabetes. Her pregnancy was complicated by same. Her labor course was notable for arrival in early labor with SROM, augmentation with pitocin. Delivery was complicated by precipitous delivery. Please see delivery/op note for additional details. Her postpartum course was uncomplicated. She was breastfeeding without difficulty though having some discomfort so met with lactation consultant frequently during admission. By day of discharge, she was passing flatus, urinating, eating and drinking without difficulty. Her pain was well-controlled, and she was discharged home with Ibuprofen 60044mShe will follow-up in clinic in 4-6 weeks. Patient was rubella non-immune. She received the MMR vaccine prior to discharge.  Physical exam  Vitals:   10/21/18 2020 10/22/18 0511 10/22/18 1437 10/23/18 0500  BP: 139/84 (!) 92/55 117/81 112/75  Pulse: 79 66 87 84  Resp: 18 18 20 18   Temp: 98.6 F (37 C) 98.5 F (36.9 C) 97.9  F (36.6 C) 98 F (36.7 C)  TempSrc: Oral Oral Oral Oral  SpO2: 100% 100% 99% 99%  Weight:      Height:       General: Well appearing, no acute distress, lying comfortably with baby at side Lochia: appropriate Uterine Fundus: firm Incision: N/A DVT Evaluation: No evidence of DVT seen on physical exam. No cords or calf tenderness. No significant calf/ankle edema. Labs: Lab Results  Component Value Date   WBC 13.3 (H) 10/22/2018   HGB 11.3 (L) 10/22/2018   HCT 34.5 (L) 10/22/2018   MCV 87.1 10/22/2018   PLT 167 10/22/2018   CMP Latest Ref Rng & Units 03/22/2018  Glucose 65 - 99 mg/dL 88  BUN 6 - 20 mg/dL 4(L)  Creatinine 0.57 - 1.00 mg/dL 0.42(L)  Sodium 134 - 144 mmol/L 139  Potassium 3.5 - 5.2 mmol/L 3.7  Chloride 96 - 106 mmol/L 106  CO2 20 - 29 mmol/L 19(L)  Calcium 8.7 - 10.2 mg/dL 9.1  Total Protein 6.0 - 8.5 g/dL 6.4  Total Bilirubin 0.0 - 1.2 mg/dL 0.5  Alkaline Phos 39 - 117 IU/L 69  AST 0 - 40 IU/L 14  ALT 0 - 32 IU/L 9    Discharge instructions: Per After Visit Summary and "Baby and Me Booklet"  After visit meds:  Allergies as of 10/23/2018   No Known Allergies     Medication List    TAKE these medications   ibuprofen 600 MG tablet Commonly known as:  ADVIL,MOTRIN  Take 1 tablet (600 mg total) by mouth every 6 (six) hours.   prenatal multivitamin Tabs tablet Take 1 tablet by mouth daily at 12 noon.   senna-docusate 8.6-50 MG tablet Commonly known as:  Senokot-S Take 2 tablets by mouth daily. Start taking on:  October 24, 2018       Postpartum contraception: Nexplanon Diet: Routine Diet Activity: Advance as tolerated. Pelvic rest for 6 weeks.   Follow-up Appt: Future Appointments  Date Time Provider [place]  11/18/2018 10:30 AM Woodroe Mode, MD [place] None   Follow-up Visit:No follow-ups on file.  Please schedule this patient for Postpartum visit in: 4 weeks with the following provider: Any provider Low risk pregnancy  complicated by: LGA, polyhydramnios Delivery mode:  SVD Anticipated Birth Control:  Nexplanon PP Procedures needed: none  Schedule Integrated BH visit: no  Newborn Data: Live born female  Birth Weight: 8 lb 7.5 oz (3841 g) APGAR: 7, 9  Newborn Delivery   Birth date/time:  10/21/2018 03:56:00 Delivery type:  Vaginal, Spontaneous    Baby Feeding: Breast Disposition:home with mother  Mina Marble, [place], PGY1  I confirm that I have verified the information documented in the resident's note and that I have also personally reperformed the history, physical exam and all medical decision making activities of this service and have verified that all service and findings are accurately documented in this student's note.   Wende Mott, [place] 10/23/2018 12:54 PM     Attestation of Attending Supervision of Advanced Practitioner (PA/CNM/NP): Evaluation and management procedures were performed by the Advanced Practitioner under my supervision and collaboration.  I have reviewed the Advanced Practitioner's note and chart, and I agree with the management and plan.  Feliz Beam, M.D. Attending [facility] [employer] (Faculty Practice)  10/23/2018 3:13 PM

## 2018-10-22 NOTE — Lactation Note (Signed)
This note was copied from a baby's chart. Lactation Consultation Note  Patient Name: Jodi Terrell XENMM'H Date: 10/22/2018 Reason for consult: Follow-up assessment;Nipple pain/trauma;Term Mom called out for assist with sore nipples.  She states feedings are uncomfortable on both breasts.  She is using cross cradle and football hold.  Baby is currently on right breast in cross cradle.  Latch could be deeper so baby taken off breast.  Mom shown how to compress breast tissue for more depth.  Baby latched easily with good flanged lips.  Mom still uncomfortable.  Nipple round when baby came off. Nipples intact with small blister noted on right.  Discussed resting nipples and pumping for a few feeds.  Mom agreeable.  Symphony pump set up with instructions.  If milk is obtained she will call for syringe or spoon.  Maternal Data    Feeding Feeding Type: Breast Fed  LATCH Score Latch: Grasps breast easily, tongue down, lips flanged, rhythmical sucking.  Audible Swallowing: A few with stimulation  Type of Nipple: Everted at rest and after stimulation  Comfort (Breast/Nipple): Filling, red/small blisters or bruises, mild/mod discomfort  Hold (Positioning): No assistance needed to correctly position infant at breast.  LATCH Score: 8  Interventions Interventions: Assisted with latch;Breast compression;Skin to skin;Adjust position;Breast massage;Support pillows;DEBP  Lactation Tools Discussed/Used WIC Program: Yes Pump Review: Setup, frequency, and cleaning;Milk Storage Initiated by:: Lmoulden Date initiated:: 10/22/18   Consult Status Consult Status: Follow-up Date: 10/23/18 Follow-up type: In-patient    Huston Foley 10/22/2018, 2:25 PM

## 2018-10-23 MED ORDER — MEASLES, MUMPS & RUBELLA VAC IJ SOLR
0.5000 mL | Freq: Once | INTRAMUSCULAR | Status: AC
  Administered 2018-10-23: 0.5 mL via SUBCUTANEOUS
  Filled 2018-10-23: qty 0.5

## 2018-10-23 MED ORDER — IBUPROFEN 600 MG PO TABS: 600.0000 mg | ORAL_TABLET | Freq: Four times a day (QID) | ORAL | 0 refills | Status: DC

## 2018-10-23 MED ORDER — SENNOSIDES-DOCUSATE SODIUM 8.6-50 MG PO TABS: 2.0000 | ORAL_TABLET | ORAL | 0 refills | Status: DC

## 2018-10-23 NOTE — Discharge Instructions (Signed)
Vaginal Delivery, Care After °Refer to this sheet in the next few weeks. These instructions provide you with information about caring for yourself after vaginal delivery. Your health care provider may also give you more specific instructions. Your treatment has been planned according to current medical practices, but problems sometimes occur. Call your health care provider if you have any problems or questions. °What can I expect after the procedure? °After vaginal delivery, it is common to have: °· Some bleeding from your vagina. °· Soreness in your abdomen, your vagina, and the area of skin between your vaginal opening and your anus (perineum). °· Pelvic cramps. °· Fatigue. °Follow these instructions at home: °Medicines °· Take over-the-counter and prescription medicines only as told by your health care provider. °· If you were prescribed an antibiotic medicine, take it as told by your health care provider. Do not stop taking the antibiotic until it is finished. °Driving ° °· Do not drive or operate heavy machinery while taking prescription pain medicine. °· Do not drive for 24 hours if you received a sedative. °Lifestyle °· Do not drink alcohol. This is especially important if you are breastfeeding or taking medicine to relieve pain. °· Do not use tobacco products, including cigarettes, chewing tobacco, or e-cigarettes. If you need help quitting, ask your health care provider. °Eating and drinking °· Drink at least 8 eight-ounce glasses of water every day unless you are told not to by your health care provider. If you choose to breastfeed your baby, you may need to drink more water than this. °· Eat high-fiber foods every day. These foods may help prevent or relieve constipation. High-fiber foods include: °? Whole grain cereals and breads. °? Brown rice. °? Beans. °? Fresh fruits and vegetables. °Activity °· Return to your normal activities as told by your health care provider. Ask your health care provider what  activities are safe for you. °· Rest as much as possible. Try to rest or take a nap when your baby is sleeping. °· Do not lift anything that is heavier than your baby or 10 lb (4.5 kg) until your health care provider says that it is safe. °· Talk with your health care provider about when you can engage in sexual activity. This may depend on your: °? Risk of infection. °? Rate of healing. °? Comfort and desire to engage in sexual activity. °Vaginal Care °· If you have an episiotomy or a vaginal tear, check the area every day for signs of infection. Check for: °? More redness, swelling, or pain. °? More fluid or blood. °? Warmth. °? Pus or a bad smell. °· Do not use tampons or douches until your health care provider says this is safe. °· Watch for any blood clots that may pass from your vagina. These may look like clumps of dark red, brown, or black discharge. °General instructions °· Keep your perineum clean and dry as told by your health care provider. °· Wear loose, comfortable clothing. °· Wipe from front to back when you use the toilet. °· Ask your health care provider if you can shower or take a bath. If you had an episiotomy or a perineal tear during labor and delivery, your health care provider may tell you not to take baths for a certain length of time. °· Wear a bra that supports your breasts and fits you well. °· If possible, have someone help you with household activities and help care for your baby for at least a few days after you   leave the hospital. °· Keep all follow-up visits for you and your baby as told by your health care provider. This is important. °Contact a health care provider if: °· You have: °? Vaginal discharge that has a bad smell. °? Difficulty urinating. °? Pain when urinating. °? A sudden increase or decrease in the frequency of your bowel movements. °? More redness, swelling, or pain around your episiotomy or vaginal tear. °? More fluid or blood coming from your episiotomy or vaginal  tear. °? Pus or a bad smell coming from your episiotomy or vaginal tear. °? A fever. °? A rash. °? Little or no interest in activities you used to enjoy. °? Questions about caring for yourself or your baby. °· Your episiotomy or vaginal tear feels warm to the touch. °· Your episiotomy or vaginal tear is separating or does not appear to be healing. °· Your breasts are painful, hard, or turn red. °· You feel unusually sad or worried. °· You feel nauseous or you vomit. °· You pass large blood clots from your vagina. If you pass a blood clot from your vagina, save it to show to your health care provider. Do not flush blood clots down the toilet without having your health care provider look at them. °· You urinate more than usual. °· You are dizzy or light-headed. °· You have not breastfed at all and you have not had a menstrual period for 12 weeks after delivery. °· You have stopped breastfeeding and you have not had a menstrual period for 12 weeks after you stopped breastfeeding. °Get help right away if: °· You have: °? Pain that does not go away or does not get better with medicine. °? Chest pain. °? Difficulty breathing. °? Blurred vision or spots in your vision. °? Thoughts about hurting yourself or your baby. °· You develop pain in your abdomen or in one of your legs. °· You develop a severe headache. °· You faint. °· You bleed from your vagina so much that you fill two sanitary pads in one hour. °This information is not intended to replace advice given to you by your health care provider. Make sure you discuss any questions you have with your health care provider. °Document Released: 07/25/2000 Document Revised: 01/09/2016 Document Reviewed: 08/12/2015 °Elsevier Interactive Patient Education © 2019 Elsevier Inc. ° °

## 2018-10-23 NOTE — Lactation Note (Signed)
This note was copied from a baby's chart. Lactation Consultation Note; Mom reports nipples are sore. Nipples pink with scabs and blister noted . Reports she has given formula by bottle because they were so sore. Has pumped 4 times since pump was set up for her. Last pumping at 6 am. Using NS to assist with pain. Reports she used it with her last baby. States it helps somewhat. Has coconut oil. Offered comfort gels and mom accepted. Reviewed use of them.  No questions at present. Baby asleep on mom's chest. Encouraged to call for assist when baby wakes for next feeding. Reviewed our phone number, OP appointments and BFGS as resources for support after DC. To call prn Patient Name: Jodi Terrell LDJTT'S Date: 10/23/2018 Reason for consult: Follow-up assessment   Maternal Data Formula Feeding for Exclusion: No Has patient been taught Hand Expression?: Yes Does the patient have breastfeeding experience prior to this delivery?: Yes  Feeding LATCH Score                   Interventions    Lactation Tools Discussed/Used WIC Program: Yes   Consult Status Consult Status: Complete    Pamelia Hoit 10/23/2018, 8:21 AM

## 2018-10-24 ENCOUNTER — Ambulatory Visit: Payer: Self-pay

## 2018-10-24 NOTE — Lactation Note (Signed)
This note was copied from a baby's chart. Lactation Consultation Note  Patient Name: Jodi Terrell VOZDG'U Date: 10/24/2018 Reason for consult: Follow-up assessment;Term;Nipple pain/trauma  P2 mother whose infant is now 73 hours old.  Mother's feeding choice on admission was breast/bottle.    Mother has sore nipples and has been formula feeding.  She is interested in continuing to pump until her nipples feel less sore.  She is pumping every 2-3 hours and is using EBM and coconut oil for comfort.  She also has comfort gels.  Mother has been given a NS and used a NS with her first baby for the same reason.  Baby was sleeping on mother's chest so I did not assess her nipples at this time.  Engorgement prevention/treatment reviewed.  Mother has 2 manual pumps for home use but does not have a DEBP.  She is planning on going to the [facility] office this week.  I offered the [facility] loaner pump to her but she has declined at this time.  Stressed the importance of obtaining a DEBP for milk supply and breast stimulation.  Mother will call me if she decides to obtain a [facility] loaner.  She has our OP phone number for questions/concerns after discharge.  Father present.     Maternal Data Formula Feeding for Exclusion: Yes Reason for exclusion: Mother's choice to formula and breast feed on admission Has patient been taught Hand Expression?: Yes Does the patient have breastfeeding experience prior to this delivery?: Yes  Feeding    LATCH Score                   Interventions    Lactation Tools Discussed/Used WIC Program: Yes   Consult Status Consult Status: Complete Date: 10/24/18 Follow-up type: Call as needed    Ocie Tino R Harsha Yusko 10/24/2018, 8:51 AM

## 2018-11-18 ENCOUNTER — Ambulatory Visit: Payer: Medicaid Other | Admitting: Obstetrics & Gynecology

## 2018-12-01 ENCOUNTER — Ambulatory Visit: Payer: Medicaid Other | Admitting: Obstetrics & Gynecology

## 2018-12-07 ENCOUNTER — Encounter: Payer: Self-pay | Admitting: Obstetrics & Gynecology

## 2018-12-07 ENCOUNTER — Ambulatory Visit (INDEPENDENT_AMBULATORY_CARE_PROVIDER_SITE_OTHER): Payer: Medicaid Other | Admitting: Obstetrics & Gynecology

## 2018-12-07 ENCOUNTER — Other Ambulatory Visit: Payer: Self-pay

## 2018-12-07 DIAGNOSIS — Z1389 Encounter for screening for other disorder: Secondary | ICD-10-CM | POA: Diagnosis not present

## 2018-12-07 DIAGNOSIS — Z3202 Encounter for pregnancy test, result negative: Secondary | ICD-10-CM | POA: Diagnosis not present

## 2018-12-07 LAB — POCT URINE PREGNANCY: Preg Test, Ur: NEGATIVE

## 2018-12-07 NOTE — Patient Instructions (Signed)
Etonogestrel implant  What is this medicine?  ETONOGESTREL (et oh noe JES trel) is a contraceptive (birth control) device. It is used to prevent pregnancy. It can be used for up to 3 years.  This medicine may be used for other purposes; ask your health care provider or pharmacist if you have questions.  COMMON BRAND NAME(S): Implanon, Nexplanon  What should I tell my health care provider before I take this medicine?  They need to know if you have any of these conditions:  -abnormal vaginal bleeding  -blood vessel disease or blood clots  -breast, cervical, endometrial, ovarian, liver, or uterine cancer  -diabetes  -gallbladder disease  -heart disease or recent heart attack  -high blood pressure  -high cholesterol or triglycerides  -kidney disease  -liver disease  -migraine headaches  -seizures  -stroke  -tobacco smoker  -an unusual or allergic reaction to etonogestrel, anesthetics or antiseptics, other medicines, foods, dyes, or preservatives  -pregnant or trying to get pregnant  -breast-feeding  How should I use this medicine?  This device is inserted just under the skin on the inner side of your upper arm by a health care professional.  Talk to your pediatrician regarding the use of this medicine in children. Special care may be needed.  Overdosage: If you think you have taken too much of this medicine contact a poison control center or emergency room at once.  NOTE: This medicine is only for you. Do not share this medicine with others.  What if I miss a dose?  This does not apply.  What may interact with this medicine?  Do not take this medicine with any of the following medications:  -amprenavir  -fosamprenavir  This medicine may also interact with the following medications:  -acitretin  -aprepitant  -armodafinil  -bexarotene  -bosentan  -carbamazepine  -certain medicines for fungal infections like fluconazole, ketoconazole, itraconazole and voriconazole  -certain medicines to treat hepatitis, HIV or  AIDS  -cyclosporine  -felbamate  -griseofulvin  -lamotrigine  -modafinil  -oxcarbazepine  -phenobarbital  -phenytoin  -primidone  -rifabutin  -rifampin  -rifapentine  -St. John's wort  -topiramate  This list may not describe all possible interactions. Give your health care provider a list of all the medicines, herbs, non-prescription drugs, or dietary supplements you use. Also tell them if you smoke, drink alcohol, or use illegal drugs. Some items may interact with your medicine.  What should I watch for while using this medicine?  This product does not protect you against HIV infection (AIDS) or other sexually transmitted diseases.  You should be able to feel the implant by pressing your fingertips over the skin where it was inserted. Contact your doctor if you cannot feel the implant, and use a non-hormonal birth control method (such as condoms) until your doctor confirms that the implant is in place. Contact your doctor if you think that the implant may have broken or become bent while in your arm.  You will receive a user card from your health care provider after the implant is inserted. The card is a record of the location of the implant in your upper arm and when it should be removed. Keep this card with your health records.  What side effects may I notice from receiving this medicine?  Side effects that you should report to your doctor or health care professional as soon as possible:  -allergic reactions like skin rash, itching or hives, swelling of the face, lips, or tongue  -breast lumps, breast tissue   changes, or discharge  -breathing problems  -changes in emotions or moods  -if you feel that the implant may have broken or bent while in your arm  -high blood pressure  -pain, irritation, swelling, or bruising at the insertion site  -scar at site of insertion  -signs of infection at the insertion site such as fever, and skin redness, pain or discharge  -signs and symptoms of a blood clot such as breathing  problems; changes in vision; chest pain; severe, sudden headache; pain, swelling, warmth in the leg; trouble speaking; sudden numbness or weakness of the face, arm or leg  -signs and symptoms of liver injury like dark yellow or brown urine; general ill feeling or flu-like symptoms; light-colored stools; loss of appetite; nausea; right upper belly pain; unusually weak or tired; yellowing of the eyes or skin  -unusual vaginal bleeding, discharge  Side effects that usually do not require medical attention (report to your doctor or health care professional if they continue or are bothersome):  -acne  -breast pain or tenderness  -headache  -irregular menstrual bleeding  -nausea  This list may not describe all possible side effects. Call your doctor for medical advice about side effects. You may report side effects to FDA at 1-800-FDA-1088.  Where should I keep my medicine?  This drug is given in a hospital or clinic and will not be stored at home.  NOTE: This sheet is a summary. It may not cover all possible information. If you have questions about this medicine, talk to your doctor, pharmacist, or health care provider.   2019 Elsevier/Gold Standard (2017-06-16 14:11:42)

## 2018-12-07 NOTE — Progress Notes (Signed)
..  Post Partum Exam  Jodi Terrell is a 23 y.o. [phone] female who presents for a postpartum visit. She is 6 weeks postpartum following a spontaneous vaginal delivery. I have fully reviewed the prenatal and intrapartum course. The delivery was at 39.0 gestational weeks.  Anesthesia: epidural. Postpartum course has been good. Baby's course has been good. Baby is feeding by bottle Rush Barer. Bleeding no bleeding. Bowel function is normal. Bladder function is normal. Patient is sexually active. Contraception method is none. Postpartum depression screening:neg  The following portions of the patient's history were reviewed and updated as appropriate: allergies, current medications, past family history, past medical history, past social history, past surgical history and problem list. Last pap smear done 03/2018 and was Normal  Review of Systems Pertinent items are noted in HPI.    Objective:  unknown if currently breastfeeding.  General:  alert, cooperative and no distress           Abdomen: obese, not distended   Vulva:  not evaluated  Vagina: not evaluated                    Assessment:    normal postpartum exam. Pap smear not done at today's visit.   Plan:   1. Contraception: Nexplanon 2. Need to abstain for 2 weeks and RTC for insertion 3. Follow up in: 2 weeks or as needed.    Adam Phenix, MD 12/07/2018

## 2018-12-21 ENCOUNTER — Ambulatory Visit (INDEPENDENT_AMBULATORY_CARE_PROVIDER_SITE_OTHER): Payer: Medicaid Other | Admitting: Obstetrics & Gynecology

## 2018-12-21 ENCOUNTER — Encounter: Payer: Self-pay | Admitting: Obstetrics & Gynecology

## 2018-12-21 ENCOUNTER — Other Ambulatory Visit: Payer: Self-pay

## 2018-12-21 VITALS — BP 104/69 | HR 73 | Ht 61.0 in | Wt 225.0 lb

## 2018-12-21 DIAGNOSIS — Z3202 Encounter for pregnancy test, result negative: Secondary | ICD-10-CM | POA: Diagnosis not present

## 2018-12-21 DIAGNOSIS — Z30017 Encounter for initial prescription of implantable subdermal contraceptive: Secondary | ICD-10-CM

## 2018-12-21 LAB — POCT URINE PREGNANCY: Preg Test, Ur: NEGATIVE

## 2018-12-21 MED ORDER — ETONOGESTREL 68 MG ~~LOC~~ IMPL
68.0000 mg | DRUG_IMPLANT | Freq: Once | SUBCUTANEOUS | Status: AC
  Administered 2018-12-21: 68 mg via SUBCUTANEOUS

## 2018-12-21 NOTE — Progress Notes (Addendum)
Presented for Nexplanon Insert.  UPT done today is NEGATIVE.  Administrations This Visit    etonogestrel (NEXPLANON) implant 68 mg    Admin Date 12/21/2018 Action Given Dose 68 mg Route Subdermal Administered By Maretta Bees, RMA

## 2018-12-21 NOTE — Patient Instructions (Signed)
Etonogestrel implant  What is this medicine?  ETONOGESTREL (et oh noe JES trel) is a contraceptive (birth control) device. It is used to prevent pregnancy. It can be used for up to 3 years.  This medicine may be used for other purposes; ask your health care provider or pharmacist if you have questions.  COMMON BRAND NAME(S): Implanon, Nexplanon  What should I tell my health care provider before I take this medicine?  They need to know if you have any of these conditions:  -abnormal vaginal bleeding  -blood vessel disease or blood clots  -breast, cervical, endometrial, ovarian, liver, or uterine cancer  -diabetes  -gallbladder disease  -heart disease or recent heart attack  -high blood pressure  -high cholesterol or triglycerides  -kidney disease  -liver disease  -migraine headaches  -seizures  -stroke  -tobacco smoker  -an unusual or allergic reaction to etonogestrel, anesthetics or antiseptics, other medicines, foods, dyes, or preservatives  -pregnant or trying to get pregnant  -breast-feeding  How should I use this medicine?  This device is inserted just under the skin on the inner side of your upper arm by a health care professional.  Talk to your pediatrician regarding the use of this medicine in children. Special care may be needed.  Overdosage: If you think you have taken too much of this medicine contact a poison control center or emergency room at once.  NOTE: This medicine is only for you. Do not share this medicine with others.  What if I miss a dose?  This does not apply.  What may interact with this medicine?  Do not take this medicine with any of the following medications:  -amprenavir  -fosamprenavir  This medicine may also interact with the following medications:  -acitretin  -aprepitant  -armodafinil  -bexarotene  -bosentan  -carbamazepine  -certain medicines for fungal infections like fluconazole, ketoconazole, itraconazole and voriconazole  -certain medicines to treat hepatitis, HIV or  AIDS  -cyclosporine  -felbamate  -griseofulvin  -lamotrigine  -modafinil  -oxcarbazepine  -phenobarbital  -phenytoin  -primidone  -rifabutin  -rifampin  -rifapentine  -St. John's wort  -topiramate  This list may not describe all possible interactions. Give your health care provider a list of all the medicines, herbs, non-prescription drugs, or dietary supplements you use. Also tell them if you smoke, drink alcohol, or use illegal drugs. Some items may interact with your medicine.  What should I watch for while using this medicine?  This product does not protect you against HIV infection (AIDS) or other sexually transmitted diseases.  You should be able to feel the implant by pressing your fingertips over the skin where it was inserted. Contact your doctor if you cannot feel the implant, and use a non-hormonal birth control method (such as condoms) until your doctor confirms that the implant is in place. Contact your doctor if you think that the implant may have broken or become bent while in your arm.  You will receive a user card from your health care provider after the implant is inserted. The card is a record of the location of the implant in your upper arm and when it should be removed. Keep this card with your health records.  What side effects may I notice from receiving this medicine?  Side effects that you should report to your doctor or health care professional as soon as possible:  -allergic reactions like skin rash, itching or hives, swelling of the face, lips, or tongue  -breast lumps, breast tissue   changes, or discharge  -breathing problems  -changes in emotions or moods  -if you feel that the implant may have broken or bent while in your arm  -high blood pressure  -pain, irritation, swelling, or bruising at the insertion site  -scar at site of insertion  -signs of infection at the insertion site such as fever, and skin redness, pain or discharge  -signs and symptoms of a blood clot such as breathing  problems; changes in vision; chest pain; severe, sudden headache; pain, swelling, warmth in the leg; trouble speaking; sudden numbness or weakness of the face, arm or leg  -signs and symptoms of liver injury like dark yellow or brown urine; general ill feeling or flu-like symptoms; light-colored stools; loss of appetite; nausea; right upper belly pain; unusually weak or tired; yellowing of the eyes or skin  -unusual vaginal bleeding, discharge  Side effects that usually do not require medical attention (report to your doctor or health care professional if they continue or are bothersome):  -acne  -breast pain or tenderness  -headache  -irregular menstrual bleeding  -nausea  This list may not describe all possible side effects. Call your doctor for medical advice about side effects. You may report side effects to FDA at 1-800-FDA-1088.  Where should I keep my medicine?  This drug is given in a hospital or clinic and will not be stored at home.  NOTE: This sheet is a summary. It may not cover all possible information. If you have questions about this medicine, talk to your doctor, pharmacist, or health care provider.   2019 Elsevier/Gold Standard (2017-06-16 14:11:42)

## 2018-12-21 NOTE — Progress Notes (Signed)
GYNECOLOGY OFFICE PROCEDURE NOTE  KENTAVIA HOGENSON is a 23 y.o. [phone] here for Nexplanon insertion.  Last pap smear was on 03/2018 and was normal.  No other gynecologic concerns.  Nexplanon Insertion Procedure Patient identified, informed consent performed, consent signed.   Patient does understand that irregular bleeding is a very common side effect of this medication. She was advised to have backup contraception for one week after placement. Pregnancy test in clinic today was negative.  Appropriate time out taken.  Patient's left arm was prepped and draped in the usual sterile fashion. The ruler used to measure and mark insertion area.  Patient was prepped with alcohol swab and then injected with 3 ml of 1% lidocaine.  She was prepped with betadine, Nexplanon removed from packaging,  Device confirmed in needle, then inserted full length of needle and withdrawn per handbook instructions. Nexplanon was able to palpated in the patient's arm; patient palpated the insert herself. There was minimal blood loss.  Patient insertion site covered with gauze and a pressure bandage to reduce any bruising.  The patient tolerated the procedure well and was given post procedure instructions.    Adam Phenix, MD Attending Obstetrician & Gynecologist,  [facility]  12/21/2018   Patient ID: LETASHA CASTELO, female   DOB: [birth year], 22 y.o.   MRN: [id number]

## 2019-06-19 ENCOUNTER — Emergency Department (HOSPITAL_COMMUNITY)
Admission: EM | Admit: 2019-06-19 | Discharge: 2019-06-20 | Disposition: A | Discharge status: Home / Self Care | Payer: Medicaid Other | Attending: Emergency Medicine | Admitting: Emergency Medicine

## 2019-06-19 ENCOUNTER — Encounter (HOSPITAL_COMMUNITY): Payer: Self-pay | Admitting: Emergency Medicine

## 2019-06-19 ENCOUNTER — Other Ambulatory Visit: Payer: Self-pay

## 2019-06-19 DIAGNOSIS — R197 Diarrhea, unspecified: Secondary | ICD-10-CM | POA: Insufficient documentation

## 2019-06-19 DIAGNOSIS — Z0289 Encounter for other administrative examinations: Secondary | ICD-10-CM

## 2019-06-19 DIAGNOSIS — R112 Nausea with vomiting, unspecified: Secondary | ICD-10-CM | POA: Diagnosis not present

## 2019-06-19 DIAGNOSIS — Z20828 Contact with and (suspected) exposure to other viral communicable diseases: Secondary | ICD-10-CM | POA: Diagnosis not present

## 2019-06-19 LAB — URINALYSIS, ROUTINE W REFLEX MICROSCOPIC
Bilirubin Urine: NEGATIVE
Glucose, UA: NEGATIVE mg/dL
Ketones, ur: NEGATIVE mg/dL
Nitrite: NEGATIVE
Protein, ur: 30 mg/dL — AB
Specific Gravity, Urine: 1.032 — ABNORMAL HIGH (ref 1.005–1.030)
Squamous Epithelial / HPF: >50 — ABNORMAL HIGH (ref 0–5)
pH: 5 (ref 5.0–8.0)

## 2019-06-19 LAB — COMPREHENSIVE METABOLIC PANEL
ALT: 17 U/L (ref 0–44)
AST: 18 U/L (ref 15–41)
Albumin: 4.2 g/dL (ref 3.5–5.0)
Alkaline Phosphatase: 99 U/L (ref 38–126)
Anion gap: 10 (ref 5–15)
BUN: 14 mg/dL (ref 6–20)
CO2: 24 mmol/L (ref 22–32)
Calcium: 9 mg/dL (ref 8.9–10.3)
Chloride: 102 mmol/L (ref 98–111)
Creatinine, Ser: 0.64 mg/dL (ref 0.44–1.00)
GFR calc Af Amer: >60 mL/min (ref >60)
GFR calc non Af Amer: >60 mL/min (ref >60)
Glucose, Bld: 96 mg/dL (ref 70–99)
Potassium: 4.1 mmol/L (ref 3.5–5.1)
Sodium: 136 mmol/L (ref 135–145)
Total Bilirubin: 0.7 mg/dL (ref 0.3–1.2)
Total Protein: 8 g/dL (ref 6.5–8.1)

## 2019-06-19 LAB — CBC
HCT: 45.5 % (ref 36.0–46.0)
Hemoglobin: 14.9 g/dL (ref 12.0–15.0)
MCH: 27.9 pg (ref 26.0–34.0)
MCHC: 32.7 g/dL (ref 30.0–36.0)
MCV: 85.2 fL (ref 80.0–100.0)
Platelets: 423 10*3/uL — ABNORMAL HIGH (ref 150–400)
RBC: 5.34 MIL/uL — ABNORMAL HIGH (ref 3.87–5.11)
RDW: 12.9 % (ref 11.5–15.5)
WBC: 14.9 10*3/uL — ABNORMAL HIGH (ref 4.0–10.5)
nRBC: 0 % (ref 0.0–0.2)

## 2019-06-19 LAB — POC URINE PREG, ED: Preg Test, Ur: NEGATIVE

## 2019-06-19 LAB — LIPASE, BLOOD: Lipase: 17 U/L (ref 11–51)

## 2019-06-19 NOTE — ED Triage Notes (Signed)
Pt states that she has N/V/D x 3 days. Pt states she was able to keep food down today, state "I basically just need a work note to go back to work."

## 2019-06-20 LAB — SARS CORONAVIRUS 2 (TAT 6-24 HRS): SARS Coronavirus 2: NEGATIVE

## 2019-06-20 NOTE — Discharge Instructions (Addendum)
You have been diagnosed today with encounter to obtain excuse for work and nausea/vomiting/diarrhea  At this time there does not appear to be the presence of an emergent medical condition, however there is always the potential for conditions to change. Please read and follow the below instructions.  Please return to the Emergency Department immediately for any new or worsening symptoms. Please be sure to follow up with your Primary Care Provider within one week regarding your visit today; please call their office to schedule an appointment even if you are feeling better for a follow-up visit. You have been tested for the COVID-19 virus today, your test results will be available on your MyChart account in 1-2 days.  Please continue to self quarantine until symptom-free x7 days despite testing results as a false negative test is a possibility.  Please be sure to drink plenty of water and get plenty of rest over the next few days to avoid dehydration and call your primary care doctor tomorrow to schedule a follow-up appointment.  Return to the ER immediately for any new or worsening symptoms.  Get help right away if: You have pain in your chest, neck, arm, or jaw. You feel very weak or you pass out (faint). You throw up again and again. You have throw up that is bright red or looks like black coffee grounds. You have bloody or black poop (stools) or poop that looks like tar. You have a very bad headache, a stiff neck, or both. You have very bad pain, cramping, or bloating in your belly (abdomen). You have trouble breathing. You are breathing very quickly. Your heart is beating very quickly. Your skin feels cold and clammy. You feel confused. You have signs of losing too much water in your body, such as: Dark pee, very little pee, or no pee. Cracked lips. Dry mouth. Sunken eyes. Sleepiness. Weakness. You have any new/concerning or worsening symptoms  Please read the additional information  packets attached to your discharge summary.  Note: Portions of this text may have been transcribed using voice recognition software. Every effort was made to ensure accuracy; however, inadvertent computerized transcription errors may still be present.

## 2019-06-20 NOTE — ED Provider Notes (Signed)
[facility]Y [facility] Provider Note   CSN: [id number] Arrival date & time: 06/19/19  1825     History   Chief Complaint Chief Complaint  Patient presents with  . Emesis    HPI Jodi Terrell is a 23 y.o. female presents today requesting a work note.  Patient reports that on 06/17/2019 she was at work in the morning and developed nausea, nonbloody/nonbilious emesis and nonbloody diarrhea.  She reports that the symptoms continued for 1 day and resolved on Saturday, 06/18/2019.  Patient reports that she has not had any nausea or vomiting or diarrhea since that time and reports that she is feeling well and at baseline.  She reports that her work center in today for a work note.  Patient denies fever/chills,lightheadedness, headache/vision changes, neck pain, chest pain/shortness of breath, recurrence of nausea/vomiting/diarrhea.  She denies abdominal pain, dysuria/hematuria, vaginal bleeding/discharge, rash, fall/injury, cough or any additional concerns today.    HPI  Past Medical History:  Diagnosis Date  . Arrhythmia    with pneumonia  . Heart murmur    as a child  . Pneumonia    09/01/14- 3- 4 years ago  . Seasonal allergies     Patient Active Problem List   Diagnosis Date Noted  . Polyhydramnios in singleton pregnancy in third trimester 10/21/2018  . Polyhydramnios in third trimester 10/14/2018  . LGA (large for gestational age) fetus affecting management of mother, third trimester 10/14/2018  . Chlamydia trachomatis infection during pregnancy in third trimester 09/30/2018  . Group B streptococcal infection during pregnancy 09/27/2018  . Exposure to chlamydia 05/11/2018  . Rubella non-immune status, antepartum 03/30/2018  . Supervision of other normal pregnancy, antepartum 03/22/2018  . Maternal morbid obesity, antepartum ([facility]) 11/30/2013    Past Surgical History:  Procedure Laterality Date  . CLOSED REDUCTION NASAL FRACTURE N/A 09/01/2014   Procedure:  CLOSED REDUCTION NASAL FRACTURE;  Surgeon: Glenna FellowsBrinda Thimmappa, MD;  Location: [facility] OR;  Service: Plastics;  Laterality: N/A;  . TYMPANOSTOMY TUBE PLACEMENT       OB History    Gravida  4   Para  2   Term  2   Preterm      AB  2   Living  2     SAB  2   TAB      Ectopic      Multiple  0   Live Births  2            Home Medications    Prior to Admission medications   Medication Sig Start Date End Date Taking? Authorizing Provider  ibuprofen (ADVIL,MOTRIN) 600 MG tablet Take 1 tablet (600 mg total) by mouth every 6 (six) hours. Patient not taking: Reported on 12/07/2018 10/23/18   Orpah CobbMullis, Kiersten P, DO  Prenatal Vit-Fe Fumarate-FA (PRENATAL MULTIVITAMIN) TABS tablet Take 1 tablet by mouth daily at 12 noon.    [provider]  senna-docusate (SENOKOT-S) 8.6-50 MG tablet Take 2 tablets by mouth daily. Patient not taking: Reported on 12/07/2018 10/24/18   Joana ReamerMullis, Kiersten P, DO    Family History Family History  Problem Relation Age of Onset  . Diabetes Maternal Grandfather   . Cancer Paternal Grandmother   . Diabetes Paternal Grandfather     Social History Social History   Tobacco Use  . Smoking status: Former Smoker    Types: Cigarettes    Quit date: 11/02/2010    Years since quitting: 8.6  . Smokeless tobacco: Never Used  Substance Use  Topics  . Alcohol use: No  . Drug use: No     Allergies   Patient has no known allergies.   Review of Systems Review of Systems Ten systems are reviewed and are negative for acute change except as noted in the HPI   Physical Exam Updated Vital Signs BP 113/83 (BP Location: Right Arm)   Pulse 90   Temp 98 F (36.7 C) (Oral)   Resp 18   Ht 5\' 1"  (1.549 m)   Wt 108.9 kg   SpO2 99%   BMI 45.35 kg/m   Physical Exam Constitutional:      General: She is not in acute distress.    Appearance: Normal appearance. She is well-developed. She is not ill-appearing or diaphoretic.  HENT:     Head: Normocephalic  and atraumatic.     Right Ear: External ear normal.     Left Ear: External ear normal.     Nose: Nose normal.     Mouth/Throat:     Mouth: Mucous membranes are moist.     Pharynx: Oropharynx is clear.  Eyes:     General: Vision grossly intact. Gaze aligned appropriately.     Pupils: Pupils are equal, round, and reactive to light.  Neck:     Musculoskeletal: Normal range of motion.     Trachea: Trachea and phonation normal. No tracheal deviation.  Cardiovascular:     Rate and Rhythm: Normal rate and regular rhythm.     Pulses: Normal pulses.  Pulmonary:     Effort: Pulmonary effort is normal. No respiratory distress.     Breath sounds: Normal breath sounds.  Abdominal:     General: There is no distension.     Palpations: Abdomen is soft.     Tenderness: There is no abdominal tenderness. There is no guarding or rebound.  Musculoskeletal: Normal range of motion.  Skin:    General: Skin is warm and dry.  Neurological:     Mental Status: She is alert.     GCS: GCS eye subscore is 4. GCS verbal subscore is 5. GCS motor subscore is 6.     Comments: Speech is clear and goal oriented, follows commands Major Cranial nerves without deficit, no facial droop Moves extremities without ataxia, coordination intact  Psychiatric:        Behavior: Behavior normal.      ED Treatments / Results  Labs (all labs ordered are listed, but only abnormal results are displayed) Labs Reviewed  CBC - Abnormal; Notable for the following components:      Result Value   WBC 14.9 (*)    RBC 5.34 (*)    Platelets 423 (*)    All other components within normal limits  URINALYSIS, ROUTINE W REFLEX MICROSCOPIC - Abnormal; Notable for the following components:   Color, Urine AMBER (*)    APPearance CLOUDY (*)    Specific Gravity, Urine 1.032 (*)    Hgb urine dipstick LARGE (*)    Protein, ur 30 (*)    Leukocytes,Ua TRACE (*)    Bacteria, UA RARE (*)    Squamous Epithelial / LPF >50 (*)    All other  components within normal limits  SARS CORONAVIRUS 2 (TAT 6-24 HRS)  LIPASE, BLOOD  COMPREHENSIVE METABOLIC PANEL  POC URINE PREG, ED    EKG None  Radiology No results found.  Procedures Procedures (including critical care time)  Medications Ordered in ED Medications - No data to display   Initial Impression /  Assessment and Plan / ED Course  I have reviewed the triage vital signs and the nursing notes.  Pertinent labs & imaging results that were available during my care of the patient were reviewed by me and considered in my medical decision making (see chart for details).    On initial evaluation this 23 year old female is overall very well-appearing and in no acute distress.  She reports that she is feeling completely fine at this time and has no complaints and was only here for a work note.  She reports that she has not had any nausea, vomiting or diarrhea in nearly 24 hours.  She reports that she was surprised when blood work and urine was taken at triage and this is currently in process.  - CBC shows leukocytosis of 14.9 Urine pregnancy negative Lipase within normal limits CMP within normal limits Urinalysis with greater than 50 squamous cells, rare bacteria, WBCs, leukocytes, protein, hemoglobin - Urinalysis is grossly contaminated today, patient denies any urinary symptoms likely this is contamination, do not feel that culture of this grossly contaminated urine is indicated patient will follow up with PCP for recheck next week.  Leukocytosis today is likely secondary to her recent illness which she appears to be recovering well from, she has no abdominal tenderness, pain, no recurrence of her nausea/vomiting/diarrhea, lungs are clear she is overall well-appearing in no acute distress do not feel there is any indication for antibiotics at this time as her symptoms are improved additionally there is no indication for any further imaging or work-up at this time.  Patient is  afebrile, no tachycardia or hypoxia on room air and stable blood pressure.  She would like outpatient Covid test today which I feel is reasonable, she states that she will follow up on her results on her MyChart account in 1-2 days and will continue to self quarantine until she is symptom-free x7 days.  Patient advised to increase water intake to avoid dehydration and follow-up with her PCP this week, I have given her a work note for the rest of the week.  At this time there does not appear to be any evidence of an acute emergency medical condition and the patient appears stable for discharge with appropriate outpatient follow up. Diagnosis was discussed with patient who verbalizes understanding of care plan and is agreeable to discharge. I have discussed return precautions with patient who verbalizes understanding of return precautions. Patient encouraged to follow-up with their PCP. All questions answered.  Patient has been discharged in good condition.  Note: Portions of this report may have been transcribed using voice recognition software. Every effort was made to ensure accuracy; however, inadvertent computerized transcription errors may still be present. Final Clinical Impressions(s) / ED Diagnoses   Final diagnoses:  Nausea vomiting and diarrhea  Encounter to obtain excuse from work    ED Discharge Orders    None       Elizabeth Palau 06/20/19 0102    Vanetta Mulders, MD 06/22/19 [phone]

## 2019-10-17 ENCOUNTER — Ambulatory Visit: Payer: Medicaid Other | Attending: Internal Medicine

## 2019-10-17 DIAGNOSIS — Z23 Encounter for immunization: Secondary | ICD-10-CM | POA: Insufficient documentation

## 2019-10-17 NOTE — Progress Notes (Signed)
   Covid-19 Vaccination Clinic  Name:  Jodi Terrell    MRN: [id number] DOB: [birth year]  10/17/2019  [name] was observed post Covid-19 immunization for 15 minutes without incident. She was provided with Vaccine Information Sheet and instruction to access the V-Safe system.   [name] was instructed to call 911 with any severe reactions post vaccine: Marland Kitchen Difficulty breathing  . Swelling of face and throat  . A fast heartbeat  . A bad rash all over body  . Dizziness and weakness   Immunizations Administered    Name Date Dose VIS Date Route   Pfizer COVID-19 Vaccine 10/17/2019 12:43 PM 0.3 mL 07/22/2019 Intramuscular   Manufacturer: [employer], [employer]   Lot: [id number]   NDC: [address]-4

## 2019-11-16 ENCOUNTER — Ambulatory Visit: Payer: Medicaid Other | Attending: Internal Medicine

## 2019-11-16 DIAGNOSIS — Z23 Encounter for immunization: Secondary | ICD-10-CM

## 2019-11-16 NOTE — Progress Notes (Signed)
   Covid-19 Vaccination Clinic  Name:  Jodi Terrell    MRN: [id number] DOB: June 17, 1996  11/16/2019  [name] was observed post Covid-19 immunization for 15 minutes without incident. She was provided with Vaccine Information Sheet and instruction to access the V-Safe system.   [name] was instructed to call 911 with any severe reactions post vaccine: Marland Kitchen Difficulty breathing  . Swelling of face and throat  . A fast heartbeat  . A bad rash all over body  . Dizziness and weakness   Immunizations Administered    Name Date Dose VIS Date Route   Pfizer COVID-19 Vaccine 11/16/2019  1:06 PM 0.3 mL 07/22/2019 Intramuscular   Manufacturer: [employer], [employer]   Lot: [id number]   NDC: [address]-1

## 2019-11-28 DIAGNOSIS — Z20828 Contact with and (suspected) exposure to other viral communicable diseases: Secondary | ICD-10-CM | POA: Diagnosis not present

## 2019-12-05 DIAGNOSIS — Z20828 Contact with and (suspected) exposure to other viral communicable diseases: Secondary | ICD-10-CM | POA: Diagnosis not present

## 2020-01-11 DIAGNOSIS — Z20828 Contact with and (suspected) exposure to other viral communicable diseases: Secondary | ICD-10-CM | POA: Diagnosis not present

## 2020-01-30 DIAGNOSIS — J029 Acute pharyngitis, unspecified: Secondary | ICD-10-CM | POA: Diagnosis not present

## 2020-01-30 DIAGNOSIS — J020 Streptococcal pharyngitis: Secondary | ICD-10-CM | POA: Diagnosis not present

## 2020-02-02 ENCOUNTER — Encounter (HOSPITAL_COMMUNITY): Payer: Self-pay

## 2020-02-02 ENCOUNTER — Emergency Department (HOSPITAL_COMMUNITY)
Admission: EM | Admit: 2020-02-02 | Discharge: 2020-02-03 | Disposition: A | Discharge status: Home / Self Care | Payer: Medicaid Other | Attending: Emergency Medicine | Admitting: Emergency Medicine

## 2020-02-02 ENCOUNTER — Other Ambulatory Visit: Payer: Self-pay

## 2020-02-02 DIAGNOSIS — Z5321 Procedure and treatment not carried out due to patient leaving prior to being seen by health care provider: Secondary | ICD-10-CM | POA: Diagnosis not present

## 2020-02-02 DIAGNOSIS — Z113 Encounter for screening for infections with a predominantly sexual mode of transmission: Secondary | ICD-10-CM | POA: Insufficient documentation

## 2020-02-02 LAB — URINALYSIS, ROUTINE W REFLEX MICROSCOPIC
Bilirubin Urine: NEGATIVE
Glucose, UA: NEGATIVE mg/dL
Hgb urine dipstick: NEGATIVE
Ketones, ur: NEGATIVE mg/dL
Leukocytes,Ua: NEGATIVE
Nitrite: NEGATIVE
Protein, ur: NEGATIVE mg/dL
Specific Gravity, Urine: 1.026 (ref 1.005–1.030)
pH: 5 (ref 5.0–8.0)

## 2020-02-02 LAB — PREGNANCY, URINE: Preg Test, Ur: NEGATIVE

## 2020-02-02 NOTE — ED Triage Notes (Signed)
Pt here for STD check. Pt endorses exposure, denies symptoms.

## 2020-02-03 ENCOUNTER — Other Ambulatory Visit: Payer: Self-pay

## 2020-02-03 ENCOUNTER — Ambulatory Visit (HOSPITAL_COMMUNITY)
Admission: EM | Admit: 2020-02-03 | Discharge: 2020-02-03 | Disposition: A | Discharge status: Home / Self Care | Payer: Medicaid Other | Attending: Family Medicine | Admitting: Family Medicine

## 2020-02-03 ENCOUNTER — Encounter (HOSPITAL_COMMUNITY): Payer: Self-pay

## 2020-02-03 DIAGNOSIS — Z113 Encounter for screening for infections with a predominantly sexual mode of transmission: Secondary | ICD-10-CM | POA: Insufficient documentation

## 2020-02-03 LAB — HIV ANTIBODY (ROUTINE TESTING W REFLEX): HIV Screen 4th Generation wRfx: NONREACTIVE

## 2020-02-03 LAB — RPR: RPR Ser Ql: NONREACTIVE

## 2020-02-03 NOTE — ED Notes (Signed)
Called pts name for updated VS 3x with no response.  

## 2020-02-03 NOTE — Discharge Instructions (Signed)
Checking you for STDs You can check your mychart for results.

## 2020-02-03 NOTE — ED Provider Notes (Signed)
[place]    CSN: [id number] Arrival date & time: 02/03/20  7412      History   Chief Complaint Chief Complaint  Patient presents with  . Exposure to STD    HPI Jodi Terrell is a 24 y.o. female.   Patient is a 24 year old female who presents today with concerns for STDs.  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  She denies any lesions, rash or discharge.  Reporting the sex was protected with a condom.  No associated dysuria, hematuria urinary frequency.  No abdominal pain. Patient's last menstrual period was 01/10/2020 (exact date).   ROS per HPI      Past Medical History:  Diagnosis Date  . Arrhythmia    with pneumonia  . Heart murmur    as a child  . Pneumonia    09/01/14- 3- 4 years ago  . Seasonal allergies     Patient Active Problem List   Diagnosis Date Noted  . Polyhydramnios in singleton pregnancy in third trimester 10/21/2018  . Polyhydramnios in third trimester 10/14/2018  . LGA (large for gestational age) fetus affecting management of mother, third trimester 10/14/2018  . Chlamydia trachomatis infection during pregnancy in third trimester 09/30/2018  . Group B streptococcal infection during pregnancy 09/27/2018  . Exposure to chlamydia 05/11/2018  . Rubella non-immune status, antepartum 03/30/2018  . Supervision of other normal pregnancy, antepartum 03/22/2018  . Maternal morbid obesity, antepartum ([place]) 11/30/2013    Past Surgical History:  Procedure Laterality Date  . CLOSED REDUCTION NASAL FRACTURE N/A 09/01/2014   Procedure: CLOSED REDUCTION NASAL FRACTURE;  Surgeon: Irene Limbo, MD;  Location: [facility];  Service: Plastics;  Laterality: N/A;  . TYMPANOSTOMY TUBE PLACEMENT      OB History    Gravida  4   Para  2   Term  2   Preterm      AB  2   Living  2     SAB  2   TAB      Ectopic      Multiple  0   Live Births  2            Home Medications    Prior to Admission medications     Medication Sig Start Date End Date Taking? Authorizing Provider  Prenatal Vit-Fe Fumarate-FA (PRENATAL MULTIVITAMIN) TABS tablet Take 1 tablet by mouth daily at 12 noon.    [provider]    Family History Family History  Problem Relation Age of Onset  . Diabetes Maternal Grandfather   . Cancer Paternal Grandmother   . Diabetes Paternal Grandfather     Social History Social History   Tobacco Use  . Smoking status: Former Smoker    Types: Cigarettes    Quit date: 11/02/2010    Years since quitting: 9.2  . Smokeless tobacco: Never Used  Vaping Use  . Vaping Use: Never used  Substance Use Topics  . Alcohol use: No  . Drug use: No     Allergies   Patient has no known allergies.   Review of Systems Review of Systems   Physical Exam Triage Vital Signs ED Triage Vitals [02/03/20 0829]  Enc Vitals Group     BP 126/67     Pulse Rate 80     Resp 16     Temp 98.1 F (36.7 C)     Temp Source Oral     SpO2 100 %  Weight      Height      Head Circumference      Peak Flow      Pain Score 0     Pain Loc      Pain Edu?      Excl. in GC?    No data found.  Updated Vital Signs BP 126/67 (BP Location: Right Arm)   Pulse 80   Temp 98.1 F (36.7 C) (Oral)   Resp 16   LMP 01/10/2020 (Exact Date)   SpO2 100%   Visual Acuity Right Eye Distance:   Left Eye Distance:   Bilateral Distance:    Right Eye Near:   Left Eye Near:    Bilateral Near:     Physical Exam Vitals and nursing note reviewed.  Constitutional:      General: She is not in acute distress.    Appearance: Normal appearance. She is not ill-appearing, toxic-appearing or diaphoretic.  HENT:     Head: Normocephalic.     Nose: Nose normal.  Eyes:     Conjunctiva/sclera: Conjunctivae normal.  Pulmonary:     Effort: Pulmonary effort is normal.  Musculoskeletal:        General: Normal range of motion.     Cervical back: Normal range of motion.  Skin:    General: Skin is warm and  dry.     Findings: No rash.  Neurological:     Mental Status: She is alert.  Psychiatric:        Mood and Affect: Mood normal.      UC Treatments / Results  Labs (all labs ordered are listed, but only abnormal results are displayed) Labs Reviewed  HIV ANTIBODY (ROUTINE TESTING W REFLEX)  RPR  CERVICOVAGINAL ANCILLARY ONLY    EKG   Radiology No results found.  Procedures Procedures (including critical care time)  Medications Ordered in UC Medications - No data to display  Initial Impression / Assessment and Plan / UC Course  I have reviewed the triage vital signs and the nursing notes.  Pertinent labs & imaging results that were available during my care of the patient were reviewed by me and considered in my medical decision making (see chart for details).     STD screening Swab sent for STD screening.  Blood obtained for HIV and syphilis. Check my chart for results.  CheckingFinal Clinical Impressions(s) / UC Diagnoses   Final diagnoses:  Screen for STD (sexually transmitted disease)     Discharge Instructions     Checking you for STDs You can check your mychart for results.     ED Prescriptions    None     PDMP not reviewed this encounter.   Dahlia Byes A, NP 02/03/20 [phone]

## 2020-02-03 NOTE — ED Triage Notes (Signed)
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 1 month ago. Pt denies lesions, new rash or discharge. Pt denies lower abdominal pain. PT denies urinary frequency or burning. Pt requesting STD testing.

## 2020-02-06 ENCOUNTER — Telehealth (HOSPITAL_COMMUNITY): Payer: Self-pay | Admitting: Orthopedic Surgery

## 2020-02-06 LAB — CERVICOVAGINAL ANCILLARY ONLY
Bacterial Vaginitis (gardnerella): POSITIVE — AB
Candida Glabrata: NEGATIVE
Candida Vaginitis: NEGATIVE
Chlamydia: NEGATIVE
Comment: NEGATIVE
Comment: NEGATIVE
Comment: NEGATIVE
Comment: NEGATIVE
Comment: NEGATIVE
Comment: NORMAL
Neisseria Gonorrhea: NEGATIVE
Trichomonas: NEGATIVE

## 2020-02-06 MED ORDER — METRONIDAZOLE 500 MG PO TABS: 500.0000 mg | ORAL_TABLET | Freq: Two times a day (BID) | ORAL | 0 refills | Status: DC

## 2020-02-27 IMAGING — US US OB TRANSVAGINAL
1 series · 15 of 28 positions shown · non-contrast
Comparison: December 14, 2017

ADDENDUM:
The findings portion and conclusion portion of the report should
intrauterine gestation is 6 weeks and 1 day with CRL of 5.3 mm.
CLINICAL DATA: Pelvic pain.

EXAM:
TRANSVAGINAL OB ULTRASOUND
TECHNIQUE: Transvaginal ultrasound was performed for complete evaluation of the
gestation as well as the maternal uterus, adnexal regions, and
pelvic cul-de-sac.

[Series 1: us ob transvaginal · 15 of 30 slices shown]
[im 1/30]
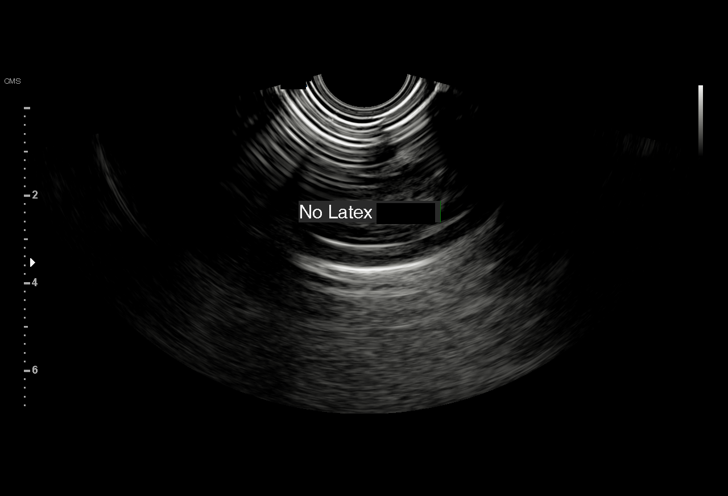
[im 3/30]
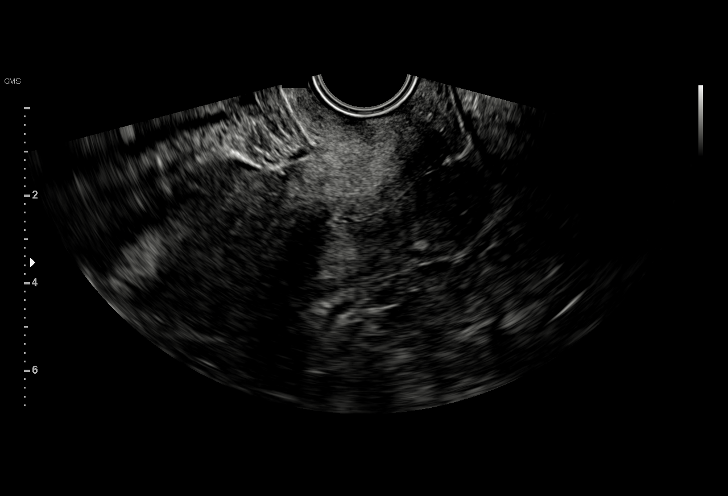
[im 5/30]
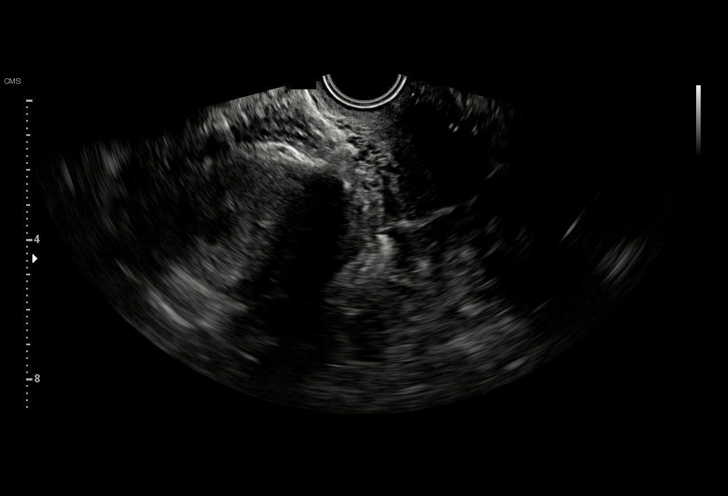
[im 7/30]
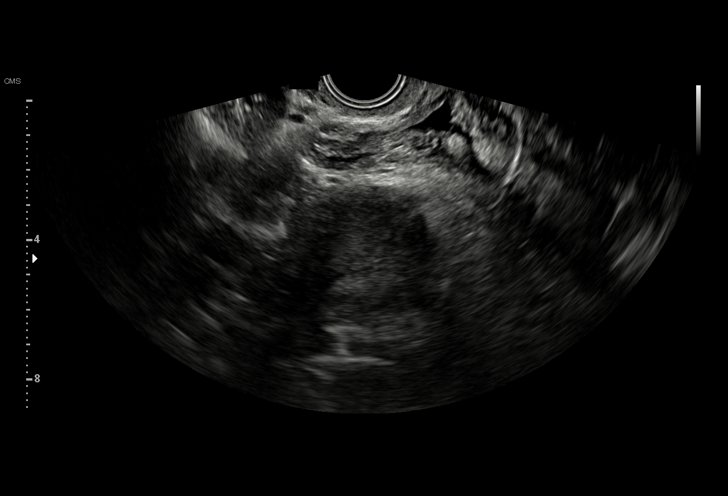
[im 9/30]
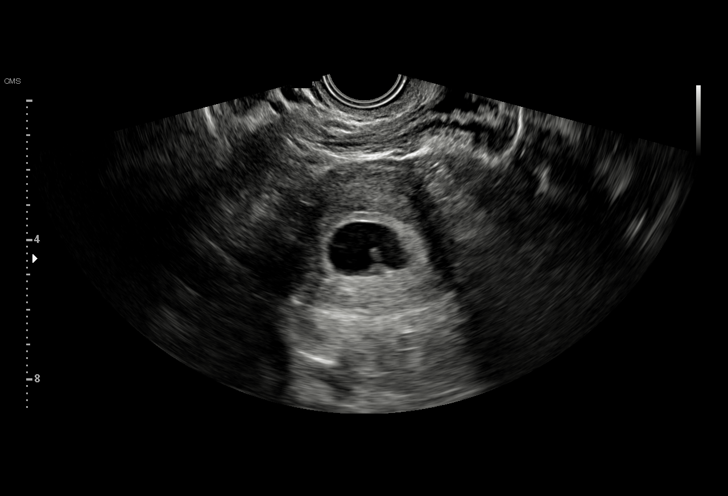
[im 11/30]
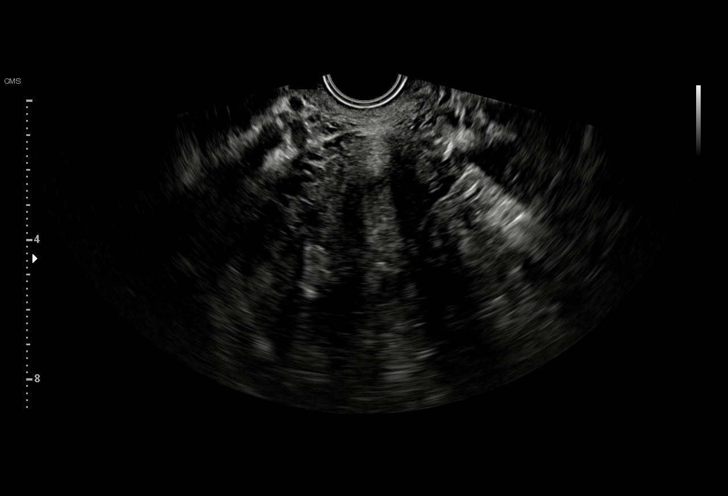
[im 13/30]
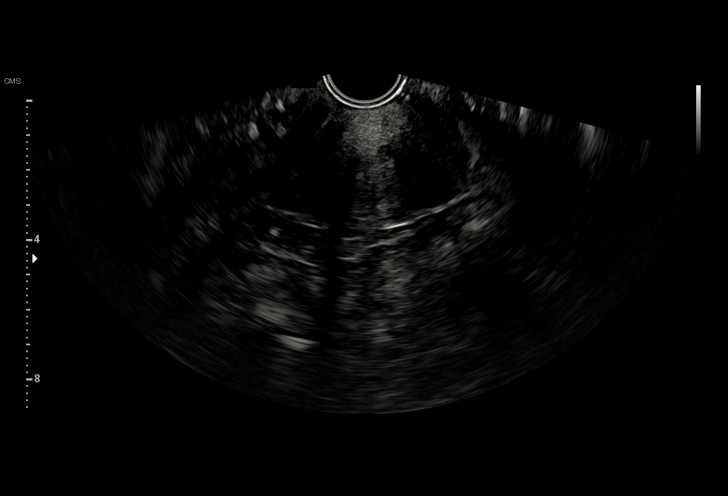
[im 16/30]
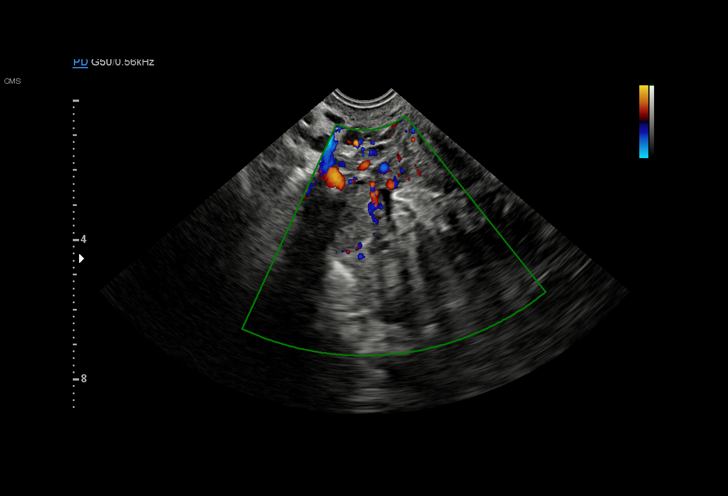
[im 17/30]
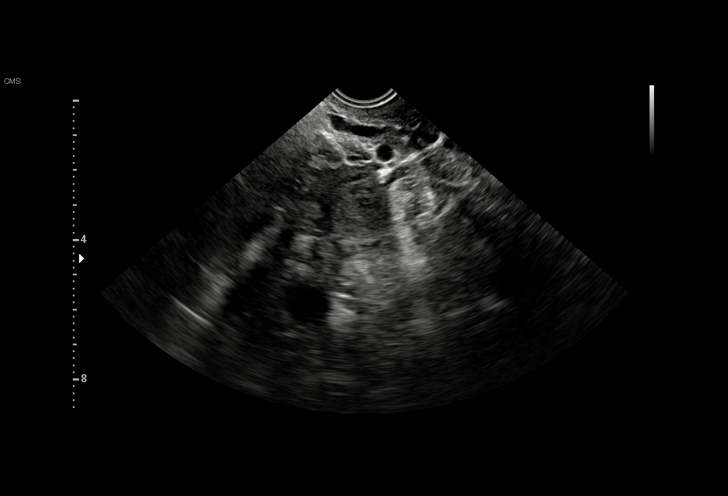
[im 19/30]
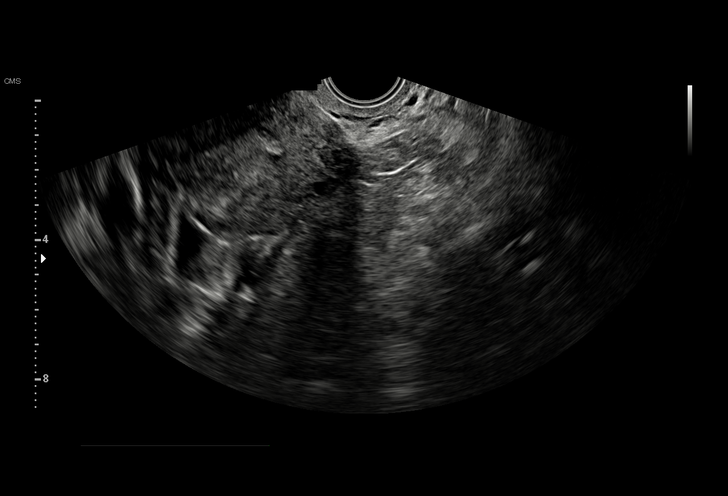
[im 21/30]
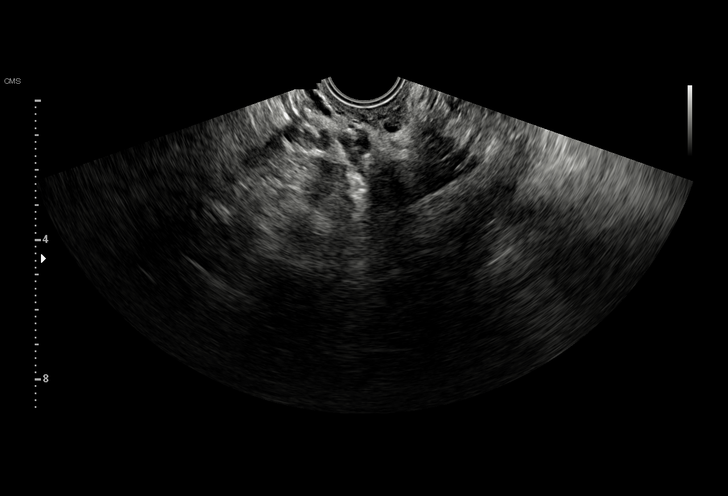
[im 23/30]
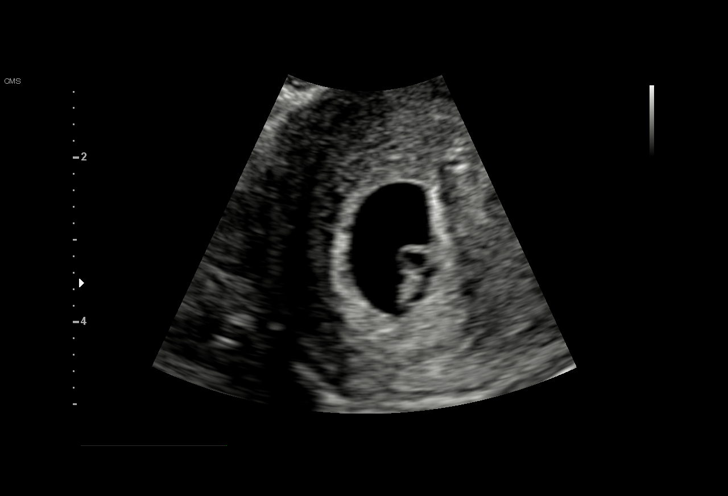
[im 25/30]
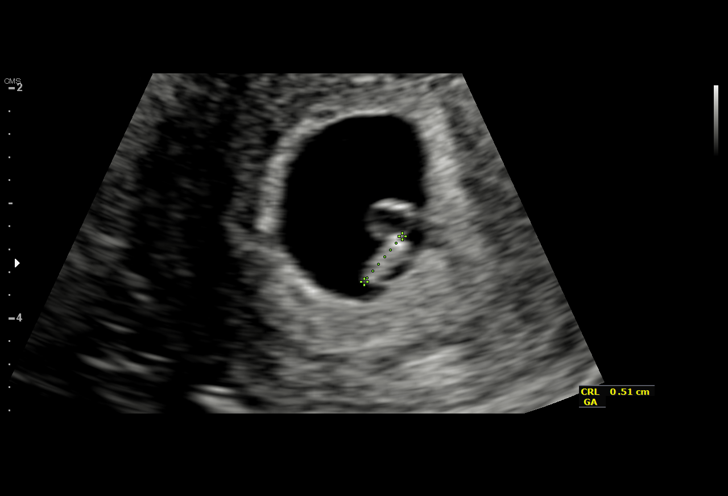
[im 27/30]
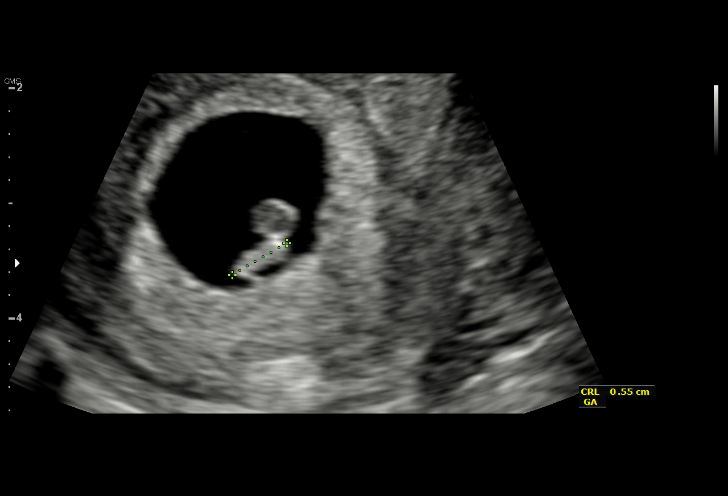
[im 30/30]
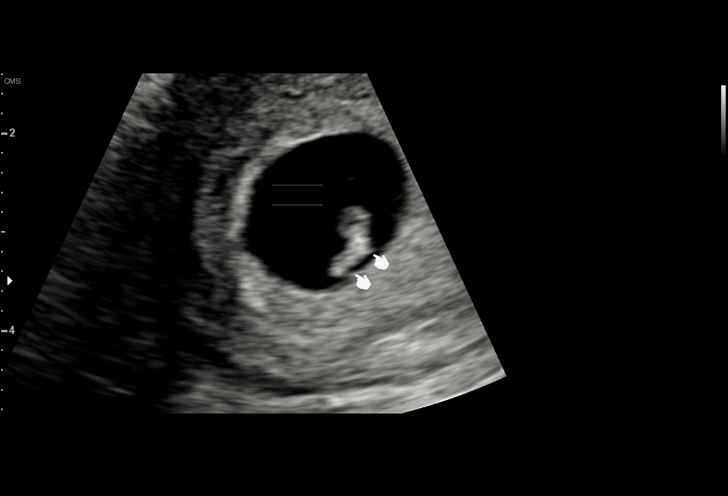

[15 of 28 positions shown; findings below may reference images not displayed]

FINDINGS: Intrauterine gestational sac: Single

Yolk sac:  Present

Embryo:  Present

Cardiac Activity: Present

Heart Rate: 116 bpm

MSD:   mm    w     d

CRL:   5.3 mm   5 w 3 d                  US EDC: October 20, 2018

Subchorionic hemorrhage:  None visualized.

Maternal uterus/adnexae: Right ovary is normal. Left ovary is not
visualized. No free fluid is noted.
IMPRESSION: Single live intrauterine gestation measuring to 5 weeks and 3 days
by crown-rump length without complications.

## 2020-03-25 ENCOUNTER — Other Ambulatory Visit: Payer: Self-pay

## 2020-03-25 ENCOUNTER — Encounter (HOSPITAL_COMMUNITY): Payer: Self-pay | Admitting: Emergency Medicine

## 2020-03-25 ENCOUNTER — Ambulatory Visit (HOSPITAL_COMMUNITY)
Admission: EM | Admit: 2020-03-25 | Discharge: 2020-03-25 | Disposition: A | Discharge status: Home / Self Care | Payer: Medicaid Other | Attending: Family Medicine | Admitting: Family Medicine

## 2020-03-25 DIAGNOSIS — J029 Acute pharyngitis, unspecified: Secondary | ICD-10-CM | POA: Diagnosis not present

## 2020-03-25 DIAGNOSIS — J020 Streptococcal pharyngitis: Secondary | ICD-10-CM

## 2020-03-25 DIAGNOSIS — R05 Cough: Secondary | ICD-10-CM

## 2020-03-25 LAB — POCT RAPID STREP A, ED / UC: Streptococcus, Group A Screen (Direct): NEGATIVE

## 2020-03-25 MED ORDER — AMOXICILLIN 500 MG PO CAPS
500.0000 mg | ORAL_CAPSULE | Freq: Two times a day (BID) | ORAL | 0 refills | Status: AC
Start: 2020-03-25 — End: 2020-04-04

## 2020-03-25 NOTE — ED Triage Notes (Signed)
Pt c/o of sore throat with white spots on tonsils. Pt c/o of productive cough with "lime green flim", denies fever, sob, cp.

## 2020-03-25 NOTE — ED Provider Notes (Signed)
[facility]    CSN: [id number] Arrival date & time: 03/25/20  1136      History   Chief Complaint Chief Complaint  Patient presents with  . Cough  . Sore Throat    HPI Jodi Terrell is a 24 y.o. female.   Pt complains of sore throat that started two days ago.  She denies fever, chills, nasal congestion, shortness of breath.  Reports she has coughed, feels like she is trying to cough something up.  She was dx with strep throat in June of this year and reports today's sx are similar.  She has taken nothing for the pain.      Past Medical History:  Diagnosis Date  . Arrhythmia    with pneumonia  . Heart murmur    as a child  . Pneumonia    09/01/14- 3- 4 years ago  . Seasonal allergies     Patient Active Problem List   Diagnosis Date Noted  . Polyhydramnios in singleton pregnancy in third trimester 10/21/2018  . Polyhydramnios in third trimester 10/14/2018  . LGA (large for gestational age) fetus affecting management of mother, third trimester 10/14/2018  . Chlamydia trachomatis infection during pregnancy in third trimester 09/30/2018  . Group B streptococcal infection during pregnancy 09/27/2018  . Exposure to chlamydia 05/11/2018  . Rubella non-immune status, antepartum 03/30/2018  . Supervision of other normal pregnancy, antepartum 03/22/2018  . Maternal morbid obesity, antepartum (HCC) 11/30/2013    Past Surgical History:  Procedure Laterality Date  . CLOSED REDUCTION NASAL FRACTURE N/A 09/01/2014   Procedure: CLOSED REDUCTION NASAL FRACTURE;  Surgeon: Glenna Fellows, MD;  Location: [facility] OR;  Service: Plastics;  Laterality: N/A;  . TYMPANOSTOMY TUBE PLACEMENT      OB History    Gravida  4   Para  2   Term  2   Preterm      AB  2   Living  2     SAB  2   TAB      Ectopic      Multiple  0   Live Births  2            Home Medications    Prior to Admission medications   Medication Sig Start Date End Date Taking?  Authorizing Provider  amoxicillin (AMOXIL) 500 MG capsule Take 1 capsule (500 mg total) by mouth 2 (two) times daily for 10 days. 03/25/20 04/04/20  Jodell Cipro, PA-C  metroNIDAZOLE (FLAGYL) 500 MG tablet Take 1 tablet (500 mg total) by mouth 2 (two) times daily. 02/06/20   Lamptey, Britta Mccreedy, MD  Prenatal Vit-Fe Fumarate-FA (PRENATAL MULTIVITAMIN) TABS tablet Take 1 tablet by mouth daily at 12 noon.    [provider]    Family History Family History  Problem Relation Age of Onset  . Diabetes Maternal Grandfather   . Cancer Paternal Grandmother   . Diabetes Paternal Grandfather     Social History Social History   Tobacco Use  . Smoking status: Former Smoker    Types: Cigarettes    Quit date: 11/02/2010    Years since quitting: 9.4  . Smokeless tobacco: Never Used  Vaping Use  . Vaping Use: Never used  Substance Use Topics  . Alcohol use: No  . Drug use: Yes    Types: Marijuana     Allergies   Patient has no known allergies.   Review of Systems Review of Systems  Constitutional: Negative for chills and fever.  HENT: Positive for sore throat. Negative for congestion, ear pain, postnasal drip and rhinorrhea.   Eyes: Negative for pain and visual disturbance.  Respiratory: Negative for cough and shortness of breath.   Cardiovascular: Negative for chest pain and palpitations.  Gastrointestinal: Negative for abdominal pain, nausea and vomiting.  Genitourinary: Negative for dysuria and hematuria.  Musculoskeletal: Negative for arthralgias and back pain.  Skin: Negative for color change and rash.  Neurological: Negative for seizures and syncope.  All other systems reviewed and are negative.    Physical Exam Triage Vital Signs ED Triage Vitals  Enc Vitals Group     BP 03/25/20 1311 (!) 119/57     Pulse Rate 03/25/20 1311 (!) 108     Resp 03/25/20 1311 18     Temp 03/25/20 1311 99.1 F (37.3 C)     Temp Source 03/25/20 1311 Oral     SpO2 03/25/20 1311 99 %      Weight --      Height --      Head Circumference --      Peak Flow --      Pain Score 03/25/20 1309 2     Pain Loc --      Pain Edu? --      Excl. in GC? --    No data found.  Updated Vital Signs BP (!) 119/57 (BP Location: Right Arm)   Pulse (!) 108   Temp 99.1 F (37.3 C) (Oral)   Resp 18   LMP 03/25/2020 (Exact Date)   SpO2 99%   Visual Acuity Right Eye Distance:   Left Eye Distance:   Bilateral Distance:    Right Eye Near:   Left Eye Near:    Bilateral Near:     Physical Exam Vitals and nursing note reviewed.  Constitutional:      General: She is not in acute distress.    Appearance: She is well-developed.  HENT:     Head: Normocephalic and atraumatic.     Mouth/Throat:     Pharynx: Posterior oropharyngeal erythema present. No uvula swelling.     Tonsils: Tonsillar exudate present. No tonsillar abscesses.     Comments: Bilateral tonsillar swelling, erythema, and exudates.  Uvula midline.  Eyes:     Conjunctiva/sclera: Conjunctivae normal.  Cardiovascular:     Rate and Rhythm: Normal rate and regular rhythm.     Heart sounds: No murmur heard.   Pulmonary:     Effort: Pulmonary effort is normal. No respiratory distress.     Breath sounds: Normal breath sounds.  Abdominal:     Palpations: Abdomen is soft.     Tenderness: There is no abdominal tenderness.  Musculoskeletal:     Cervical back: Neck supple.  Lymphadenopathy:     Cervical: Cervical adenopathy present.  Skin:    General: Skin is warm and dry.  Neurological:     Mental Status: She is alert.      UC Treatments / Results  Labs (all labs ordered are listed, but only abnormal results are displayed) Labs Reviewed  CULTURE, GROUP A STREP [facility])  POCT RAPID STREP A, ED / UC    EKG   Radiology No results found.  Procedures Procedures (including critical care time)  Medications Ordered in UC Medications - No data to display  Initial Impression / Assessment and Plan / UC Course   I have reviewed the triage vital signs and the nursing notes.  Pertinent labs & imaging results that were available during  my care of the patient were reviewed by me and considered in my medical decision making (see chart for details).    Sx consistent with strep throat.  Will treat with amoxicillin.  Throat culture pending.  Pt can taken tylenol or ibuprofen for discomfort.   Final Clinical Impressions(s) / UC Diagnoses   Final diagnoses:  Strep pharyngitis     Discharge Instructions     Take antibiotics as prescribed Throat culture pending, we will call you if we need to change the treatment plan.  May take ibuprofen for discomfort as needed.    ED Prescriptions    Medication Sig Dispense Auth. Provider   amoxicillin (AMOXIL) 500 MG capsule Take 1 capsule (500 mg total) by mouth 2 (two) times daily for 10 days. 20 capsule Jodell Cipro, PA-C     PDMP not reviewed this encounter.   Jodell Cipro, PA-C 03/25/20 1352

## 2020-03-25 NOTE — Discharge Instructions (Addendum)
Take antibiotics as prescribed Throat culture pending, we will call you if we need to change the treatment plan.  May take ibuprofen for discomfort as needed.

## 2020-03-28 LAB — CULTURE, GROUP A STREP (THRC)

## 2020-05-27 IMAGING — US US MFM OB DETAIL+14 WK
1 series · 14 of 28 positions shown · non-contrast
Comparison: none

[Series 1: us mfm ob detail+14 wk · 14 of 115 slices shown]
[im 5/115]
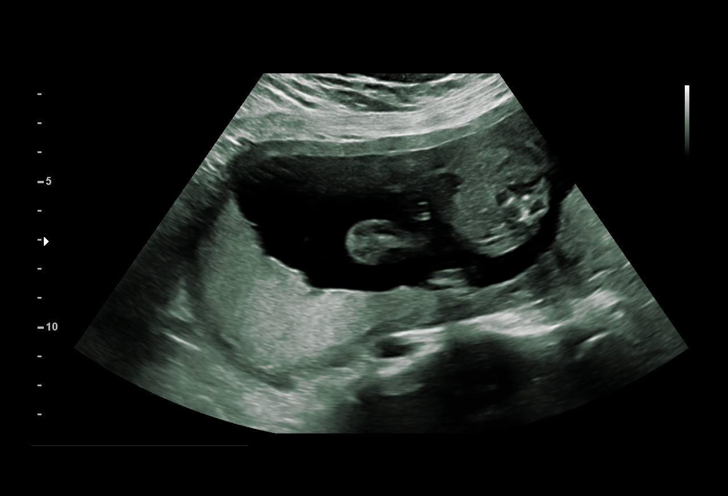
[im 13/115]
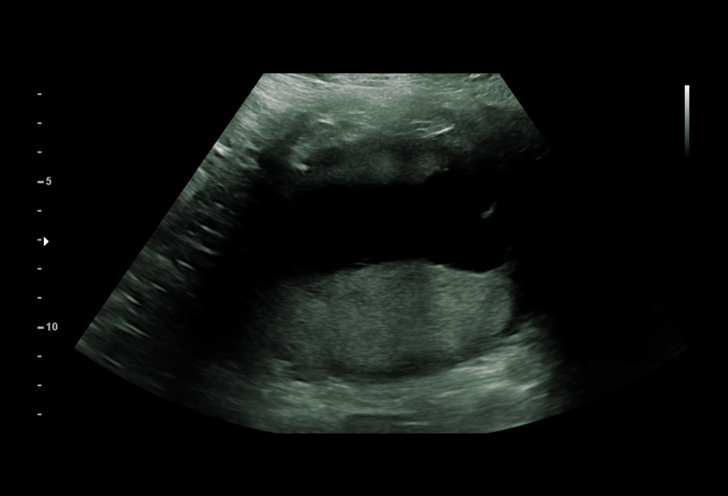
[im 22/115]
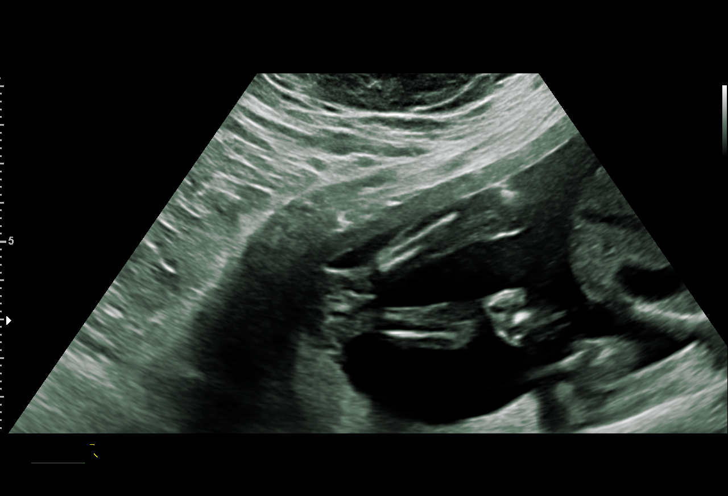
[im 30/115]
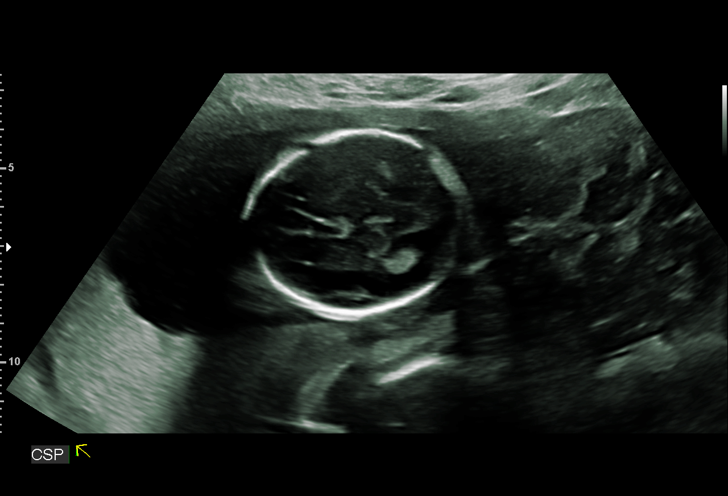
[im 39/115]
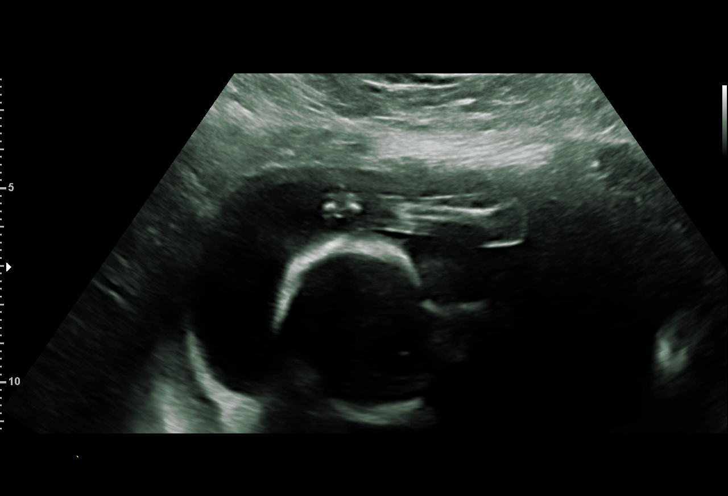
[im 47/115]
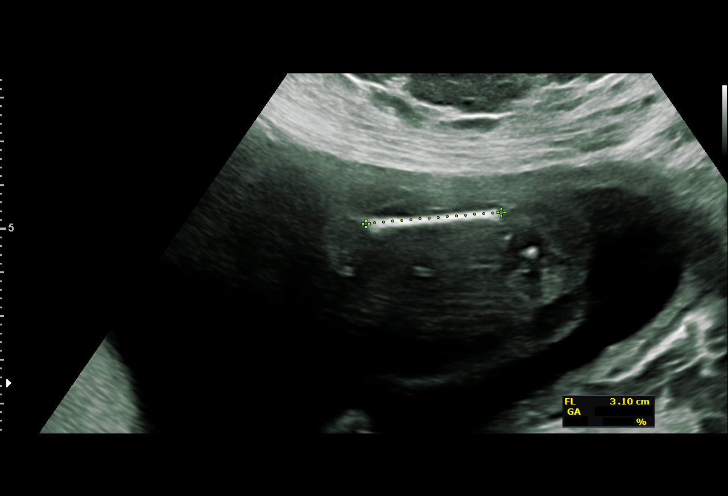
[im 55/115]
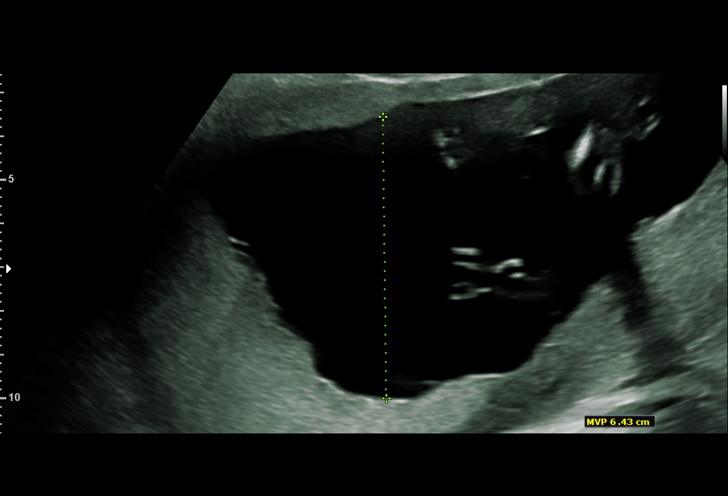
[im 64/115]
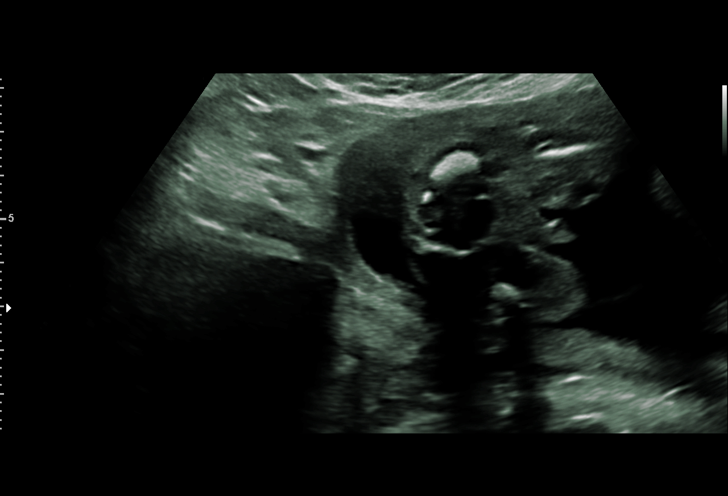
[im 72/115]
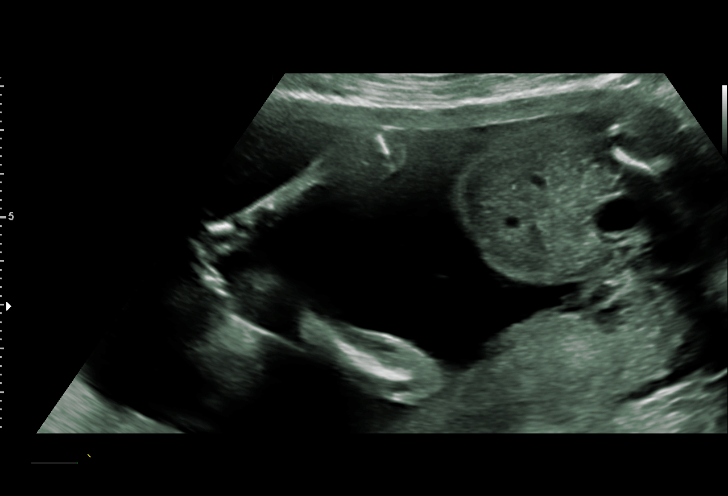
[im 81/115]
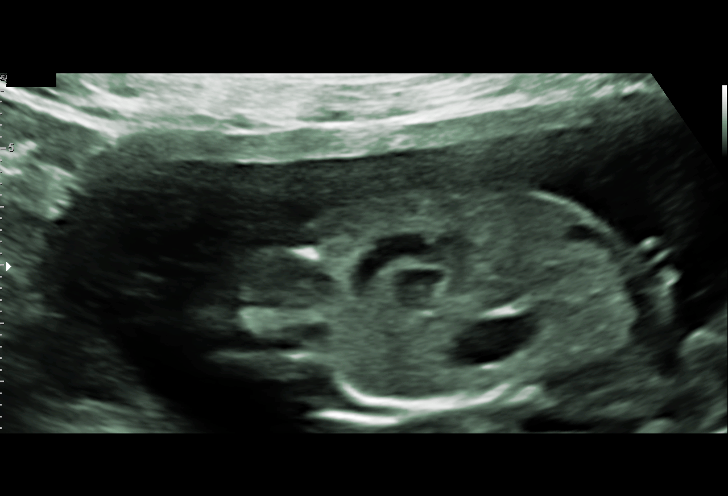
[im 89/115]
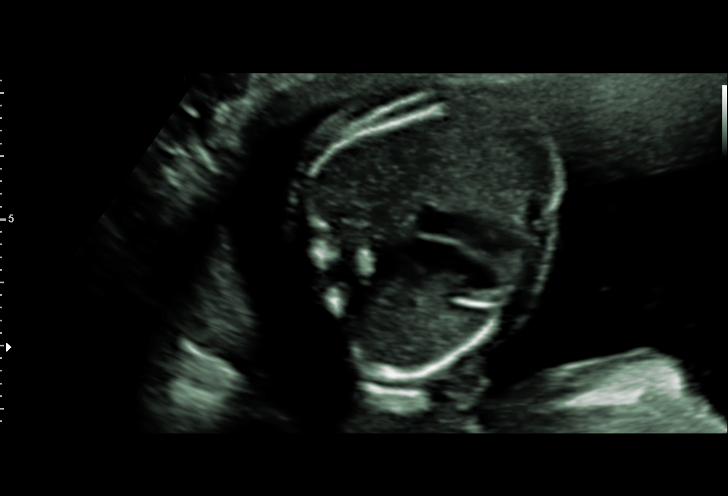
[im 98/115]
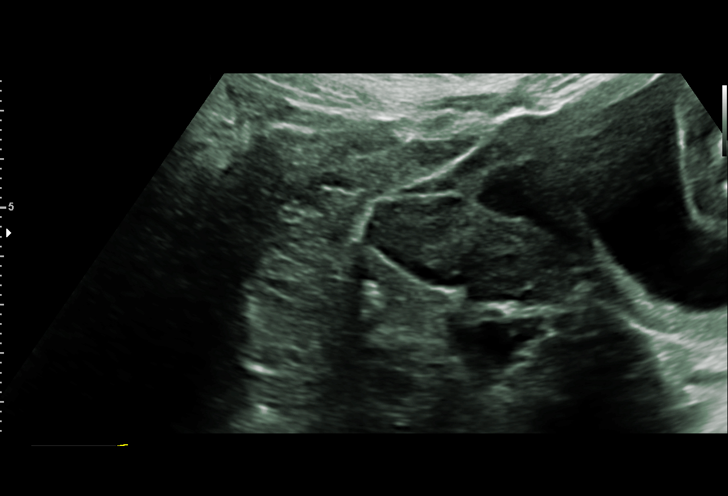
[im 106/115]
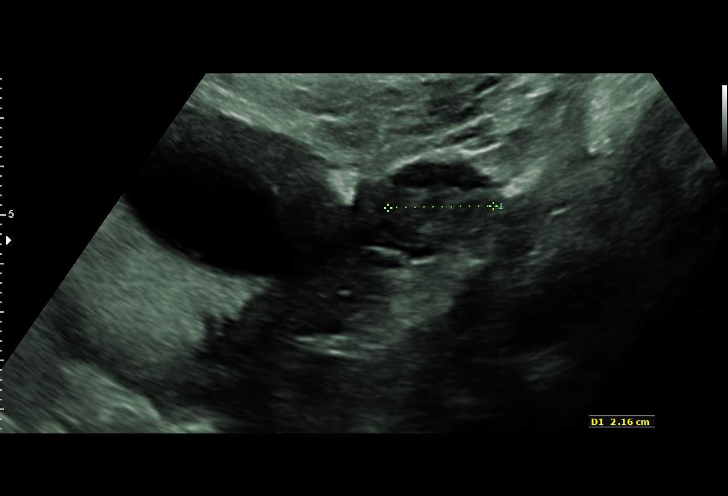
[im 115/115]
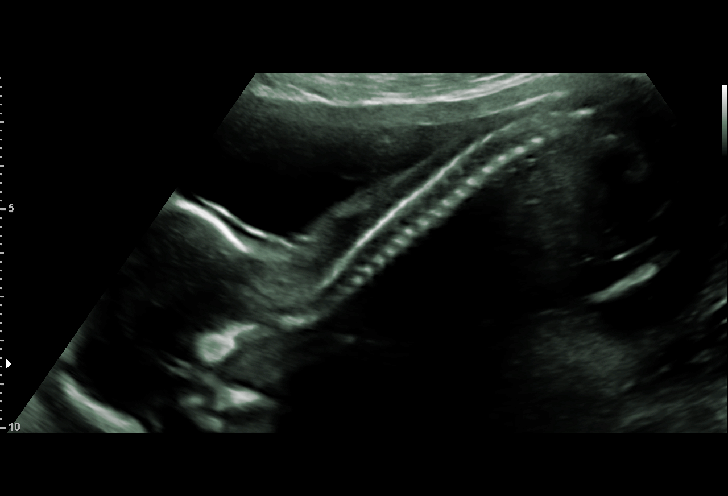

[14 of 28 positions shown; findings below may reference images not displayed]

Indications

Encounter for antenatal screening for
malformations
Obesity complicating pregnancy, second
trimester
Low risk NIPS
19 weeks gestation of pregnancy
Fetal Evaluation

Num Of Fetuses:         1
Fetal Heart Rate(bpm):  150
Cardiac Activity:       Observed
Presentation:           Variable
Placenta:               Posterior Fundal
P. Cord Insertion:      Visualized

Amniotic Fluid
AFI FV:      Within normal limits

Largest Pocket(cm)
6.43
Biometry

BPD:      46.7  mm     G. Age:  20w 1d         90  %    CI:        73.85   %    70 - 86
FL/HC:      18.1   %    16.1 -
HC:      172.6  mm     G. Age:  19w 6d         79  %    HC/AC:      1.13        1.09 -
AC:      153.3  mm     G. Age:  20w 4d         89  %    FL/BPD:     67.0   %
FL:       31.3  mm     G. Age:  19w 5d         69  %    FL/AC:      20.4   %    20 - 24
HUM:      31.4  mm     G. Age:  20w 3d         88  %
CER:      20.1  mm     G. Age:  19w 1d         52  %
NFT:       2.4  mm
LV:        5.2  mm
Est. FW:     334  gm    0 lb 12 oz      61  %
OB History

Gravidity:    4         Term:   1         SAB:   2
Living:       1
Gestational Age

LMP:           19w 6d        Date:  01/07/18                 EDD:   10/14/18
Clinical EDD:  19w 0d                                        EDD:   10/20/18
U/S Today:     20w 1d                                        EDD:   10/12/18
Best:          19w 0d     Det. By:  Clinical EDD             EDD:   10/20/18
Anatomy

Cranium:               Appears normal         LVOT:                   Appears normal
Cavum:                 Appears normal         Aortic Arch:            Appears normal
Ventricles:            Appears normal         Ductal Arch:            Not well visualized
Choroid Plexus:        Appears normal         Diaphragm:              Appears normal
Cerebellum:            Appears normal         Stomach:                Appears normal, left
sided
Posterior Fossa:       Appears normal         Abdomen:                Appears normal
Nuchal Fold:           Appears normal         Abdominal Wall:         Appears nml (cord
insert, abd wall)
Face:                  Appears normal         Cord Vessels:           Appears normal (3
(orbits and profile)                           vessel cord)
Lips:                  Appears normal         Kidneys:                Appear normal
Palate:                Appears normal         Bladder:                Appears normal
Thoracic:              Appears normal         Spine:                  Appears normal
Heart:                 Appears normal         Upper Extremities:      Appears normal
(4CH, axis, and
situs)
RVOT:                  Appears normal         Lower Extremities:      Appears normal

Other:  Male gender. Technically difficult due to fetal position and maternal
body habitus. Nasal bone visualized. Heels visualized.
Cervix Uterus Adnexa

Cervix
Length:              4  cm.
Normal appearance by transabdominal scan.

Left Ovary
Within normal limits.

Right Ovary
Within normal limits.
Comments

U/S images reviewed. Findings reviewed with patient.
Appropriate fetal growth is noted.  No fetal abnormalities are
seen.
Questions answered.
10 minutes spent face to face with patient.
Recommendations: 1) Serial U/S every 4 weeks for fetal
growth  (BMI - 41.23) 2) Weekly BPP beginning @ 36 weeks
Recommendations

1) Serial U/S every 4 weeks for fetal growth  (BMI - 41.23) 2)
Weekly BPP beginning @ 36 weeks

## 2020-06-24 IMAGING — US US MFM OB FOLLOW-UP
1 series · 13 of 28 positions shown · non-contrast
Comparison: none

[Series 1: us mfm ob follow-up · 13 of 49 slices shown]
[im 2/49]
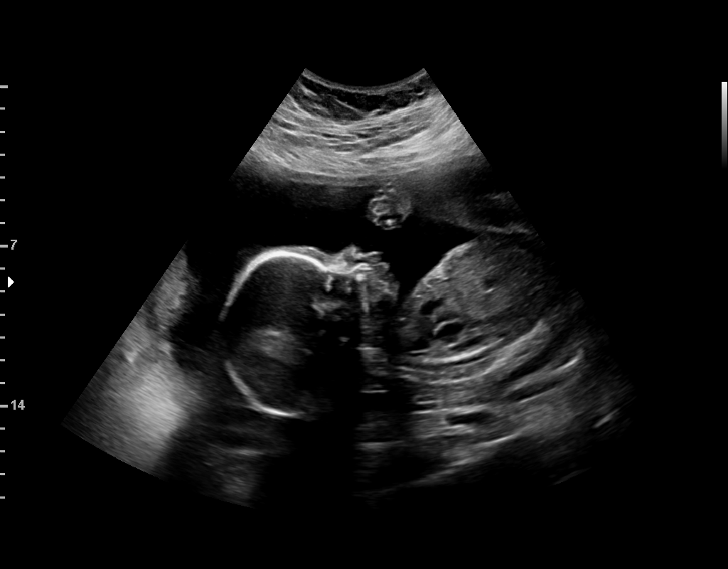
[im 6/49]
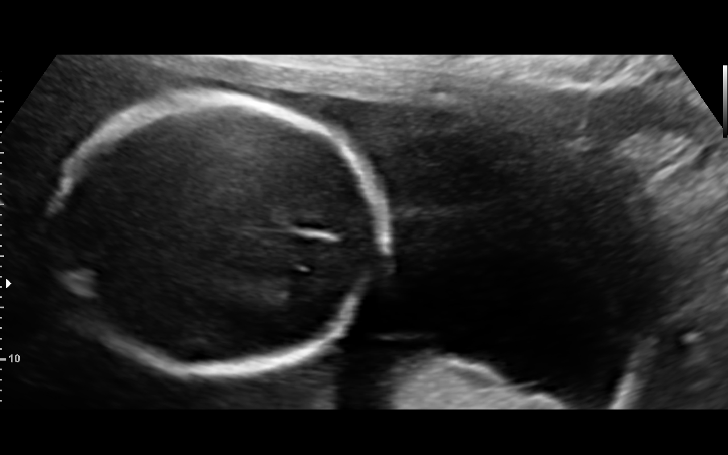
[im 9/49]
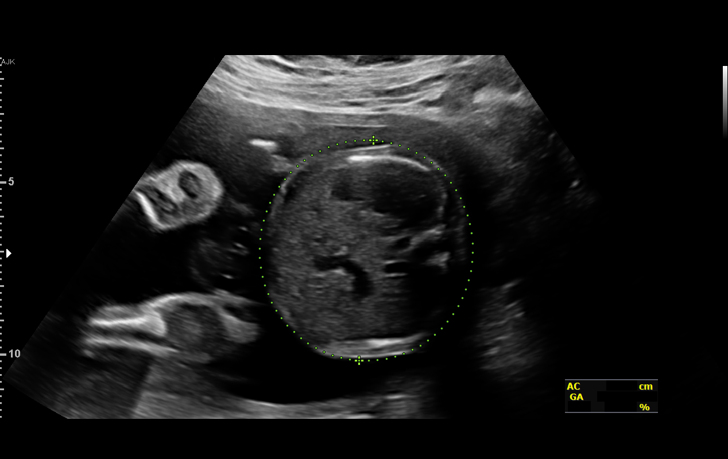
[im 13/49]
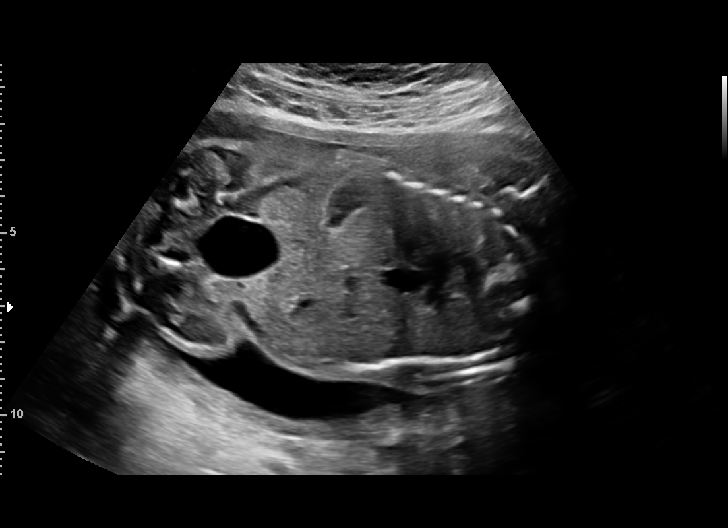
[im 17/49]
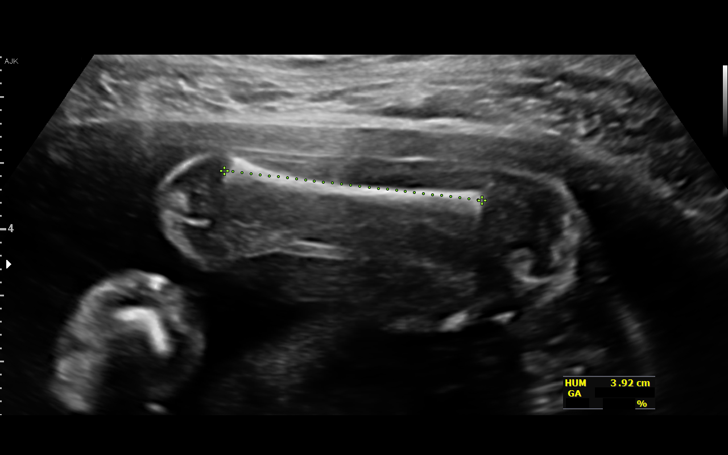
[im 20/49]
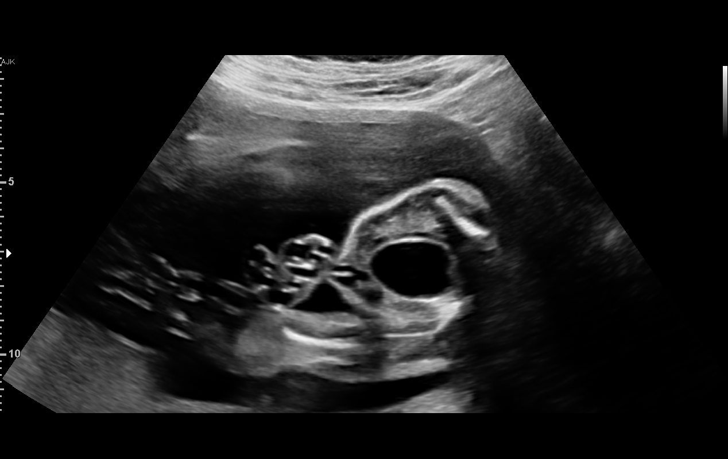
[im 25/49]
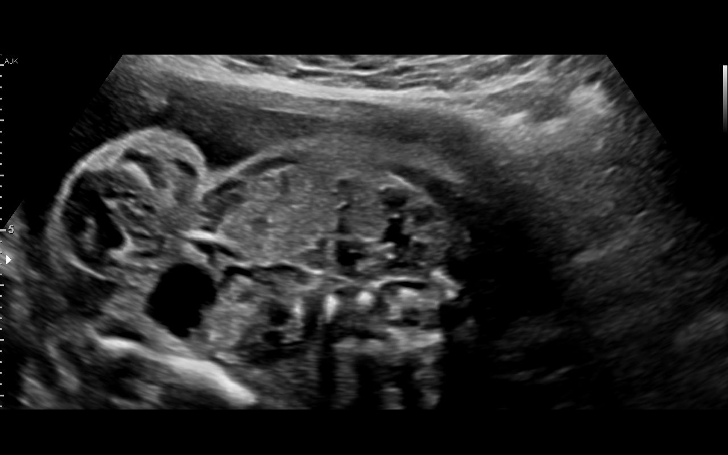
[im 29/49]
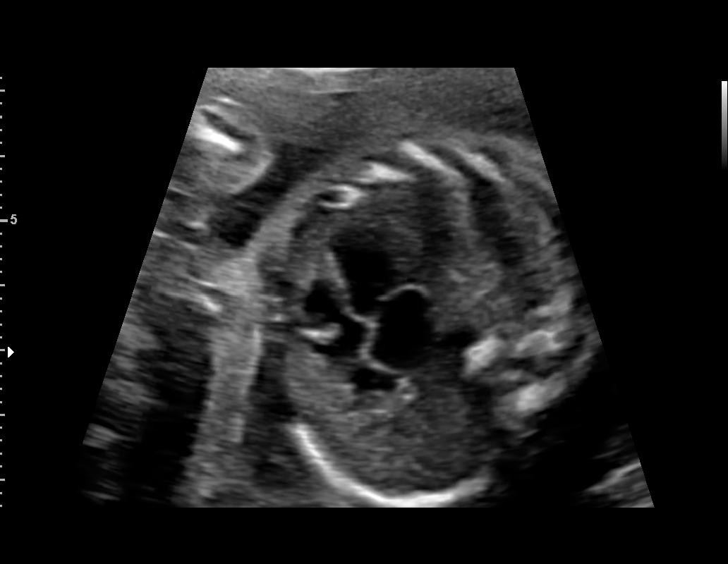
[im 33/49]
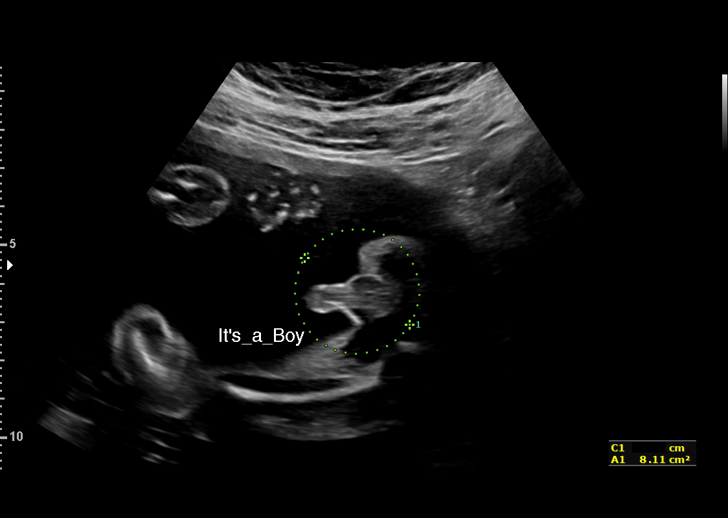
[im 36/49]
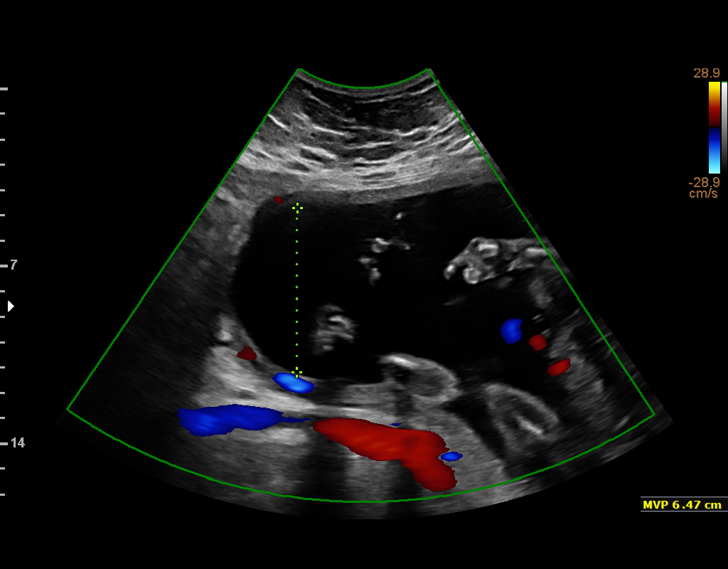
[im 40/49]
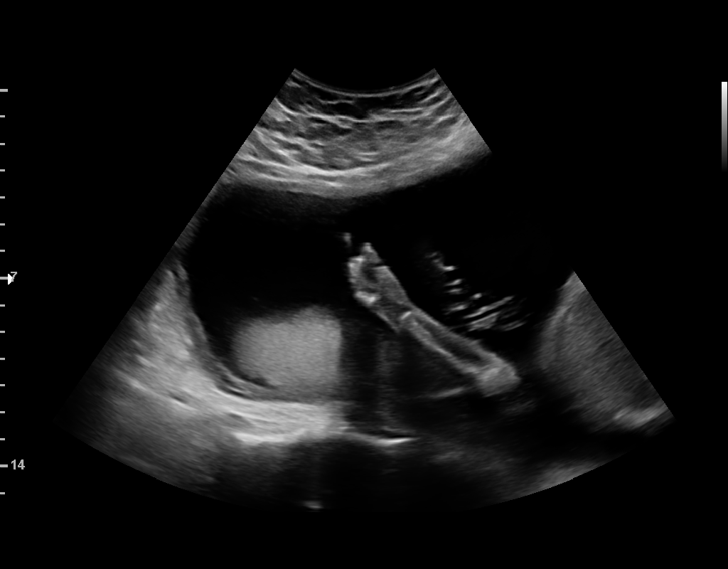
[im 43/49]
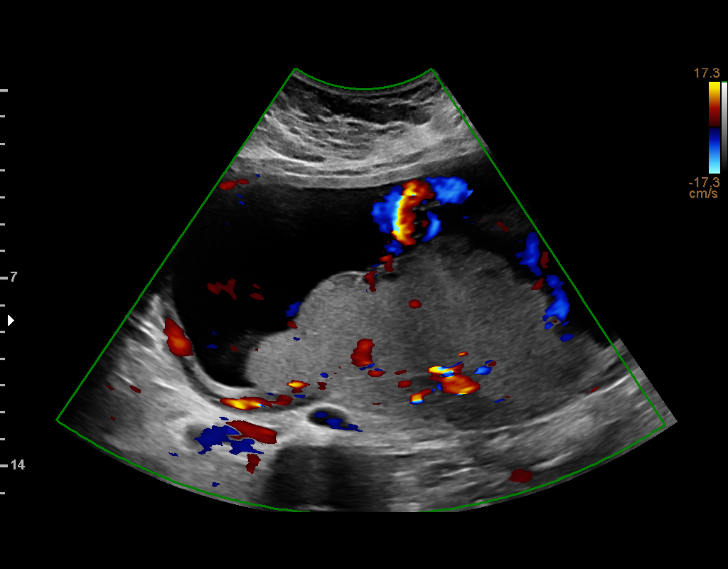
[im 47/49]
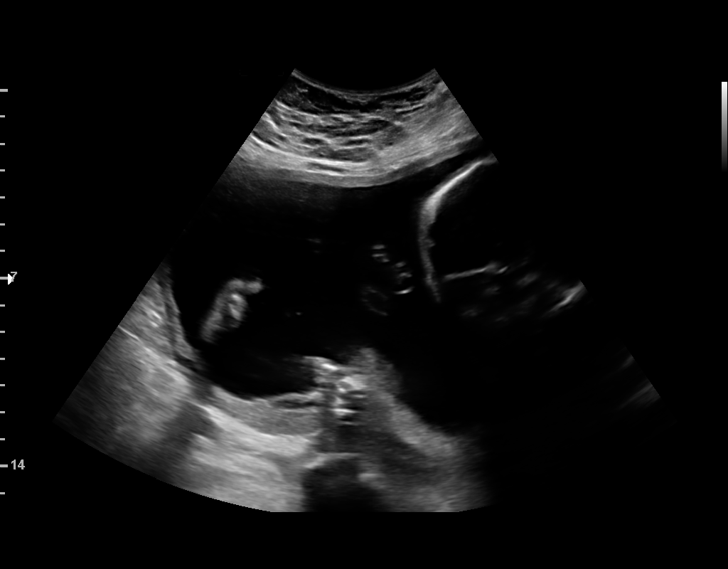

[13 of 28 positions shown; findings below may reference images not displayed]

----------------------------------------------------------------------

 ----------------------------------------------------------------------
Indications

  Obesity complicating pregnancy, second
  trimester
  Low risk NIPS
  Encounter for other antenatal screening
  follow-up
  23 weeks gestation of pregnancy
 ----------------------------------------------------------------------
Fetal Evaluation

 Num Of Fetuses:          1
 Fetal Heart Rate(bpm):   159
 Cardiac Activity:        Observed
 Presentation:            Breech
 Placenta:                Anterior
 P. Cord Insertion:       Visualized, central

 Amniotic Fluid
 AFI FV:      Within normal limits

                             Largest Pocket(cm)

Biometry

 BPD:      58.2  mm     G. Age:  23w 6d         76  %    CI:        74.27   %    70 - 86
                                                         FL/HC:       18.9  %    19.2 -
 HC:      214.4  mm     G. Age:  23w 4d         55  %    HC/AC:       1.07       1.05 -
 AC:      200.5  mm     G. Age:  24w 5d         88  %    FL/BPD:      69.6  %    71 - 87
 FL:       40.5  mm     G. Age:  23w 1d         42  %    FL/AC:       20.2  %    20 - 24
 HUM:      38.7  mm     G. Age:  23w 5d         58  %
 LV:        6.6  mm

 Est. FW:     645   gm     1 lb 7 oz     68  %
OB History

 Gravidity:    4         Term:   1         SAB:   2
 Living:       1
Gestational Age

 LMP:           23w 6d        Date:  01/07/18                 EDD:   10/14/18
 Clinical EDD:  23w 0d                                        EDD:   10/20/18
 U/S Today:     23w 6d                                        EDD:   10/14/18
 Best:          23w 0d     Det. By:  Clinical EDD             EDD:   10/20/18
Anatomy

 Cranium:               Appears normal         LVOT:                   Appears normal
 Cavum:                 Appears normal         Aortic Arch:            Previously seen
 Ventricles:            Appears normal         Ductal Arch:            Not well visualized
 Choroid Plexus:        Previously seen        Diaphragm:              Appears normal
 Cerebellum:            Previously seen        Stomach:                Appears normal, left
                                                                       sided
 Posterior Fossa:       Previously seen        Abdomen:                Appears normal
 Nuchal Fold:           Previously seen        Abdominal Wall:         Appears nml (cord
                                                                       insert, abd wall)
 Face:                  Orbits and profile     Cord Vessels:           Appears normal (3
                        previously seen                                vessel cord)
 Lips:                  Appears normal         Kidneys:                Appear normal
 Palate:                Previously seen        Bladder:                Appears normal
 Thoracic:              Appears normal         Spine:                  Previously seen
 Heart:                 Previously seen        Upper Extremities:      Previously seen
 RVOT:                  Appears normal         Lower Extremities:      Previously seen

 Other:  Male gender. Technically difficult due to fetal position and maternal
         body habitus. Nasal bone and heels previously visualized.
Cervix Uterus Adnexa

 Cervix
 Length:           4.29  cm.
 Normal appearance by transabdominal scan.

 Uterus
 No abnormality visualized.

 Left Ovary
 Not visualized.

 Right Ovary
 Not visualized.

 Adnexa
 No abnormality visualized.
Impression

 Normal interval growth.
Recommendations

 Continue serial growth every 4 weeks
 Initiate weekly biophysical profiles at 36 weeks.

## 2020-07-20 ENCOUNTER — Ambulatory Visit (HOSPITAL_COMMUNITY)
Admission: EM | Admit: 2020-07-20 | Discharge: 2020-07-20 | Disposition: A | Discharge status: Home / Self Care | Payer: Medicaid Other | Attending: Internal Medicine | Admitting: Internal Medicine

## 2020-07-20 ENCOUNTER — Other Ambulatory Visit: Payer: Self-pay

## 2020-07-20 ENCOUNTER — Encounter (HOSPITAL_COMMUNITY): Payer: Self-pay | Admitting: Emergency Medicine

## 2020-07-20 DIAGNOSIS — J029 Acute pharyngitis, unspecified: Secondary | ICD-10-CM | POA: Diagnosis not present

## 2020-07-20 DIAGNOSIS — Z87891 Personal history of nicotine dependence: Secondary | ICD-10-CM | POA: Insufficient documentation

## 2020-07-20 DIAGNOSIS — B9789 Other viral agents as the cause of diseases classified elsewhere: Secondary | ICD-10-CM | POA: Insufficient documentation

## 2020-07-20 DIAGNOSIS — Z20822 Contact with and (suspected) exposure to covid-19: Secondary | ICD-10-CM | POA: Diagnosis not present

## 2020-07-20 LAB — POCT RAPID STREP A, ED / UC: Streptococcus, Group A Screen (Direct): NEGATIVE

## 2020-07-20 LAB — RESP PANEL BY RT-PCR (FLU A&B, COVID) ARPGX2
Influenza A by PCR: NEGATIVE
Influenza B by PCR: NEGATIVE
SARS Coronavirus 2 by RT PCR: NEGATIVE

## 2020-07-20 NOTE — ED Provider Notes (Signed)
[facility]    CSN: [id number] Arrival date & time: 07/20/20  0803      History   Chief Complaint Chief Complaint  Patient presents with  . Sore Throat    HPI Jodi Terrell is a 24 y.o. female comes to urgent care with complaints of sore throat of 2 days duration.  Patient says symptoms started 2 days ago and has been persistent.  No difficulty swallowing.  No fever or chills.  No generalized body aches.  No loss of taste or smell.  Patient has been vaccinated against COVID-19 virus.  She has concerns about having HSV of the throat.  No rash on the lips.  She has had strep throat 3 times this year and would like to be evaluated for strep throat.  No runny nose or nasal congestion.  No generalized body ache.   HPI  Past Medical History:  Diagnosis Date  . Arrhythmia    with pneumonia  . Heart murmur    as a child  . Pneumonia    09/01/14- 3- 4 years ago  . Seasonal allergies     Patient Active Problem List   Diagnosis Date Noted  . Polyhydramnios in singleton pregnancy in third trimester 10/21/2018  . Polyhydramnios in third trimester 10/14/2018  . LGA (large for gestational age) fetus affecting management of mother, third trimester 10/14/2018  . Chlamydia trachomatis infection during pregnancy in third trimester 09/30/2018  . Group B streptococcal infection during pregnancy 09/27/2018  . Exposure to chlamydia 05/11/2018  . Rubella non-immune status, antepartum 03/30/2018  . Supervision of other normal pregnancy, antepartum 03/22/2018  . Maternal morbid obesity, antepartum (HCC) 11/30/2013    Past Surgical History:  Procedure Laterality Date  . CLOSED REDUCTION NASAL FRACTURE N/A 09/01/2014   Procedure: CLOSED REDUCTION NASAL FRACTURE;  Surgeon: Glenna Fellows, MD;  Location: [facility] OR;  Service: Plastics;  Laterality: N/A;  . TYMPANOSTOMY TUBE PLACEMENT      OB History    Gravida  4   Para  2   Term  2   Preterm      AB  2   Living  2      SAB  2   IAB      Ectopic      Multiple  0   Live Births  2            Home Medications    Prior to Admission medications   Medication Sig Start Date End Date Taking? Authorizing Provider  metroNIDAZOLE (FLAGYL) 500 MG tablet Take 1 tablet (500 mg total) by mouth 2 (two) times daily. 02/06/20   Emelin Dascenzo, Britta Mccreedy, MD  Prenatal Vit-Fe Fumarate-FA (PRENATAL MULTIVITAMIN) TABS tablet Take 1 tablet by mouth daily at 12 noon.    [provider]    Family History Family History  Problem Relation Age of Onset  . Diabetes Maternal Grandfather   . Cancer Paternal Grandmother   . Diabetes Paternal Grandfather     Social History Social History   Tobacco Use  . Smoking status: Former Smoker    Types: Cigarettes    Quit date: 11/02/2010    Years since quitting: 9.7  . Smokeless tobacco: Never Used  Vaping Use  . Vaping Use: Never used  Substance Use Topics  . Alcohol use: No  . Drug use: Yes    Types: Marijuana     Allergies   Patient has no known allergies.   Review of Systems Review of Systems  Constitutional: Negative.   HENT: Positive for sore throat.   Eyes: Negative.   Respiratory: Negative.   Cardiovascular: Negative.   Genitourinary: Negative.      Physical Exam Triage Vital Signs ED Triage Vitals  Enc Vitals Group     BP 07/20/20 0835 104/64     Pulse Rate 07/20/20 0835 93     Resp 07/20/20 0835 17     Temp 07/20/20 0835 98.1 F (36.7 C)     Temp Source 07/20/20 0835 Oral     SpO2 07/20/20 0835 99 %     Weight 07/20/20 0834 260 lb (117.9 kg)     Height 07/20/20 0834 5\' 3"  (1.6 m)     Head Circumference --      Peak Flow --      Pain Score 07/20/20 0833 8     Pain Loc --      Pain Edu? --      Excl. in GC? --    No data found.  Updated Vital Signs BP 104/64 (BP Location: Right Arm)   Pulse 93   Temp 98.1 F (36.7 C) (Oral)   Resp 17   Ht 5\' 3"  (1.6 m)   Wt 117.9 kg   SpO2 99%   BMI 46.06 kg/m   Visual Acuity Right  Eye Distance:   Left Eye Distance:   Bilateral Distance:    Right Eye Near:   Left Eye Near:    Bilateral Near:     Physical Exam Vitals and nursing note reviewed.  Constitutional:      Appearance: She is well-developed.  HENT:     Mouth/Throat:     Mouth: Mucous membranes are moist.     Pharynx: Uvula midline. No pharyngeal swelling, oropharyngeal exudate or posterior oropharyngeal erythema.     Tonsils: No tonsillar exudate or tonsillar abscesses. 1+ on the right. 1+ on the left.     Comments: Few white patches over the tonsils bilaterally.  No significant erythema.  No ulcerations in the oropharynx Eyes:     Conjunctiva/sclera: Conjunctivae normal.  Cardiovascular:     Rate and Rhythm: Normal rate and regular rhythm.  Neurological:     Mental Status: She is alert.      UC Treatments / Results  Labs (all labs ordered are listed, but only abnormal results are displayed) Labs Reviewed  CULTURE, GROUP A STREP ([facility])  RESP PANEL BY RT-PCR (FLU A&B, COVID) [id number]  POCT RAPID STREP A, ED / UC    EKG   Radiology No results found.  Procedures Procedures (including critical care time)  Medications Ordered in UC Medications - No data to display  Initial Impression / Assessment and Plan / UC Course  I have reviewed the triage vital signs and the nursing notes.  Pertinent labs & imaging results that were available during my care of the patient were reviewed by me and considered in my medical decision making (see chart for details).     1.  Viral pharyngitis: Warm salt water gargle Tylenol as needed for fever and or pain Respiratory PCR for flu and COVID-19 Quarantine until COVID-19 test results are available Patient was reassured that she has no signs of HSV at this time Final Clinical Impressions(s) / UC Diagnoses   Final diagnoses:  Viral pharyngitis     Discharge Instructions     Warm salt water gargle Tylenol as needed for fever and/or pain Please  quarantine until COVID-19 results are available We will call you  with recommendations if your results are abnormal. Increase oral fluids.   ED Prescriptions    None     PDMP not reviewed this encounter.   Merrilee Jansky, MD 07/20/20 1012

## 2020-07-20 NOTE — ED Triage Notes (Signed)
Patient c/o sore throat x 2 days.   Patient endorses "I have white spots on the back of my throat".   Patient denies fever or cough.   Patient took Tylenol and took some amoxicillin last night and this morning.   Patient states she's had strep throat 3 times this year.   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

## 2020-07-20 NOTE — Discharge Instructions (Addendum)
Warm salt water gargle Tylenol as needed for fever and/or pain Please quarantine until COVID-19 results are available We will call you with recommendations if your results are abnormal. Increase oral fluids.

## 2020-07-22 ENCOUNTER — Encounter (HOSPITAL_COMMUNITY): Payer: Self-pay | Admitting: Emergency Medicine

## 2020-07-22 ENCOUNTER — Emergency Department (HOSPITAL_COMMUNITY)
Admission: EM | Admit: 2020-07-22 | Discharge: 2020-07-22 | Disposition: A | Discharge status: Home / Self Care | Payer: Medicaid Other | Attending: Emergency Medicine | Admitting: Emergency Medicine

## 2020-07-22 ENCOUNTER — Other Ambulatory Visit: Payer: Self-pay

## 2020-07-22 DIAGNOSIS — J029 Acute pharyngitis, unspecified: Secondary | ICD-10-CM | POA: Diagnosis not present

## 2020-07-22 DIAGNOSIS — Z87891 Personal history of nicotine dependence: Secondary | ICD-10-CM | POA: Diagnosis not present

## 2020-07-22 DIAGNOSIS — R070 Pain in throat: Secondary | ICD-10-CM | POA: Diagnosis present

## 2020-07-22 LAB — CULTURE, GROUP A STREP (THRC)

## 2020-07-22 MED ORDER — CEFTRIAXONE SODIUM 500 MG IJ SOLR
500.0000 mg | Freq: Once | INTRAMUSCULAR | Status: AC
  Administered 2020-07-22: 500 mg via INTRAMUSCULAR
  Filled 2020-07-22: qty 500

## 2020-07-22 MED ORDER — KETOROLAC TROMETHAMINE 60 MG/2ML IM SOLN
30.0000 mg | Freq: Once | INTRAMUSCULAR | Status: AC
  Administered 2020-07-22: 30 mg via INTRAMUSCULAR
  Filled 2020-07-22: qty 2

## 2020-07-22 MED ORDER — AMOXICILLIN-POT CLAVULANATE 875-125 MG PO TABS
1.0000 | ORAL_TABLET | Freq: Two times a day (BID) | ORAL | 0 refills | Status: DC
Start: 2020-07-22 — End: 2020-10-01

## 2020-07-22 MED ORDER — DOXYCYCLINE HYCLATE 100 MG PO CAPS
100.0000 mg | ORAL_CAPSULE | Freq: Two times a day (BID) | ORAL | 0 refills | Status: AC
Start: 2020-07-22 — End: 2020-07-29

## 2020-07-22 NOTE — ED Triage Notes (Signed)
C/o sore throat with "white spots" x 4 days.  Seen at [facility] 2 days ago and had negative strep, COVID, and flu test per pt.

## 2020-07-22 NOTE — ED Provider Notes (Signed)
[facility] EMERGENCY DEPARTMENT Provider Note   CSN: [id number] Arrival date & time: 07/22/20  1748     History Chief Complaint  Patient presents with  . Sore Throat    Jodi Terrell is a 24 y.o. female.  Presents to ER with concern for sore throat.  Patient reports over the last few days she has had steadily progressing pain in her throat, also noting white spots on her throat.  Pain worse with swallowing.  Has been able to tolerate fluids and solids.  No generalized neck pain or neck swelling.  No fevers.  Sexually active, 2 partners over the last few months, has had oral sex.  Uses condoms for vaginal intercourse.  HPI     Past Medical History:  Diagnosis Date  . Arrhythmia    with pneumonia  . Heart murmur    as a child  . Pneumonia    09/01/14- 3- 4 years ago  . Seasonal allergies     Patient Active Problem List   Diagnosis Date Noted  . Polyhydramnios in singleton pregnancy in third trimester 10/21/2018  . Polyhydramnios in third trimester 10/14/2018  . LGA (large for gestational age) fetus affecting management of mother, third trimester 10/14/2018  . Chlamydia trachomatis infection during pregnancy in third trimester 09/30/2018  . Group B streptococcal infection during pregnancy 09/27/2018  . Exposure to chlamydia 05/11/2018  . Rubella non-immune status, antepartum 03/30/2018  . Supervision of other normal pregnancy, antepartum 03/22/2018  . Maternal morbid obesity, antepartum (HCC) 11/30/2013    Past Surgical History:  Procedure Laterality Date  . CLOSED REDUCTION NASAL FRACTURE N/A 09/01/2014   Procedure: CLOSED REDUCTION NASAL FRACTURE;  Surgeon: Glenna Fellows, MD;  Location: [facility] OR;  Service: Plastics;  Laterality: N/A;  . TYMPANOSTOMY TUBE PLACEMENT       OB History    Gravida  4   Para  2   Term  2   Preterm      AB  2   Living  2     SAB  2   IAB      Ectopic      Multiple  0   Live Births  2            Family History  Problem Relation Age of Onset  . Diabetes Maternal Grandfather   . Cancer Paternal Grandmother   . Diabetes Paternal Grandfather     Social History   Tobacco Use  . Smoking status: Former Smoker    Types: Cigarettes    Quit date: 11/02/2010    Years since quitting: 9.7  . Smokeless tobacco: Never Used  Vaping Use  . Vaping Use: Never used  Substance Use Topics  . Alcohol use: No  . Drug use: Yes    Types: Marijuana    Home Medications Prior to Admission medications   Medication Sig Start Date End Date Taking? Authorizing Provider  amoxicillin-clavulanate (AUGMENTIN) 875-125 MG tablet Take 1 tablet by mouth every 12 (twelve) hours. 07/22/20   Milagros Loll, MD  doxycycline (VIBRAMYCIN) 100 MG capsule Take 1 capsule (100 mg total) by mouth 2 (two) times daily for 7 days. 07/22/20 07/29/20  Milagros Loll, MD  metroNIDAZOLE (FLAGYL) 500 MG tablet Take 1 tablet (500 mg total) by mouth 2 (two) times daily. 02/06/20   Lamptey, Britta Mccreedy, MD  Prenatal Vit-Fe Fumarate-FA (PRENATAL MULTIVITAMIN) TABS tablet Take 1 tablet by mouth daily at 12 noon.    [provider]  Allergies    Patient has no known allergies.  Review of Systems   Review of Systems  Constitutional: Negative for chills and fever.  HENT: Positive for sore throat. Negative for ear pain.   Eyes: Negative for pain and visual disturbance.  Respiratory: Negative for cough and shortness of breath.   Cardiovascular: Negative for chest pain and palpitations.  Gastrointestinal: Negative for abdominal pain and vomiting.  Genitourinary: Negative for dysuria and hematuria.  Musculoskeletal: Negative for arthralgias and back pain.  Skin: Negative for color change and rash.  Neurological: Negative for seizures and syncope.  All other systems reviewed and are negative.   Physical Exam Updated Vital Signs BP 124/84   Pulse 88   Temp 98.6 F (37 C)   Resp 18   SpO2 98%   Physical  Exam Vitals and nursing note reviewed.  Constitutional:      General: She is not in acute distress.    Appearance: She is well-developed and well-nourished.  HENT:     Head: Normocephalic and atraumatic.     Mouth/Throat:     Tonsils: Tonsillar exudate present.     Comments: Bilateral tonsillar exudate, somewhat erythematous, no abscess appreciated, patent airway Eyes:     Conjunctiva/sclera: Conjunctivae normal.  Pulmonary:     Effort: Pulmonary effort is normal.  Musculoskeletal:        General: No edema.     Cervical back: Neck supple.  Skin:    General: Skin is warm and dry.  Neurological:     General: No focal deficit present.     Mental Status: She is alert.  Psychiatric:        Mood and Affect: Mood and affect and mood normal.        Behavior: Behavior normal.     ED Results / Procedures / Treatments   Labs (all labs ordered are listed, but only abnormal results are displayed) Labs Reviewed  GC/CHLAMYDIA PROBE AMP ([place]) NOT AT [facility]    EKG None  Radiology No results found.  Procedures Procedures (including critical care time)  Medications Ordered in ED Medications  cefTRIAXone (ROCEPHIN) injection 500 mg (has no administration in time range)  ketorolac (TORADOL) injection 30 mg (has no administration in time range)    ED Course  I have reviewed the triage vital signs and the nursing notes.  Pertinent labs & imaging results that were available during my care of the patient were reviewed by me and considered in my medical decision making (see chart for details).    MDM Rules/Calculators/A&P                         24 year old presents to ER with concern for sore throat.  On exam she is well-appearing with stable vital signs, noted bilateral tonsillar exudates.  No associated cough.  Concern for pharyngitis.  Strep, Covid negative at urgent care.  Given her sexual history will check for GC chlamydia and treat empirically with Rocephin and  doxycycline course.  Will also cover with course of Augmentin.  Recommended recheck with primary doctor this coming week.  Consider discharge home.  After the discussed management above, the patient was determined to be safe for discharge.  The patient was in agreement with this plan and all questions regarding their care were answered.  ED return precautions were discussed and the patient will return to the ED with any significant worsening of condition.  Final Clinical Impression(s) / ED Diagnoses  Final diagnoses:  Pharyngitis, unspecified etiology    Rx / DC Orders ED Discharge Orders         Ordered    doxycycline (VIBRAMYCIN) 100 MG capsule  2 times daily        07/22/20 2035    amoxicillin-clavulanate (AUGMENTIN) 875-125 MG tablet  Every 12 hours        07/22/20 2035           Milagros Loll, MD 07/22/20 2040

## 2020-07-22 NOTE — Discharge Instructions (Addendum)
Please take antibiotic as prescribed for your throat infection.  Recommend following safe sex practices.  If the gonorrhea/chlamydia test comes back positive, you will need to notify any of your partners of this result.  If you have worsening pain, difficulty swallowing, difficulty breathing or other new concerning symptom, return to ER for reassessment.  Recommend recheck with your primary doctor this week.

## 2020-07-23 ENCOUNTER — Telehealth: Payer: Self-pay

## 2020-07-23 NOTE — Telephone Encounter (Signed)
Transition Care Management Follow-up Telephone Call  Date of discharge and from where: 07/22/2020 [facility]  How have you been since you were released from the hospital? Still not feeling well.   Any questions or concerns? No  Items Reviewed:  Did the pt receive and understand the discharge instructions provided? Yes   Medications obtained and verified? Will pick up later today.   Other? No   Any new allergies since your discharge? No   Dietary orders reviewed? Yes  Do you have support at home? Yes   Functional Questionnaire: (I = Independent and D = Dependent) ADLs: I  Bathing/Dressing- I  Meal Prep- I  Eating- I  Maintaining continence- I  Transferring/Ambulation- I  Managing Meds- I  Follow up appointments reviewed:   PCP Hospital f/u appt confirmed? No  Will follow up with PCP Tomi Bamberger, NP once finished with round of antibiotics.   Specialist Hospital f/u appt confirmed? No    Are transportation arrangements needed? No   If their condition worsens, is the pt aware to call PCP or go to the Emergency Dept.? Yes  Was the patient provided with contact information for the PCP's office or ED? Yes  Was to pt encouraged to call back with questions or concerns? Yes

## 2020-08-19 IMAGING — US US MFM OB FOLLOW-UP
1 series · 14 of 23 positions shown · non-contrast
Comparison: none

[Series 1: us mfm ob follow-up · 14 of 23 slices shown]
[im 1/23]
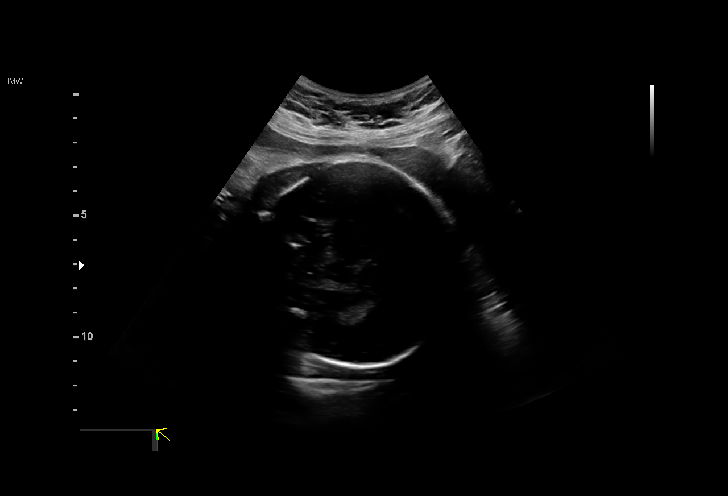
[im 3/23]
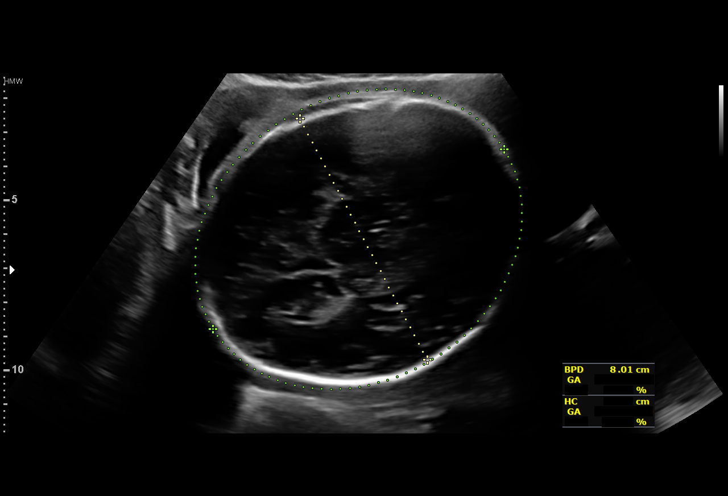
[im 5/23]
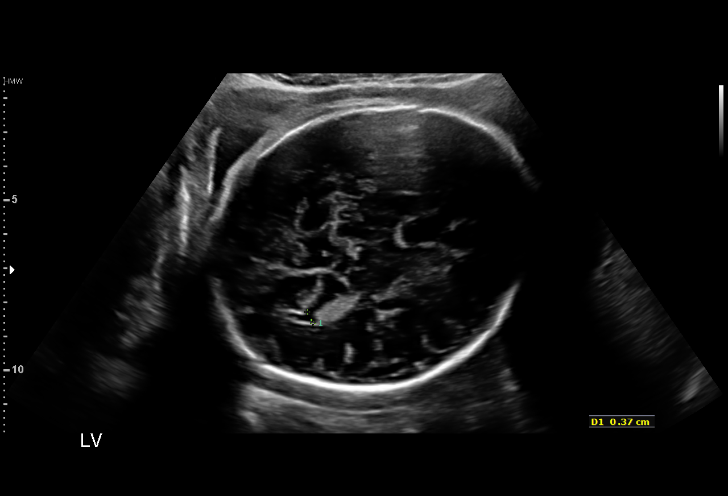
[im 6/23]
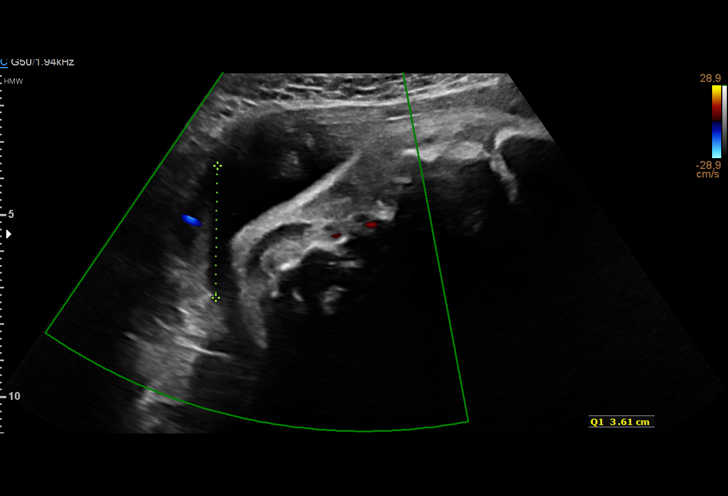
[im 8/23]
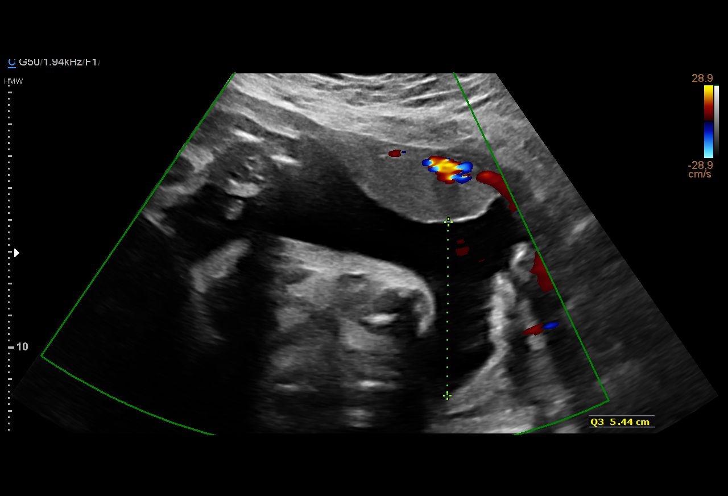
[im 10/23]
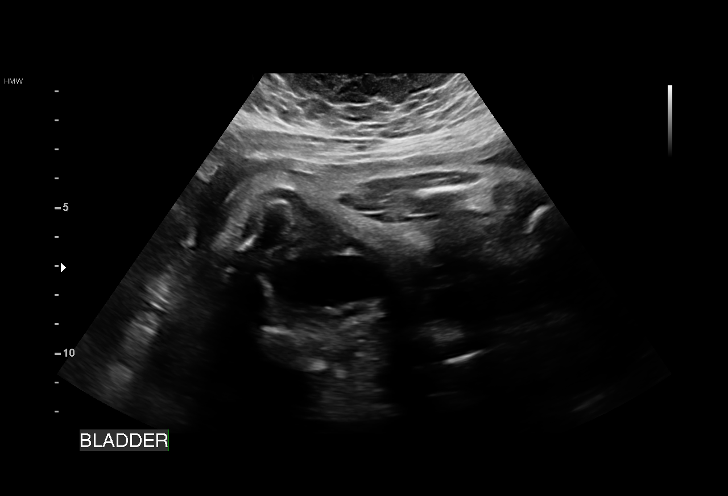
[im 11/23]
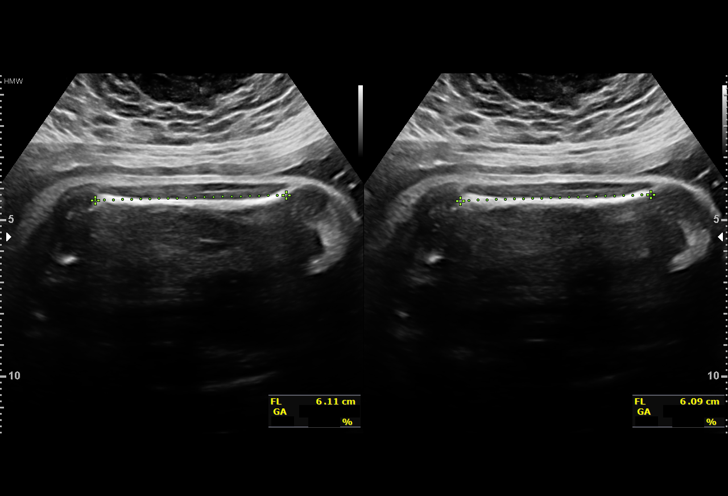
[im 13/23]
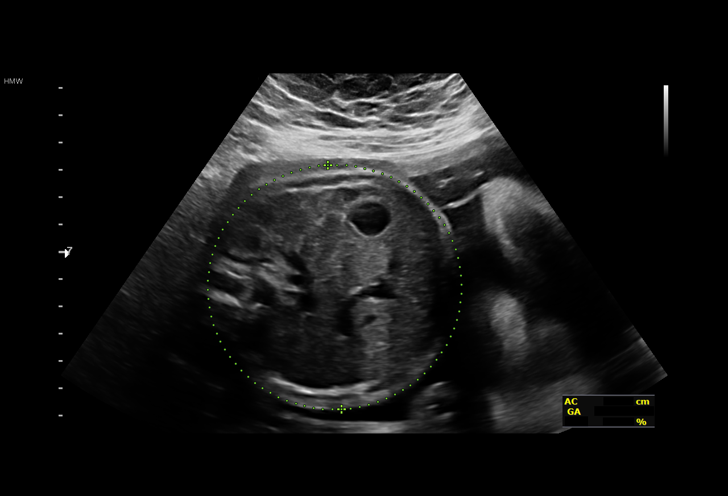
[im 14/23]
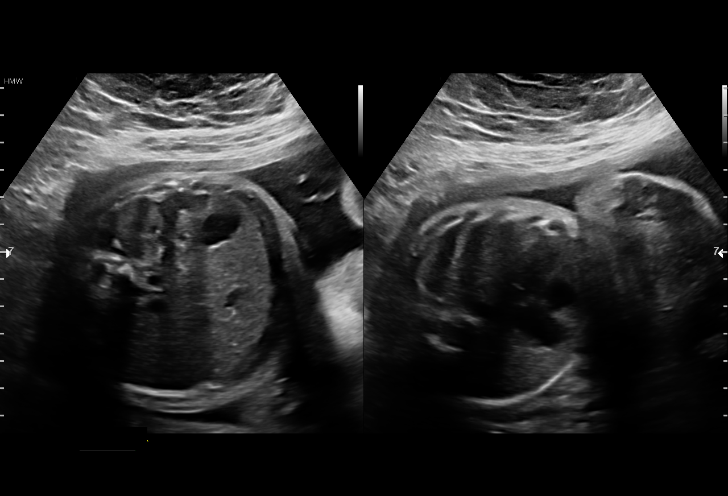
[im 16/23]
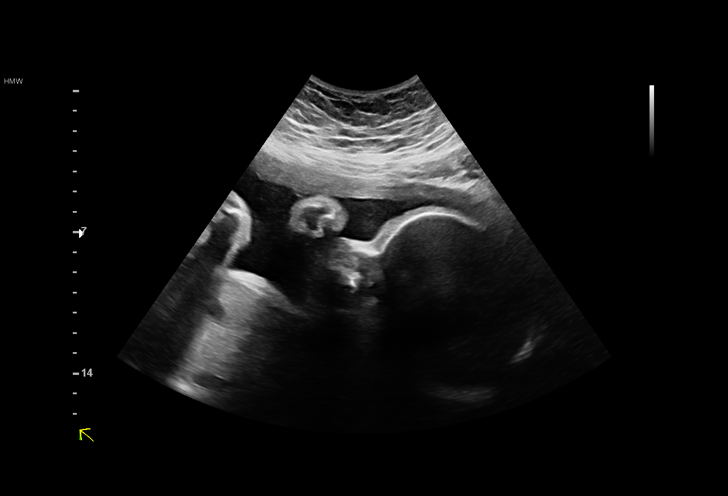
[im 18/23]
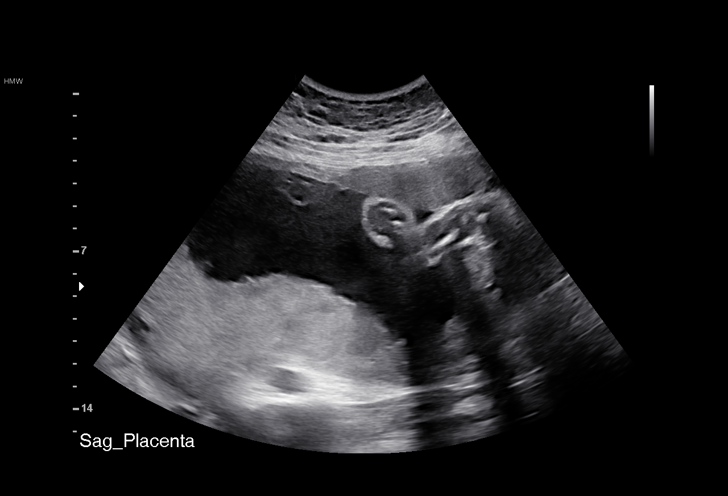
[im 19/23]
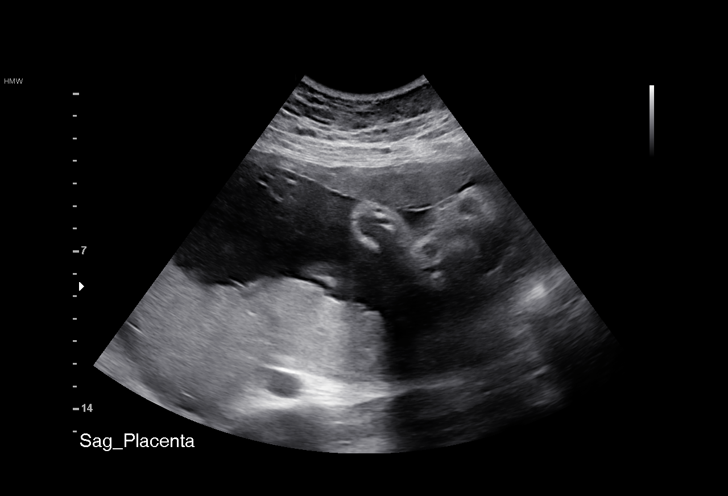
[im 21/23]
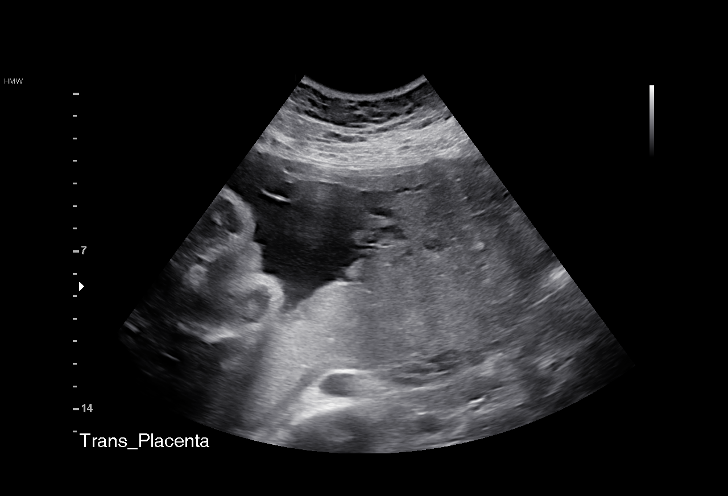
[im 23/23]
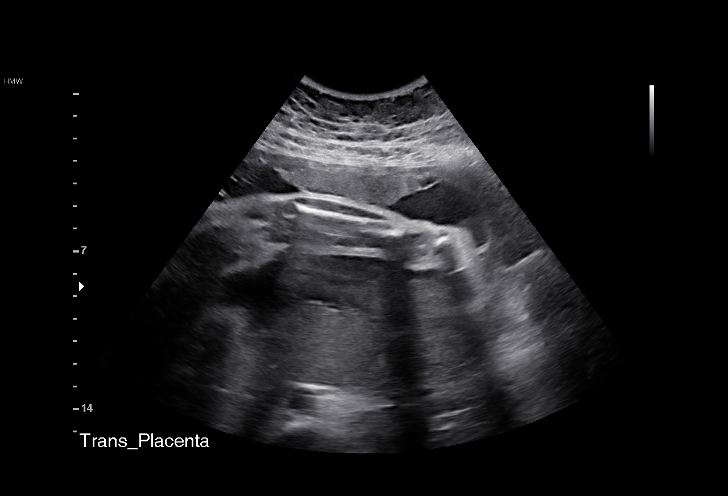

[14 of 23 positions shown; findings below may reference images not displayed]

----------------------------------------------------------------------

 ----------------------------------------------------------------------
Indications

  31 weeks gestation of pregnancy
  Obesity complicating pregnancy, third
  trimester
  Low Risk NIPS
 ----------------------------------------------------------------------
Vital Signs

                                                Height:        5'1"
Fetal Evaluation

 Num Of Fetuses:          1
 Fetal Heart              148
 Rate(bpm):
 Cardiac Activity:        Observed
 Presentation:            Cephalic
 Placenta:                Posterior
 P. Cord Insertion:       Previously Visualized

 Amniotic Fluid
 AFI FV:      Within normal limits

 AFI Sum(cm)     %Tile       Largest Pocket(cm)
 13.37           42

 RUQ(cm)       RLQ(cm)       LUQ(cm)        LLQ(cm)
 3.61          0
Biometry

 BPD:      81.4  mm     G. Age:  32w 5d         86  %    CI:          78.6  %    70 - 86
                                                         FL/HC:       21.0  %    19.3 -
 HC:      290.4  mm     G. Age:  32w 0d         41  %    HC/AC:       1.02       0.96 -
 AC:      284.4  mm     G. Age:  32w 3d         85  %    FL/BPD:      74.9  %    71 - 87
 FL:         61  mm     G. Age:  31w 5d         55  %    FL/AC:       21.4  %    20 - 24

 Est. FW:    7899   g      4 lb 4 oz     77  %
                    m
OB History

 Gravidity:    4         Term:   1         SAB:   2
 Living:       1
Gestational Age

 LMP:           31w 6d        Date:  01/07/18                 EDD:    10/14/18
 Clinical EDD:  31w 0d                                        EDD:    10/20/18
 U/S Today:     32w 2d                                        EDD:    10/11/18
 Best:          31w 0d     Det. By:  Clinical EDD             EDD:    10/20/18
Anatomy

 Cranium:               Appears normal         LVOT:                   Previously seen
 Cavum:                 Previously seen        Aortic Arch:            Previously seen
 Ventricles:            Previously seen        Ductal Arch:            Previously seen
 Choroid Plexus:        Previously seen        Diaphragm:              Previously seen
 Cerebellum:            Previously seen        Stomach:                Appears normal,
                                                                       left sided
 Posterior Fossa:       Previously seen        Abdomen:                Previously seen
 Nuchal Fold:           Previously seen        Abdominal Wall:         Previously seen
 Face:                  Orbits and profile     Cord Vessels:           Previously seen
                        previously seen
 Lips:                  Previously seen        Kidneys:                Appear normal
 Palate:                Previously seen        Bladder:                Appears normal
 Thoracic:              Appears normal         Spine:                  Previously seen
 Heart:                 Previously seen        Upper Extremities:      Previously seen
 RVOT:                  Previously seen        Lower Extremities:      Previously seen

 Other:  Male gender. Technically difficult due to fetal position and maternal
         body habitus. Nasal bone and heels previously visualized.
Cervix Uterus Adnexa

 Cervix
 Not visualized (advanced GA >98wks)
Impression

 Normal interval growth.
Recommendations

 Continue serial growth in 4 weeks given diagnosis if BMI >40

## 2020-10-01 ENCOUNTER — Emergency Department
Admission: EM | Admit: 2020-10-01 | Discharge: 2020-10-01 | Disposition: A | Discharge status: Home / Self Care | Payer: Medicaid Other | Attending: Emergency Medicine | Admitting: Emergency Medicine

## 2020-10-01 ENCOUNTER — Encounter: Payer: Self-pay | Admitting: Emergency Medicine

## 2020-10-01 DIAGNOSIS — J029 Acute pharyngitis, unspecified: Secondary | ICD-10-CM | POA: Insufficient documentation

## 2020-10-01 DIAGNOSIS — Z87891 Personal history of nicotine dependence: Secondary | ICD-10-CM | POA: Insufficient documentation

## 2020-10-01 LAB — GROUP A STREP BY PCR: Group A Strep by PCR: NOT DETECTED

## 2020-10-01 MED ORDER — AMOXICILLIN-POT CLAVULANATE 875-125 MG PO TABS
1.0000 | ORAL_TABLET | Freq: Two times a day (BID) | ORAL | 0 refills | Status: AC
Start: 2020-10-01 — End: 2020-10-08

## 2020-10-01 MED ORDER — AMOXICILLIN-POT CLAVULANATE 875-125 MG PO TABS
1.0000 | ORAL_TABLET | Freq: Once | ORAL | Status: AC
  Administered 2020-10-01: 1 via ORAL
  Filled 2020-10-01: qty 1

## 2020-10-01 NOTE — Discharge Instructions (Addendum)
Please take antibiotic as prescribed.  Follow-up with ENT as soon as possible.  Return to the emergency department for any worsening.

## 2020-10-01 NOTE — ED Notes (Signed)
Provided discharge instructions. Verbalized understanding. Alert, oriented and ambulatory upon discharge.

## 2020-10-01 NOTE — ED Triage Notes (Signed)
Pt c/o sore throat x3 days. White lesions seen on tonsils by Clinical research associate. Pt with hx/o reoccurring strep.

## 2020-10-01 NOTE — ED Notes (Signed)
Pt states coming in with a sore throat. Pt states "I think I have strep throat again."

## 2020-10-02 ENCOUNTER — Telehealth: Payer: Self-pay

## 2020-10-02 LAB — CHLAMYDIA/NGC RT PCR (ARMC ONLY)
Chlamydia Tr: NOT DETECTED
N gonorrhoeae: NOT DETECTED

## 2020-10-02 NOTE — Telephone Encounter (Signed)
Transition Care Management Unsuccessful Follow-up Telephone Call  Date of discharge and from where:  10/01/2020 from [facility].   Attempts:  1st Attempt  Reason for unsuccessful TCM follow-up call:  Left voice message

## 2020-10-02 NOTE — ED Provider Notes (Signed)
[facility] Emergency Department Provider Note  ____________________________________________   Event Date/Time   First MD Initiated Contact with Patient 10/01/20 2150     (approximate)  I have reviewed the triage vital signs and the nursing notes.   HISTORY  Chief Complaint Sore Throat  HPI Jodi Terrell is a 25 y.o. female who presents to the emergency department for evaluation of sore throat and feeling like her tonsils are swollen.  She states that she has been diagnosed with strep pharyngitis 3-4 times over the last year.  She states last episode was in December, treated at [facility].  She states at that time, her strep test was negative, and they swabbed her throat for GC and chlamydia and treated her presumptively for STDs as well as group A strep.  She reports that she had complete resolution of her symptoms until 3 days ago, when she began developing sore throat again.  She reports seeing white spots on her tonsils.  She denies fever, denies difficulty breathing or swallowing.  She denies other URI symptoms such as rhinorrhea, ear pain, cough.         Past Medical History:  Diagnosis Date  . Arrhythmia    with pneumonia  . Heart murmur    as a child  . Pneumonia    09/01/14- 3- 4 years ago  . Seasonal allergies     Patient Active Problem List   Diagnosis Date Noted  . Polyhydramnios in singleton pregnancy in third trimester 10/21/2018  . Polyhydramnios in third trimester 10/14/2018  . LGA (large for gestational age) fetus affecting management of mother, third trimester 10/14/2018  . Chlamydia trachomatis infection during pregnancy in third trimester 09/30/2018  . Group B streptococcal infection during pregnancy 09/27/2018  . Exposure to chlamydia 05/11/2018  . Rubella non-immune status, antepartum 03/30/2018  . Supervision of other normal pregnancy, antepartum 03/22/2018  . Maternal morbid obesity, antepartum (HCC) 11/30/2013    Past  Surgical History:  Procedure Laterality Date  . CLOSED REDUCTION NASAL FRACTURE N/A 09/01/2014   Procedure: CLOSED REDUCTION NASAL FRACTURE;  Surgeon: Glenna Fellows, MD;  Location: [facility] OR;  Service: Plastics;  Laterality: N/A;  . TYMPANOSTOMY TUBE PLACEMENT      Prior to Admission medications   Medication Sig Start Date End Date Taking? Authorizing Provider  amoxicillin-clavulanate (AUGMENTIN) 875-125 MG tablet Take 1 tablet by mouth 2 (two) times daily for 7 days. 10/01/20 10/08/20 Yes Rodgers, Ruben Gottron, PA  metroNIDAZOLE (FLAGYL) 500 MG tablet Take 1 tablet (500 mg total) by mouth 2 (two) times daily. 02/06/20   Lamptey, Britta Mccreedy, MD  Prenatal Vit-Fe Fumarate-FA (PRENATAL MULTIVITAMIN) TABS tablet Take 1 tablet by mouth daily at 12 noon.    [provider]    Allergies Patient has no known allergies.  Family History  Problem Relation Age of Onset  . Diabetes Maternal Grandfather   . Cancer Paternal Grandmother   . Diabetes Paternal Grandfather     Social History Social History   Tobacco Use  . Smoking status: Former Smoker    Types: Cigarettes    Quit date: 11/02/2010    Years since quitting: 9.9  . Smokeless tobacco: Never Used  Vaping Use  . Vaping Use: Never used  Substance Use Topics  . Alcohol use: No  . Drug use: Yes    Types: Marijuana    Review of Systems Constitutional: No fever/chills Eyes: No visual changes. ENT: + sore throat. Cardiovascular: Denies chest pain. Respiratory: Denies shortness  of breath. Gastrointestinal: No abdominal pain.  No nausea, no vomiting.  No diarrhea.  No constipation. Genitourinary: Negative for dysuria. Musculoskeletal: Negative for back pain. Skin: Negative for rash. Neurological: Negative for headaches, focal weakness or numbness.   ____________________________________________   PHYSICAL EXAM:  VITAL SIGNS: ED Triage Vitals  Enc Vitals Group     BP 10/01/20 2015 115/73     Pulse Rate 10/01/20 2015 95      Resp 10/01/20 2015 17     Temp 10/01/20 2015 99 F (37.2 C)     Temp Source 10/01/20 2015 Oral     SpO2 10/01/20 2015 100 %     Weight 10/01/20 2016 262 lb (118.8 kg)     Height --      Head Circumference --      Peak Flow --      Pain Score 10/01/20 2148 5     Pain Loc --      Pain Edu? --      Excl. in GC? --    Constitutional: Alert and oriented. Well appearing and in no acute distress. Eyes: Conjunctivae are normal. PERRL. EOMI. Head: Atraumatic. Nose: No congestion/rhinnorhea. Mouth/Throat: Mucous membranes are moist.  Oropharynx erythematous with tonsillar enlargement, 3+ bilaterally with white exudates bilaterally.  Airway remains patent. Neck: No stridor.   Lymphatic: There is bilateral cervical lymphadenopathy with tenderness noted. Cardiovascular: Normal rate, regular rhythm. Grossly normal heart sounds.  Good peripheral circulation. Respiratory: Normal respiratory effort.  No retractions. Lungs CTAB. Neurologic:  Normal speech and language. No gross focal neurologic deficits are appreciated. No gait instability. Skin:  Skin is warm, dry and intact. No rash noted. Psychiatric: Mood and affect are normal. Speech and behavior are normal.  ____________________________________________   LABS (all labs ordered are listed, but only abnormal results are displayed)  Labs Reviewed  GROUP A STREP BY PCR  CHLAMYDIA/NGC RT PCR ([facility] ONLY)    ____________________________________________   INITIAL IMPRESSION / ASSESSMENT AND PLAN / ED COURSE  As part of my medical decision making, I reviewed the following data within the electronic MEDICAL RECORD NUMBER Nursing notes reviewed and incorporated, Labs reviewed and Notes from prior ED visits        Patient is a 25 year old female who presents to the emergency department for evaluation of sore throat after reportedly being treated multiple times for strep throat over the last year.  See HPI for further details.  In triage,  the patient has normal vital signs, is not febrile or tachycardic.  On physical exam, she does have bilateral tonsillar swelling with exudate present lymphadenopathy.  No presence of a cough or fever.  Patient was tested for group A strep from triage and was found to be negative.  Patient does admit to oral sex since last testing and treatment in December at [facility], ER, will test throat for GC chlamydia as well.  These results are pending at this time.  After review of the patient's chart, it does look like she tested positive for beta-hemolytic strep, not group A strep when treated back in the early fall.  Will initiate treatment with Augmentin for possible strep pharyngitis.  Given recurrence of multiple infections over the course of this year, recommended close follow-up with ENT for evaluation.  There is no evidence of PTA at this time, no evidence of acute impending airway failure.  Feel the patient is safe and stable for outpatient follow-up.  Patient will return here with any acute worsening.  Strict  return precautions were discussed.      ____________________________________________   FINAL CLINICAL IMPRESSION(S) / ED DIAGNOSES  Final diagnoses:  Pharyngitis, unspecified etiology     ED Discharge Orders         Ordered    amoxicillin-clavulanate (AUGMENTIN) 875-125 MG tablet  2 times daily        10/01/20 2217          *Please note:  Jodi Terrell was evaluated in Emergency Department on 10/02/2020 for the symptoms described in the history of present illness. She was evaluated in the context of the global COVID-19 pandemic, which necessitated consideration that the patient might be at risk for infection with the SARS-CoV-2 virus that causes COVID-19. Institutional protocols and algorithms that pertain to the evaluation of patients at risk for COVID-19 are in a state of rapid change based on information released by regulatory bodies including the CDC and federal and state  organizations. These policies and algorithms were followed during the patient's care in the ED.  Some ED evaluations and interventions may be delayed as a result of limited staffing during and the pandemic.*   Note:  This document was prepared using Dragon voice recognition software and may include unintentional dictation errors.   Lucy Chris, PA 10/02/20 1546    Chesley Noon, MD 10/03/20 [phone]

## 2020-10-03 NOTE — Telephone Encounter (Signed)
Transition Care Management Follow-up Telephone Call  Date of discharge and from where: 10/01/2020 from [facility].   How have you been since you were released from the hospital? Pt states that she is feeling a little better today.   Any questions or concerns? No  Items Reviewed:  Did the pt receive and understand the discharge instructions provided? Yes   Medications obtained and verified? Yes   Other? No   Any new allergies since your discharge? No   Dietary orders reviewed? N/A  Do you have support at home? Yes   Functional Questionnaire: (I = Independent and D = Dependent) ADLs: I  Bathing/Dressing- I  Meal Prep- I  Eating- I  Maintaining continence- I  Transferring/Ambulation- I  Managing Meds- I   Follow up appointments reviewed:   Specialist Hospital f/u appt confirmed? Yes  10/09/2020 with ENT.  Are transportation arrangements needed? Yes   If their condition worsens, is the pt aware to call PCP or go to the Emergency Dept.? Yes  Was the patient provided with contact information for the PCP's office or ED? Yes  Was to pt encouraged to call back with questions or concerns? Yes

## 2020-10-09 DIAGNOSIS — J3501 Chronic tonsillitis: Secondary | ICD-10-CM | POA: Diagnosis not present

## 2020-10-09 DIAGNOSIS — J351 Hypertrophy of tonsils: Secondary | ICD-10-CM | POA: Diagnosis not present

## 2020-10-18 ENCOUNTER — Encounter: Payer: Self-pay | Admitting: Otolaryngology

## 2020-10-23 ENCOUNTER — Other Ambulatory Visit: Admission: RE | Admit: 2020-10-23 | Payer: Medicaid Other | Source: Ambulatory Visit

## 2020-10-23 ENCOUNTER — Emergency Department
Admission: EM | Admit: 2020-10-23 | Discharge: 2020-10-23 | Discharge status: Absent without leave | Payer: Medicaid Other

## 2020-10-24 ENCOUNTER — Other Ambulatory Visit: Payer: Self-pay

## 2020-10-24 ENCOUNTER — Other Ambulatory Visit
Admission: RE | Admit: 2020-10-24 | Discharge: 2020-10-24 | Disposition: A | Discharge status: Home / Self Care | Payer: Medicaid Other | Source: Ambulatory Visit | Attending: Otolaryngology | Admitting: Otolaryngology

## 2020-10-24 ENCOUNTER — Other Ambulatory Visit: Payer: Medicaid Other

## 2020-10-24 DIAGNOSIS — Z20822 Contact with and (suspected) exposure to covid-19: Secondary | ICD-10-CM | POA: Insufficient documentation

## 2020-10-24 DIAGNOSIS — Z01812 Encounter for preprocedural laboratory examination: Secondary | ICD-10-CM | POA: Diagnosis not present

## 2020-10-24 LAB — SARS CORONAVIRUS 2 (TAT 6-24 HRS): SARS Coronavirus 2: NEGATIVE

## 2020-10-25 ENCOUNTER — Ambulatory Visit: Payer: Medicaid Other | Admitting: Anesthesiology

## 2020-10-25 ENCOUNTER — Other Ambulatory Visit: Payer: Self-pay

## 2020-10-25 ENCOUNTER — Encounter
Admission: RE | Disposition: A | Discharge status: Home / Self Care | Payer: Self-pay | Source: Ambulatory Visit | Attending: Otolaryngology

## 2020-10-25 ENCOUNTER — Ambulatory Visit
Admission: RE | Admit: 2020-10-25 | Discharge: 2020-10-25 | Disposition: A | Discharge status: Home / Self Care | Payer: Medicaid Other | Source: Ambulatory Visit | Attending: Otolaryngology | Admitting: Otolaryngology

## 2020-10-25 ENCOUNTER — Encounter: Payer: Self-pay | Admitting: Otolaryngology

## 2020-10-25 DIAGNOSIS — J3501 Chronic tonsillitis: Secondary | ICD-10-CM | POA: Diagnosis not present

## 2020-10-25 DIAGNOSIS — J351 Hypertrophy of tonsils: Secondary | ICD-10-CM | POA: Diagnosis not present

## 2020-10-25 HISTORY — PX: TONSILLECTOMY: SHX5217

## 2020-10-25 LAB — POCT PREGNANCY, URINE: Preg Test, Ur: NEGATIVE

## 2020-10-25 SURGERY — TONSILLECTOMY
Anesthesia: General | Site: Mouth | Laterality: Bilateral

## 2020-10-25 MED ORDER — ONDANSETRON HCL 4 MG/2ML IJ SOLN
4.0000 mg | Freq: Once | INTRAMUSCULAR | Status: AC | PRN
  Administered 2020-10-25: 4 mg via INTRAVENOUS

## 2020-10-25 MED ORDER — LIDOCAINE HCL (CARDIAC) PF 100 MG/5ML IV SOSY
PREFILLED_SYRINGE | INTRAVENOUS | Status: DC | PRN
  Administered 2020-10-25: 50 mg via INTRAVENOUS

## 2020-10-25 MED ORDER — LACTATED RINGERS IV SOLN: INTRAVENOUS | Status: DC

## 2020-10-25 MED ORDER — SCOPOLAMINE 1 MG/3DAYS TD PT72
1.0000 | MEDICATED_PATCH | Freq: Once | TRANSDERMAL | Status: DC
  Administered 2020-10-25: 1.5 mg via TRANSDERMAL

## 2020-10-25 MED ORDER — GLYCOPYRROLATE 0.2 MG/ML IJ SOLN
INTRAMUSCULAR | Status: DC | PRN
  Administered 2020-10-25: .1 mg via INTRAVENOUS

## 2020-10-25 MED ORDER — FENTANYL CITRATE (PF) 100 MCG/2ML IJ SOLN
25.0000 ug | INTRAMUSCULAR | Status: DC | PRN
  Administered 2020-10-25: 50 ug via INTRAVENOUS

## 2020-10-25 MED ORDER — DEXAMETHASONE SODIUM PHOSPHATE 4 MG/ML IJ SOLN
INTRAMUSCULAR | Status: DC | PRN
  Administered 2020-10-25: 10 mg via INTRAVENOUS

## 2020-10-25 MED ORDER — OXYCODONE HCL 5 MG PO TABS: 5.0000 mg | ORAL_TABLET | Freq: Once | ORAL | Status: AC | PRN

## 2020-10-25 MED ORDER — PROPOFOL 10 MG/ML IV BOLUS
INTRAVENOUS | Status: DC | PRN
  Administered 2020-10-25: 140 mg via INTRAVENOUS

## 2020-10-25 MED ORDER — OXYCODONE HCL 5 MG/5ML PO SOLN
5.0000 mg | Freq: Once | ORAL | Status: AC | PRN
Start: 2020-10-25 — End: 2020-10-25
  Administered 2020-10-25: 5 mg via ORAL

## 2020-10-25 MED ORDER — SUCCINYLCHOLINE CHLORIDE 20 MG/ML IJ SOLN
INTRAMUSCULAR | Status: DC | PRN
  Administered 2020-10-25: 100 mg via INTRAVENOUS

## 2020-10-25 MED ORDER — MIDAZOLAM HCL 5 MG/5ML IJ SOLN
INTRAMUSCULAR | Status: DC | PRN
  Administered 2020-10-25: 2 mg via INTRAVENOUS

## 2020-10-25 MED ORDER — HYDROCODONE-ACETAMINOPHEN 7.5-325 MG/15ML PO SOLN: 15.0000 mL | ORAL | 0 refills | Status: AC | PRN

## 2020-10-25 MED ORDER — ACETAMINOPHEN 10 MG/ML IV SOLN
1000.0000 mg | Freq: Once | INTRAVENOUS | Status: AC
  Administered 2020-10-25: 1000 mg via INTRAVENOUS

## 2020-10-25 SURGICAL SUPPLY — 14 items
BLADE BOVIE TIP EXT 4 (BLADE) ×2 IMPLANT
CANISTER SUCT 1200ML W/VALVE (MISCELLANEOUS) ×2 IMPLANT
ELECT REM PT RETURN 9FT ADLT (ELECTROSURGICAL) ×2
ELECTRODE REM PT RTRN 9FT ADLT (ELECTROSURGICAL) ×1 IMPLANT
GLOVE PI ULTRA LF STRL 7.5 (GLOVE) ×1 IMPLANT
GLOVE PI ULTRA NON LATEX 7.5 (GLOVE) ×1
KIT TURNOVER KIT A (KITS) ×2 IMPLANT
PACK TONSIL AND ADENOID CUSTOM (PACKS) ×2 IMPLANT
PENCIL SMOKE EVACUATOR (MISCELLANEOUS) ×2 IMPLANT
SLEEVE SUCTION 125 (MISCELLANEOUS) ×2 IMPLANT
SOL ANTI-FOG 6CC FOG-OUT (MISCELLANEOUS) ×1 IMPLANT
SOL FOG-OUT ANTI-FOG 6CC (MISCELLANEOUS) ×1
SPONGE TONSIL .75 RFD DBL STRL (DISPOSABLE) ×1 IMPLANT
STRAP BODY AND KNEE 60X3 (MISCELLANEOUS) ×2 IMPLANT

## 2020-10-25 NOTE — Op Note (Signed)
10/25/2020  9:31 AM    Jodi Terrell  [id number]   Pre-Op Dx: Chronic tonsillitis, hypertrophied tonsils causing airway obstruction  Post-op Dx: Same  Proc: Tonsillectomy  Surg:  Beverly Sessions Brailyn Killion  Anes:  GOT  EBL: 30 mL  Comp: None  Findings: Very large tonsils on both sides that were mostly hidden behind the anterior tonsillar pillars.  These were cryptic as well.  Procedure: Patient was given general anesthesia by oral endotracheal intubation.  A [name] was used to visualize the oropharynx.  She had a very large tongue and had to push the tongue to one side and only be able to visualize 1 tonsil at a time.  The right tonsil was removed first and then the left tonsil.  The right tonsil was more inflamed and difficult to remove.  Once a [name] was in place the anterior pillar was incised electrocautery.  The tonsil was dissected with blunt dissection and electrocautery to free it up from the tonsil bed.  The tonsil had a lot of trouble extra tonsil tissue tags inferiorly along the base of tongue.  Some of this was trimmed off with electrocautery.  Bleeding was controlled with direct pressure and electrocautery.  The tongue blade was then readjusted so I could see the left tonsil.  The anterior pillar was incised and the tonsil was pulled away from its bed using blunt dissection and electrocautery dissected from the fossa.  This did not have the inferior tail along the tongue base as the opposite side did.  Once both tonsils were removed thin bleeding had been controlled on both sides electrocautery the area was revisited and I did not see any signs of any bleeding in either tonsil fossa.  The patient was awakened and taken to the recovery room in satisfactory condition.  There were no operative complications.  Dispo:   To PACU to be discharged home  Plan: To follow-up in the office in a couple weeks to make sure she is doing well.  She has given hydrocodone with  Tylenol liquid to use for pain.  She will push liquids at home to stay well-hydrated.  Beverly Sessions [name]  10/25/2020 9:31 AM

## 2020-10-25 NOTE — Transfer of Care (Signed)
Immediate Anesthesia Transfer of Care Note  Patient: Jodi Terrell  Procedure(s) Performed: TONSILLECTOMY (Bilateral Mouth)  Patient Location: PACU  Anesthesia Type: General  Level of Consciousness: awake, alert  and patient cooperative  Airway and Oxygen Therapy: Patient Spontanous Breathing and Patient connected to supplemental oxygen  Post-op Assessment: Post-op Vital signs reviewed, Patient's Cardiovascular Status Stable, Respiratory Function Stable, Patent Airway and No signs of Nausea or vomiting  Post-op Vital Signs: Reviewed and stable  Complications: No complications documented.

## 2020-10-25 NOTE — Discharge Instructions (Signed)
T & A INSTRUCTION SHEET - [facility]  PAUL JUENGEL, MD  [address] [place], [place] [address] [place].  [phone]  INFORMATION SHEET FOR A TONSILLECTOMY AND ADENDOIDECTOMY  About Your Tonsils and Adenoids  The tonsils and adenoids are normal body tissues that are part of our immune system.  They normally help to protect us against diseases that may enter our mouth and nose. However, sometimes the tonsils and/or adenoids become too large and obstruct our breathing, especially at night.    If either of these things happen it helps to remove the tonsils and adenoids in order to become healthier. The operation to remove the tonsils and adenoids is called a tonsillectomy and adenoidectomy.  The Location of Your Tonsils and Adenoids  The tonsils are located in the back of the throat on both side and sit in a cradle of muscles. The adenoids are located in the roof of the mouth, behind the nose, and closely associated with the opening of the Eustachian tube to the ear.  Surgery on Tonsils and Adenoids  A tonsillectomy and adenoidectomy is a short operation which takes about thirty minutes.  This includes being put to sleep and being awakened. Tonsillectomies and adenoidectomies are performed at [facility] and may require observation period in the recovery room prior to going home. Children are required to remain in recovery for at least 45 minutes.   Following the Operation for a Tonsillectomy  A cautery machine is used to control bleeding. Bleeding from a tonsillectomy and adenoidectomy is minimal and postoperatively the risk of bleeding is approximately four percent, although this rarely life threatening.  After your tonsillectomy and adenoidectomy post-op care at home: 1. Our patients are able to go home the same day. You may be given prescriptions for pain medications, if indicated. 2. It is extremely important to  remember that fluid intake is of utmost importance after a tonsillectomy. The amount that you drink must be maintained in the postoperative period. A good indication of whether a child is getting enough fluid is whether his/her urine output is constant. As long as children are urinating or wetting their diaper every 6 - 8 hours this is usually enough fluid intake.   3. Although rare, this is a risk of some bleeding in the first ten days after surgery. This usually occurs between day five and nine postoperatively. This risk of bleeding is approximately four percent. If you or your child should have any bleeding you should remain calm and notify our office or go directly to the emergency room at [facility] where they will contact us. Our doctors are available seven days a week for notification. We recommend sitting up quietly in a chair, place an ice pack on the front of the neck and spitting out the blood gently until we are able to contact you. Adults should gargle gently with ice water and this may help stop the bleeding. If the bleeding does not stop after a short time, i.e. 10 to 15 minutes, or seems to be increasing again, please contact us or go to the hospital.   4. It is common for the pain to be worse at 5 - 7 days postoperatively. This occurs because the "scab" is peeling off and the mucous membrane (skin of the throat) is growing back where the tonsils were.   5. It is common for a low-grade fever, less than 102, during the first week   after a tonsillectomy and adenoidectomy. It is usually due to not drinking enough liquids, and we suggest your use liquid Tylenol (acetaminophen) or the pain medicine with Tylenol (acetaminophen) prescribed in order to keep your temperature below 102. Please follow the directions on the back of the bottle. 6. Recommendations for post-operative pain in children and adults: a) For Children 12 and younger: Recommendations are for oral Tylenol  (acetaminophen) and oral Motrin (ibuprofen). Administer the Tylenol (acetaminophen) and Motrin as stated on bottle for patient's age/weight. Sometimes it may be necessary to alternate the Tylenol (acetaminophen) and Motrin for improved pain control. Motrin (ibuprofen) does last slightly longer so many patients benefit from being given this prior to bedtime. All children should avoid Aspirin products for 2 weeks following surgery. b) For children over the age of 12: Tylenol (acetaminophen) is the preferred first choice for pain control. Depending on your child's size, sometimes they will be given a combination of Tylenol (acetaminophen) and hydrocodone medication or sometimes it will be recommended they take Motrin (ibuprofen) in addition to the Tylenol (acetaminophen). Narcotics should always be used with caution in children following surgery as they can suppress their breathing and switching to over the counter Tylenol (acetaminophen) and Motrin (ibuprofen) as soon as possible is recommended. All patients should avoid Aspirin products for 2 weeks following surgery. c) Adults: Usually adults will require a narcotic pain medication following a tonsillectomy. This usually has either hydrocodone or oxycodone in it and can usually be taken every 4 to 6 hours as needed for moderate pain. If the medication does not have Tylenol (acetaminophen) in it, you may also supplement Tylenol (acetaminophen) as needed every 4 to 6 hours for breakthrough or mild pain. Adults should avoid Aspirin, Aleve, Motrin, and Ibuprofen products for 2 weeks following surgery as they can increase your risk of bleeding. 7. If you happen to look in the mirror or into your child's mouth you will see white/gray patches on the back of the throat. This is what a scab looks like in the mouth and is normal after having a tonsillectomy and adenoidectomy. They will disappear once the tonsil areas heal completely. However, it may cause a noticeable odor,  and this too will disappear with time.     8. You or your child may experience ear pain after having a tonsillectomy and adenoidectomy.  This is called referred pain and comes from the throat, but it is felt in the ears.  Ear pain is quite common and expected. It will usually go away after ten days. There is usually nothing wrong with the ears, and it is primarily due to the healing area stimulating the nerve to the ear that runs along the side of the throat. Use either the prescribed pain medicine or Tylenol (acetaminophen) as needed.  9. The throat tissues after a tonsillectomy are obviously sensitive. Smoking around children who have had a tonsillectomy significantly increases the risk of bleeding. DO NOT SMOKE!  What to Expect Each Day  First Day at Home 1. Patients will be discharged home the same day.  2. Drink at least four glasses of liquid a day. Clear, cool liquids are recommended. Fruit juices containing citric acid are not recommended because they tend to cause pain. Carbonated beverages are allowed if you pour them from glass to glass to remove the bubbles as these tend to cause discomfort. Avoid alcoholic beverages.  3. Eat very soft foods such as soups, broth, jello, custard, pudding, ice cream, popsicles, applesauce, mashed potatoes,   and in general anything that you can crush between your tongue and the roof of your mouth. Try adding [employer] Mix into your food for extra calories. It is not uncommon to lose 5 to 10 pounds of fluid weight. The weight will be gained back quickly once you're feeling better and drinking more.  4. Sleep with your head elevated on two pillows for about three days to help decrease the swelling.  5. DO NOT SMOKE!  Day Two  1. Rest as much as possible. Use common sense in your activities.  2. Continue drinking at least four glasses of liquid per day.  3. Follow the soft diet.  4. Use your pain medication as needed.  Day Three  1. Advance  your activity as you are able and continue to follow the previous day's suggestions.  Days Four Through Six  1. Advance your diet and begin to eat more solid foods such as chopped hamburger. 2. Advance your activities slowly. Children should be kept mostly around the house.  3. Not uncommonly, there will be more pain at this time. It is temporary, usually lasting a day or two.  Day Seven Through Ten  1. Most individuals by this time are able to return to work or school unless otherwise instructed. Consider sending children back to school for a half day on the first day back.  General Anesthesia, Adult, Care After This sheet gives you information about how to care for yourself after your procedure. Your health care provider may also give you more specific instructions. If you have problems or questions, contact your health care provider. What can I expect after the procedure? After the procedure, the following side effects are common:  Pain or discomfort at the IV site.  Nausea.  Vomiting.  Sore throat.  Trouble concentrating.  Feeling cold or chills.  Feeling weak or tired.  Sleepiness and fatigue.  Soreness and body aches. These side effects can affect parts of the body that were not involved in surgery. Follow these instructions at home: For the time period you were told by your health care provider:  Rest.  Do not participate in activities where you could fall or become injured.  Do not drive or use machinery.  Do not drink alcohol.  Do not take sleeping pills or medicines that cause drowsiness.  Do not make important decisions or sign legal documents.  Do not take care of children on your own.   Eating and drinking  Follow any instructions from your health care provider about eating or drinking restrictions.  When you feel hungry, start by eating small amounts of foods that are soft and easy to digest (bland), such as toast. Gradually return to your regular  diet.  Drink enough fluid to keep your urine pale yellow.  If you vomit, rehydrate by drinking water, juice, or clear broth. General instructions  If you have sleep apnea, surgery and certain medicines can increase your risk for breathing problems. Follow instructions from your health care provider about wearing your sleep device: ? Anytime you are sleeping, including during daytime naps. ? While taking prescription pain medicines, sleeping medicines, or medicines that make you drowsy.  Have a responsible adult stay with you for the time you are told. It is important to have someone help care for you until you are awake and alert.  Return to your normal activities as told by your health care provider. Ask your health care provider what activities are safe for you.  Take over-the-counter and prescription medicines only as told by your health care provider.  If you smoke, do not smoke without supervision.  Keep all follow-up visits as told by your health care provider. This is important. Contact a health care provider if:  You have nausea or vomiting that does not get better with medicine.  You cannot eat or drink without vomiting.  You have pain that does not get better with medicine.  You are unable to pass urine.  You develop a skin rash.  You have a fever.  You have redness around your IV site that gets worse. Get help right away if:  You have difficulty breathing.  You have chest pain.  You have blood in your urine or stool, or you vomit blood. Summary  After the procedure, it is common to have a sore throat or nausea. It is also common to feel tired.  Have a responsible adult stay with you for the time you are told. It is important to have someone help care for you until you are awake and alert.  When you feel hungry, start by eating small amounts of foods that are soft and easy to digest (bland), such as toast. Gradually return to your regular diet.  Drink  enough fluid to keep your urine pale yellow.  Return to your normal activities as told by your health care provider. Ask your health care provider what activities are safe for you. This information is not intended to replace advice given to you by your health care provider. Make sure you discuss any questions you have with your health care provider. Document Revised: 04/12/2020 Document Reviewed: 11/10/2019 Elsevier Patient Education  2021 Elsevier Inc.  Scopolamine skin patches What is this medicine? SCOPOLAMINE (skoe POL a meen) is used to prevent nausea and vomiting caused by motion sickness, anesthesia and surgery. This medicine may be used for other purposes; ask your health care provider or pharmacist if you have questions. COMMON BRAND NAME(S): Transderm Scop What should I tell my health care provider before I take this medicine? They need to know if you have any of these conditions:  are scheduled to have a gastric secretion test  glaucoma  heart disease  kidney disease  liver disease  lung or breathing disease, like asthma  mental illness  prostate disease  seizures  stomach or intestine problems  trouble passing urine  an unusual or allergic reaction to scopolamine, atropine, other medicines, foods, dyes, or preservatives  pregnant or trying to get pregnant  breast-feeding How should I use this medicine? This medicine is for external use only. Follow the directions on the prescription label. Wear only 1 patch at a time. Choose an area behind the ear, that is clean, dry, hairless and free from any cuts or irritation. Wipe the area with a clean dry tissue. Peel off the plastic backing of the skin patch, trying not to touch the adhesive side with your hands. Do not cut the patches. Firmly apply to the area you have chosen, with the metallic side of the patch to the skin and the tan-colored side showing. Once firmly in place, wash your hands well with soap and water.  Do not get this medicine into your eyes. After removing the patch, wash your hands and the area behind your ear thoroughly with soap and water. The patch will still contain some medicine after use. To avoid accidental contact or ingestion by children or pets, fold the used patch in half with the sticky  side together and throw away in the trash out of the reach of children and pets. If you need to use a second patch after you remove the first, place it behind the other ear. A special MedGuide will be given to you by the pharmacist with each prescription and refill. Be sure to read this information carefully each time. Talk to your pediatrician regarding the use of this medicine in children. Special care may be needed. Overdosage: If you think you have taken too much of this medicine contact a poison control center or emergency room at once. NOTE: This medicine is only for you. Do not share this medicine with others. What if I miss a dose? This does not apply. This medicine is not for regular use. What may interact with this medicine?  alcohol  antihistamines for allergy cough and cold  atropine  certain medicines for anxiety or sleep  certain medicines for bladder problems like oxybutynin, tolterodine  certain medicines for depression like amitriptyline, fluoxetine, sertraline  certain medicines for stomach problems like dicyclomine, hyoscyamine  certain medicines for Parkinson's disease like benztropine, trihexyphenidyl  certain medicines for seizures like phenobarbital, primidone  general anesthetics like halothane, isoflurane, methoxyflurane, propofol  ipratropium  local anesthetics like lidocaine, pramoxine, tetracaine  medicines that relax muscles for surgery  phenothiazines like chlorpromazine, mesoridazine, prochlorperazine, thioridazine  narcotic medicines for pain  other belladonna alkaloids This list may not describe all possible interactions. Give your health care  provider a list of all the medicines, herbs, non-prescription drugs, or dietary supplements you use. Also tell them if you smoke, drink alcohol, or use illegal drugs. Some items may interact with your medicine. What should I watch for while using this medicine? Limit contact with water while swimming and bathing because the patch may fall off. If the patch falls off, throw it away and put a new one behind the other ear. You may get drowsy or dizzy. Do not drive, use machinery, or do anything that needs mental alertness until you know how this medicine affects you. Do not stand or sit up quickly, especially if you are an older patient. This reduces the risk of dizzy or fainting spells. Alcohol may interfere with the effect of this medicine. Avoid alcoholic drinks. Your mouth may get dry. Chewing sugarless gum or sucking hard candy, and drinking plenty of water may help. Contact your healthcare professional if the problem does not go away or is severe. This medicine may cause dry eyes and blurred vision. If you wear contact lenses, you may feel some discomfort. Lubricating drops may help. See your healthcare professional if the problem does not go away or is severe. If you are going to need surgery, an MRI, CT scan, or other procedure, tell your healthcare professional that you are using this medicine. You may need to remove the patch before the procedure. What side effects may I notice from receiving this medicine? Side effects that you should report to your doctor or health care professional as soon as possible:  allergic reactions like skin rash, itching or hives; swelling of the face, lips, or tongue  blurred vision  changes in vision  confusion  dizziness  eye pain  fast, irregular heartbeat  hallucinations, loss of contact with reality  nausea, vomiting  pain or trouble passing urine  restlessness  seizures  skin irritation  stomach pain Side effects that usually do not  require medical attention (report to your doctor or health care professional if they continue or are  bothersome):  drowsiness  dry mouth  headache  sore throat This list may not describe all possible side effects. Call your doctor for medical advice about side effects. You may report side effects to FDA at 1-800-FDA-1088. Where should I keep my medicine? Keep out of the reach of children. Store at room temperature between 20 and 25 degrees C (68 and 77 degrees F). Keep this medicine in the foil package until ready to use. Throw away any unused medicine after the expiration date. NOTE: This sheet is a summary. It may not cover all possible information. If you have questions about this medicine, talk to your doctor, pharmacist, or health care provider.  2021 Elsevier/Gold Standard (2017-10-16 16:14:46)

## 2020-10-25 NOTE — Anesthesia Postprocedure Evaluation (Signed)
Anesthesia Post Note  Patient: Jodi Terrell  Procedure(s) Performed: TONSILLECTOMY (Bilateral Mouth)     Patient location during evaluation: PACU Anesthesia Type: General Level of consciousness: awake and alert Pain management: pain level controlled Vital Signs Assessment: post-procedure vital signs reviewed and stable Respiratory status: spontaneous breathing and nonlabored ventilation Cardiovascular status: blood pressure returned to baseline Postop Assessment: no apparent nausea or vomiting Anesthetic complications: no   No complications documented.  Deuce Paternoster [employer]

## 2020-10-25 NOTE — Anesthesia Preprocedure Evaluation (Signed)
Anesthesia Evaluation  Patient identified by MRN, date of birth, ID band Patient awake    Reviewed: Allergy & Precautions, NPO status , Patient's Chart, lab work & pertinent test results  Airway Mallampati: II  TM Distance: >3 FB Neck ROM: Full    Dental no notable dental hx.    Pulmonary former smoker,    Pulmonary exam normal        Cardiovascular negative cardio ROS Normal cardiovascular exam     Neuro/Psych negative neurological ROS  negative psych ROS   GI/Hepatic negative GI ROS, Neg liver ROS,   Endo/Other  negative endocrine ROSMorbid obesity  Renal/GU negative Renal ROS     Musculoskeletal negative musculoskeletal ROS (+)   Abdominal (+) + obese,   Peds  Hematology negative hematology ROS (+)   Anesthesia Other Findings Chronic tonsillitis  Reproductive/Obstetrics                             Anesthesia Physical Anesthesia Plan  ASA: III  Anesthesia Plan: General   Post-op Pain Management:    Induction: Intravenous  PONV Risk Score and Plan: 3 and Ondansetron, Dexamethasone and Treatment may vary due to age or medical condition  Airway Management Planned: Oral ETT  Additional Equipment: None  Intra-op Plan:   Post-operative Plan: Extubation in OR  Informed Consent: I have reviewed the patients History and Physical, chart, labs and discussed the procedure including the risks, benefits and alternatives for the proposed anesthesia with the patient or authorized representative who has indicated his/her understanding and acceptance.     Dental advisory given  Plan Discussed with: CRNA  Anesthesia Plan Comments:         Anesthesia Quick Evaluation

## 2020-10-25 NOTE — Anesthesia Procedure Notes (Signed)
Procedure Name: Intubation Date/Time: 10/25/2020 8:53 AM Performed by: Jimmy Picket, CRNA Pre-anesthesia Checklist: Patient identified, Emergency Drugs available, Suction available, Patient being monitored and Timeout performed Patient Re-evaluated:Patient Re-evaluated prior to induction Oxygen Delivery Method: Circle system utilized Preoxygenation: Pre-oxygenation with 100% oxygen Induction Type: IV induction Ventilation: Mask ventilation without difficulty Laryngoscope Size: Miller and 2 Grade View: Grade I Tube type: Oral Rae Tube size: 7.0 mm Number of attempts: 1 Placement Confirmation: ETT inserted through vocal cords under direct vision,  positive ETCO2 and breath sounds checked- equal and bilateral Tube secured with: Tape Dental Injury: Teeth and Oropharynx as per pre-operative assessment

## 2020-10-25 NOTE — H&P (Signed)
H&P has been reviewed and patient reevaluated, no changes necessary. To be downloaded later.  

## 2020-10-26 ENCOUNTER — Encounter: Payer: Self-pay | Admitting: Otolaryngology

## 2020-10-26 LAB — SURGICAL PATHOLOGY

## 2020-12-16 ENCOUNTER — Other Ambulatory Visit: Payer: Self-pay

## 2020-12-16 ENCOUNTER — Encounter (HOSPITAL_COMMUNITY): Payer: Self-pay

## 2020-12-16 ENCOUNTER — Emergency Department (HOSPITAL_COMMUNITY)
Admission: EM | Admit: 2020-12-16 | Discharge: 2020-12-16 | Disposition: A | Discharge status: Home / Self Care | Payer: Medicaid Other | Attending: Emergency Medicine | Admitting: Emergency Medicine

## 2020-12-16 ENCOUNTER — Emergency Department (HOSPITAL_COMMUNITY): Payer: Medicaid Other

## 2020-12-16 DIAGNOSIS — K922 Gastrointestinal hemorrhage, unspecified: Secondary | ICD-10-CM | POA: Insufficient documentation

## 2020-12-16 DIAGNOSIS — Z87891 Personal history of nicotine dependence: Secondary | ICD-10-CM | POA: Diagnosis not present

## 2020-12-16 DIAGNOSIS — K625 Hemorrhage of anus and rectum: Secondary | ICD-10-CM | POA: Diagnosis present

## 2020-12-16 LAB — COMPREHENSIVE METABOLIC PANEL
ALT: 15 U/L (ref 0–44)
AST: 16 U/L (ref 15–41)
Albumin: 3.7 g/dL (ref 3.5–5.0)
Alkaline Phosphatase: 90 U/L (ref 38–126)
Anion gap: 5 (ref 5–15)
BUN: 8 mg/dL (ref 6–20)
CO2: 24 mmol/L (ref 22–32)
Calcium: 8.9 mg/dL (ref 8.9–10.3)
Chloride: 109 mmol/L (ref 98–111)
Creatinine, Ser: 0.79 mg/dL (ref 0.44–1.00)
GFR, Estimated: >60 mL/min (ref >60)
Glucose, Bld: 108 mg/dL — ABNORMAL HIGH (ref 70–99)
Potassium: 4.1 mmol/L (ref 3.5–5.1)
Sodium: 138 mmol/L (ref 135–145)
Total Bilirubin: 0.3 mg/dL (ref 0.3–1.2)
Total Protein: 6.7 g/dL (ref 6.5–8.1)

## 2020-12-16 LAB — CBC
HCT: 37.8 % (ref 36.0–46.0)
Hemoglobin: 12 g/dL (ref 12.0–15.0)
MCH: 27.2 pg (ref 26.0–34.0)
MCHC: 31.7 g/dL (ref 30.0–36.0)
MCV: 85.7 fL (ref 80.0–100.0)
Platelets: 299 10*3/uL (ref 150–400)
RBC: 4.41 MIL/uL (ref 3.87–5.11)
RDW: 13.2 % (ref 11.5–15.5)
WBC: 11 10*3/uL — ABNORMAL HIGH (ref 4.0–10.5)
nRBC: 0 % (ref 0.0–0.2)

## 2020-12-16 LAB — POC OCCULT BLOOD, ED: Fecal Occult Bld: POSITIVE — AB

## 2020-12-16 LAB — I-STAT BETA HCG BLOOD, ED (MC, WL, AP ONLY): I-stat hCG, quantitative: <5 m[IU]/mL (ref <5)

## 2020-12-16 MED ORDER — IOHEXOL 300 MG/ML  SOLN
75.0000 mL | Freq: Once | INTRAMUSCULAR | Status: AC | PRN
  Administered 2020-12-16: 75 mL via INTRAVENOUS

## 2020-12-16 MED ORDER — HYDROCORTISONE ACETATE 25 MG RE SUPP: 25.0000 mg | Freq: Two times a day (BID) | RECTAL | 0 refills | Status: DC

## 2020-12-16 NOTE — ED Triage Notes (Signed)
Patient reports she went to the bathroom tonight and noticed bright red blood in the toilet and when she wiped, also with clots, states she has a hx of hemorrhoids.

## 2020-12-16 NOTE — ED Provider Notes (Signed)
[facility] EMERGENCY DEPARTMENT Provider Note   CSN: [id number] Arrival date & time: 12/16/20  0344     History Chief Complaint  Patient presents with  . Rectal Bleeding    Jodi Terrell is a 25 y.o. female.  Presents to the emergency department for evaluation of rectal bleeding.  Patient reports that she had a bowel movement today and noticed fair amount of bright red blood and clots in the toilet after the bowel movement.  Patient reports that she noticed pain passing the bowel movement but is not having any pain currently.  No abdominal pain.        Past Medical History:  Diagnosis Date  . Arrhythmia    with pneumonia  . Heart murmur    as a child  . Pneumonia    09/01/14- 3- 4 years ago  . Seasonal allergies     Patient Active Problem List   Diagnosis Date Noted  . Polyhydramnios in singleton pregnancy in third trimester 10/21/2018  . Polyhydramnios in third trimester 10/14/2018  . LGA (large for gestational age) fetus affecting management of mother, third trimester 10/14/2018  . Chlamydia trachomatis infection during pregnancy in third trimester 09/30/2018  . Group B streptococcal infection during pregnancy 09/27/2018  . Exposure to chlamydia 05/11/2018  . Rubella non-immune status, antepartum 03/30/2018  . Supervision of other normal pregnancy, antepartum 03/22/2018  . Maternal morbid obesity, antepartum (HCC) 11/30/2013    Past Surgical History:  Procedure Laterality Date  . CLOSED REDUCTION NASAL FRACTURE N/A 09/01/2014   Procedure: CLOSED REDUCTION NASAL FRACTURE;  Surgeon: Glenna Fellows, MD;  Location: [facility] OR;  Service: Plastics;  Laterality: N/A;  . TONSILLECTOMY Bilateral 10/25/2020   Procedure: TONSILLECTOMY;  Surgeon: Vernie Murders, MD;  Location: [facility];  Service: ENT;  Laterality: Bilateral;  . TYMPANOSTOMY TUBE PLACEMENT       OB History    Gravida  4   Para  2   Term  2   Preterm      AB  2   Living   2     SAB  2   IAB      Ectopic      Multiple  0   Live Births  2           Family History  Problem Relation Age of Onset  . Diabetes Maternal Grandfather   . Cancer Paternal Grandmother   . Diabetes Paternal Grandfather     Social History   Tobacco Use  . Smoking status: Former Smoker    Types: Cigarettes    Quit date: 11/02/2010    Years since quitting: 10.1  . Smokeless tobacco: Never Used  Vaping Use  . Vaping Use: Never used  Substance Use Topics  . Alcohol use: No  . Drug use: Not Currently    Types: Marijuana    Home Medications Prior to Admission medications   Medication Sig Start Date End Date Taking? Authorizing Provider  hydrocortisone (ANUSOL-HC) 25 MG suppository Place 1 suppository (25 mg total) rectally 2 (two) times daily. For 7 days 12/16/20  Yes Nimah Uphoff, Canary Brim, MD  cetirizine (ZYRTEC) 10 MG tablet Take 10 mg by mouth daily.    [provider]  metroNIDAZOLE (FLAGYL) 500 MG tablet Take 1 tablet (500 mg total) by mouth 2 (two) times daily. Patient not taking: Reported on 10/18/2020 02/06/20   Merrilee Jansky, MD  Prenatal Vit-Fe Fumarate-FA (PRENATAL MULTIVITAMIN) TABS tablet Take 1 tablet by mouth  daily at 12 noon. Patient not taking: Reported on 10/18/2020    [provider]    Allergies    Patient has no known allergies.  Review of Systems   Review of Systems  Gastrointestinal: Positive for blood in stool and rectal pain.  All other systems reviewed and are negative.   Physical Exam Updated Vital Signs BP 116/67   Pulse 77   Temp 98.1 F (36.7 C) (Oral)   Resp 16   Ht 5\' 1"  (1.549 m)   Wt 117.9 kg   SpO2 97%   BMI 49.13 kg/m   Physical Exam Vitals and nursing note reviewed.  Constitutional:      General: She is not in acute distress.    Appearance: Normal appearance. She is well-developed.  HENT:     Head: Normocephalic and atraumatic.     Right Ear: Hearing normal.     Left Ear: Hearing  normal.     Nose: Nose normal.  Eyes:     Conjunctiva/sclera: Conjunctivae normal.     Pupils: Pupils are equal, round, and reactive to light.  Cardiovascular:     Rate and Rhythm: Regular rhythm.     Heart sounds: S1 normal and S2 normal. No murmur heard. No friction rub. No gallop.   Pulmonary:     Effort: Pulmonary effort is normal. No respiratory distress.     Breath sounds: Normal breath sounds.  Chest:     Chest wall: No tenderness.  Abdominal:     General: Bowel sounds are normal.     Palpations: Abdomen is soft.     Tenderness: There is no abdominal tenderness. There is no guarding or rebound. Negative signs include Murphy's sign and McBurney's sign.     Hernia: No hernia is present.  Musculoskeletal:        General: Normal range of motion.     Cervical back: Normal range of motion and neck supple.  Skin:    General: Skin is warm and dry.     Findings: No rash.  Neurological:     Mental Status: She is alert and oriented to person, place, and time.     GCS: GCS eye subscore is 4. GCS verbal subscore is 5. GCS motor subscore is 6.     Cranial Nerves: No cranial nerve deficit.     Sensory: No sensory deficit.     Coordination: Coordination normal.  Psychiatric:        Speech: Speech normal.        Behavior: Behavior normal.        Thought Content: Thought content normal.     ED Results / Procedures / Treatments   Labs (all labs ordered are listed, but only abnormal results are displayed) Labs Reviewed  COMPREHENSIVE METABOLIC PANEL - Abnormal; Notable for the following components:      Result Value   Glucose, Bld 108 (*)    All other components within normal limits  CBC - Abnormal; Notable for the following components:   WBC 11.0 (*)    All other components within normal limits  POC OCCULT BLOOD, ED - Abnormal; Notable for the following components:   Fecal Occult Bld POSITIVE (*)    All other components within normal limits  I-STAT BETA HCG BLOOD, ED ([facility], [facility],  [facility] ONLY)    EKG None  Radiology CT ABDOMEN PELVIS W CONTRAST  Result Date: 12/16/2020 CLINICAL DATA:  bright blood from her rectum x few months. Burning sensations also. No hx  of sx or cancer. EXAM: CT ABDOMEN AND PELVIS WITH CONTRAST TECHNIQUE: Multidetector CT imaging of the abdomen and pelvis was performed using the standard protocol following bolus administration of intravenous contrast. CONTRAST:  22mL OMNIPAQUE IOHEXOL 300 MG/ML  SOLN COMPARISON:  CT abdomen pelvis 01/22/2016. FINDINGS: Lower chest: No acute abnormality. Hepatobiliary: No focal liver abnormality. Multiple subcentimeter calcified gallstone noted within the gallbladder lumen. No gallbladder wall thickening or pericholecystic fluid. No biliary dilatation. Pancreas: No focal lesion. Normal pancreatic contour. No surrounding inflammatory changes. No main pancreatic ductal dilatation. Spleen: Normal in size without focal abnormality. Adrenals/Urinary Tract: No adrenal nodule bilaterally. Bilateral kidneys enhance symmetrically. No hydronephrosis. No hydroureter. The urinary bladder is unremarkable. Stomach/Bowel: Stomach is within normal limits. No evidence of bowel wall thickening or dilatation. Appendix appears normal. Vascular/Lymphatic: No significant vascular findings are present. No enlarged abdominal or pelvic lymph nodes. Reproductive: Uterus and bilateral adnexa are unremarkable. Other: No intraperitoneal free fluid. No intraperitoneal free gas. No organized fluid collection. Musculoskeletal: Small fat containing umbilical hernia. No acute or significant osseous findings. IMPRESSION: 1. No acute intra-abdominal or intrapelvic abnormality to explain etiology of patient's symptoms. 2. Cholelithiasis with no acute cholecystitis. Electronically Signed   By: Tish Frederickson M.D.   On: 12/16/2020 05:59    Procedures Procedures   Medications Ordered in ED Medications  iohexol (OMNIPAQUE) 300 MG/ML solution 75 mL (75 mLs Intravenous  Contrast Given 12/16/20 0536)    ED Course  I have reviewed the triage vital signs and the nursing notes.  Pertinent labs & imaging results that were available during my care of the patient were reviewed by me and considered in my medical decision making (see chart for details).    MDM Rules/Calculators/A&P                          Patient presents to the emergency department for evaluation of rectal pain and rectal bleeding.  Patient had passage of bright red blood and some clots with a bowel movement tonight.  She does report a history of hemorrhoids.  Patient is stable at arrival.  Vital signs are normal.  No anemia.  No hypotension.  CT abdomen and pelvis does not show any evidence of colitis or other inflammatory changes.  Will treat with Anusol HC suppository, follow-up with GI.  Final Clinical Impression(s) / ED Diagnoses Final diagnoses:  Lower GI bleed    Rx / DC Orders ED Discharge Orders         Ordered    hydrocortisone (ANUSOL-HC) 25 MG suppository  2 times daily        12/16/20 0644    Ambulatory referral to Gastroenterology        12/16/20 0645           Gilda Crease, MD 12/16/20 [phone]

## 2020-12-17 ENCOUNTER — Telehealth: Payer: Self-pay

## 2020-12-17 NOTE — Telephone Encounter (Signed)
Transition Care Management Follow-up Telephone Call  Date of discharge and from where: 12/16/2020 from[facility]  How have you been since you were released from the hospital? Pt stated that she is feeling much better today.   Any questions or concerns? No  Items Reviewed:  Did the pt receive and understand the discharge instructions provided? Yes   Medications obtained and verified? Yes   Other? No   Any new allergies since your discharge? No   Dietary orders reviewed? n/a  Do you have support at home? Yes   Functional Questionnaire: (I = Independent and D = Dependent) ADLs: I  Bathing/Dressing- I  Meal Prep- I  Eating- I  Maintaining continence- I  Transferring/Ambulation- I  Managing Meds- I   Follow up appointments reviewed:   PCP Hospital f/u appt confirmed? No    Specialist Hospital f/u appt confirmed? No    Are transportation arrangements needed? No   If their condition worsens, is the pt aware to call PCP or go to the Emergency Dept.? Yes  Was the patient provided with contact information for the PCP's office or ED? Yes  Was to pt encouraged to call back with questions or concerns? Yes

## 2021-01-24 ENCOUNTER — Ambulatory Visit: Payer: Medicaid Other | Admitting: Physician Assistant

## 2021-01-28 ENCOUNTER — Encounter: Payer: Self-pay | Admitting: Gastroenterology

## 2021-03-04 ENCOUNTER — Ambulatory Visit: Payer: Medicaid Other | Admitting: Gastroenterology

## 2021-05-15 NOTE — Progress Notes (Signed)
Subjective:    Patient ID: Jodi Terrell, female    DOB: 1995/11/23, 25 y.o.   MRN: [id number]  HPI: Jodi Terrell is a 25 y.o. female presenting for new patient visit to establish care.  Introduced to Publishing rights manager role and practice setting.  All questions answered.  Discussed provider/patient relationship and expectations.  Chief Complaint  Patient presents with   Establish Care   Abdominal Pain   Patient is a single mom, she has a son who is 3 years on and a daughter who is 49 years old.  It sounds like she has had some tough relationships in the past with her childrens' fathers.  She is no longer with either of them and is not interested in having a relationship right now.  She has had an on and off relationship with her mom for years; currently her mom helps her with her children when she is working.  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   ABDOMINAL PAIN  Patient reports abdominal pain for years.  She was referred to GI in the past but never went.  She does have to strain a lot to have a bowel movement.  Does report blood in her stool frequently.  She also has diarrhea at times.  Duration: years Onset: gradual Severity: moderate to severe Quality: cramping Location:  diffuse  Episode duration: hours Radiation: no Frequency: a couple times weekly Fever: no Nausea: yes Vomiting: no Weight loss: no Decreased appetite: no Diarrhea: yes; at times Constipation: yes; at times Blood in stool: yes Heartburn: no Jaundice: no Rash: no Dysuria/urinary frequency: no Hematuria: no History of sexually transmitted disease: yes Recurrent NSAID use: no  Water intake: 48 oz   Patient is also concerned about her weight.  She recently joined the [facility] and started going with her children.  She is interested in speaking with a Dietician.    No Known Allergies  Outpatient Encounter Medications as of 05/16/2021  Medication Sig    cetirizine (ZYRTEC) 10 MG tablet Take 10 mg by mouth daily.   [DISCONTINUED] hydrocortisone (ANUSOL-HC) 25 MG suppository Place 1 suppository (25 mg total) rectally 2 (two) times daily. For 7 days   [DISCONTINUED] hydrocortisone (ANUSOL-HC) 25 MG suppository Place 1 suppository (25 mg total) rectally 2 (two) times daily. For 7 days   [DISCONTINUED] metroNIDAZOLE (FLAGYL) 500 MG tablet Take 1 tablet (500 mg total) by mouth 2 (two) times daily. (Patient not taking: Reported on 10/18/2020)   [DISCONTINUED] Prenatal Vit-Fe Fumarate-FA (PRENATAL MULTIVITAMIN) TABS tablet Take 1 tablet by mouth daily at 12 noon. (Patient not taking: Reported on 10/18/2020)   No facility-administered encounter medications on file as of 05/16/2021.    Active Ambulatory Problems    Diagnosis Date Noted   BMI 45.0-49.9, adult (HCC) 05/24/2021   Hemorrhoids 05/24/2021   Generalized abdominal pain 05/24/2021   Resolved Ambulatory Problems    Diagnosis Date Noted   Supervision of normal first teen pregnancy 11/01/2013   Maternal morbid obesity, antepartum (HCC) 11/30/2013   Abnormal antenatal AFP screen 12/07/2013   Hemorrhage affecting pregnancy in second trimester 01/25/2014   Premature rupture of membranes 05/21/2014   Supervision of other normal pregnancy, antepartum 03/22/2018   Rubella non-immune status, antepartum 03/30/2018   Exposure to chlamydia 05/11/2018   Group B streptococcal infection during pregnancy 09/27/2018   Chlamydia trachomatis infection during pregnancy in third trimester 09/30/2018   Polyhydramnios in third trimester 10/14/2018  LGA (large for gestational age) fetus affecting management of mother, third trimester 10/14/2018   Polyhydramnios in singleton pregnancy in third trimester 10/21/2018   Past Medical History:  Diagnosis Date   Arrhythmia    Heart murmur    Pneumonia    Seasonal allergies     Past Medical History:  Diagnosis Date   Arrhythmia    with pneumonia   Chlamydia  trachomatis infection during pregnancy in third trimester 09/30/2018   Exposure to chlamydia 05/11/2018   Partner tested positive  Treated in MAU 05/11/18   Group B streptococcal infection during pregnancy 09/27/2018   Treat in labor   Heart murmur    as a child   LGA (large for gestational age) fetus affecting management of mother, third trimester 10/14/2018   Maternal morbid obesity, antepartum (HCC) 11/30/2013   Recommendations [x]  Aspirin 81 mg daily after 12 weeks; discontinue after 36 weeks [x]  Nutrition consult [ ]  Weight gain 11-20 lbs for singleton and 25-35 lbs for twin pregnancy (IOM guidelines)  Higher class of obesity patients recommended to gain closer to lower limit   Weight loss is associated with adverse outcomes [ ]  Baseline and surveillance labs (pulled in from EPIC, refresh links as nee   Pneumonia    09/01/14- 3- 4 years ago   Polyhydramnios in singleton pregnancy in third trimester 10/21/2018   Polyhydramnios in third trimester 10/14/2018   Rubella non-immune status, antepartum 03/30/2018   Seasonal allergies    Supervision of other normal pregnancy, antepartum 03/22/2018    Nursing Staff Provider  Office Location [facility]  Dating  6 week sono  Language  English Anatomy 12/21/2018   Normal  Flu Vaccine  05/17/2018 Genetic Screen  NIPS: low risks   AFP: neg    TDaP vaccine   09/08/2018 Hgb A1C or  GTT Early A1c 4.9 Third trimester nl 2 hour  Rhogam     LAB RESULTS   Feeding Plan Breast Blood Type O/Positive/-- (08/12 1113)   Contraception nexplanon Antibody Negative (08/12 11    Past Surgical History:  Procedure Laterality Date   CLOSED REDUCTION NASAL FRACTURE N/A 09/01/2014   Procedure: CLOSED REDUCTION NASAL FRACTURE;  Surgeon: 09/10/2018, MD;  Location: [facility] OR;  Service: Plastics;  Laterality: N/A;   TONSILLECTOMY Bilateral 10/25/2020   Procedure: TONSILLECTOMY;  Surgeon: 04-06-1980, MD;  Location: [facility];  Service: ENT;  Laterality: Bilateral;   TYMPANOSTOMY TUBE  PLACEMENT      Social History   Tobacco Use   Smoking status: Former    Types: Cigarettes    Quit date: 11/02/2010    Years since quitting: 10.5   Smokeless tobacco: Never  Vaping Use   Vaping Use: Never used  Substance Use Topics   Alcohol use: No   Drug use: Not Currently    Types: Marijuana    Family History  Problem Relation Age of Onset   Diabetes Maternal Grandfather    Cancer Paternal Grandmother    Diabetes Paternal Grandfather     Review of Systems Per HPI unless specifically indicated above     Objective:    BP 122/86   Pulse 91   Temp 98.6 F (37 C) (Oral)   Ht 5\' 3"  (1.6 m)   Wt 265 lb 12.8 oz (120.6 kg)   SpO2 98%   BMI 47.08 kg/m   Wt Readings from Last 3 Encounters:  05/16/21 265 lb 12.8 oz (120.6 kg)  12/16/20 260 lb (117.9 kg)  10/25/20 266 lb (  120.7 kg)    Physical Exam Vitals and nursing note reviewed.  Constitutional:      General: She is not in acute distress.    Appearance: She is well-developed. She is obese. She is not toxic-appearing.  HENT:     Head: Normocephalic and atraumatic.     Mouth/Throat:     Mouth: Mucous membranes are moist.     Pharynx: No pharyngeal swelling or oropharyngeal exudate.  Eyes:     General: No scleral icterus.    Extraocular Movements: Extraocular movements intact.     Pupils: Pupils are equal, round, and reactive to light.  Cardiovascular:     Rate and Rhythm: Normal rate and regular rhythm.     Heart sounds: Normal heart sounds. No murmur heard. Pulmonary:     Effort: Pulmonary effort is normal. No respiratory distress.     Breath sounds: Normal breath sounds. No wheezing, rhonchi or rales.  Abdominal:     General: Abdomen is flat. Bowel sounds are normal. There is no distension.     Palpations: Abdomen is soft.     Tenderness: There is no abdominal tenderness. There is no right CVA tenderness, left CVA tenderness or guarding.  Skin:    General: Skin is warm and dry.     Capillary Refill:  Capillary refill takes less than 2 seconds.     Coloration: Skin is not cyanotic, jaundiced or pale.  Neurological:     General: No focal deficit present.     Mental Status: She is alert and oriented to person, place, and time.  Psychiatric:        Mood and Affect: Mood normal. Mood is not anxious or depressed.        Behavior: Behavior normal.      Assessment & Plan:   Problem List Items Addressed This Visit       Cardiovascular and Mediastinum   Hemorrhoids    Suspect related to constipation.  Start hydrocortisone suppositories.  Referral placed to GI.       Relevant Orders   Ambulatory referral to Gastroenterology   CBC with Differential/Platelet (Completed)     Other   Generalized abdominal pain    Chronic.  Has constipation, diarrhea, and rectal bleeding.  Unclear etiology, however differentials include IBS vs. IBD. Given rectal bleeding with hemorrhoids, will refer to GI to discuss possible further work up. Discussed elimination diet of gluten/dairy in the meantime.  Will also check CBC, TSH, electrolytes with kidney and liver function.  Follow up pending blood work.      BMI 45.0-49.9, adult [facility])    Patient is interested in talking with Dietician to help with food choices.  Referral placed.  Also discussed physical activity - goal is 30 minutes 5 times weekly.       Relevant Orders   COMPLETE METABOLIC PANEL WITH GFR (Completed)   TSH (Completed)   Amb ref to Medical Nutrition Therapy-MNT   Other Visit Diagnoses     Encounter to establish care    -  Primary   Need for hepatitis C screening test       Relevant Orders   Hepatitis C antibody (Completed)        Follow up plan: Return for pending lab work.

## 2021-05-16 ENCOUNTER — Other Ambulatory Visit: Payer: Self-pay

## 2021-05-16 ENCOUNTER — Other Ambulatory Visit: Payer: Self-pay | Admitting: Nurse Practitioner

## 2021-05-16 ENCOUNTER — Ambulatory Visit (INDEPENDENT_AMBULATORY_CARE_PROVIDER_SITE_OTHER): Payer: Medicaid Other | Admitting: Nurse Practitioner

## 2021-05-16 ENCOUNTER — Encounter: Payer: Self-pay | Admitting: Nurse Practitioner

## 2021-05-16 VITALS — BP 122/86 | HR 91 | Temp 98.6°F | Ht 63.0 in | Wt 265.8 lb

## 2021-05-16 DIAGNOSIS — K649 Unspecified hemorrhoids: Secondary | ICD-10-CM

## 2021-05-16 DIAGNOSIS — Z7689 Persons encountering health services in other specified circumstances: Secondary | ICD-10-CM | POA: Diagnosis not present

## 2021-05-16 DIAGNOSIS — Z1159 Encounter for screening for other viral diseases: Secondary | ICD-10-CM

## 2021-05-16 DIAGNOSIS — R1084 Generalized abdominal pain: Secondary | ICD-10-CM

## 2021-05-16 DIAGNOSIS — Z6841 Body Mass Index (BMI) 40.0 and over, adult: Secondary | ICD-10-CM | POA: Diagnosis not present

## 2021-05-16 MED ORDER — HYDROCORTISONE ACETATE 25 MG RE SUPP: 25.0000 mg | Freq: Two times a day (BID) | RECTAL | 0 refills | Status: DC

## 2021-05-17 LAB — HEPATITIS C ANTIBODY
Hepatitis C Ab: NONREACTIVE
SIGNAL TO CUT-OFF: 0.01 (ref <1.00)

## 2021-05-17 LAB — COMPLETE METABOLIC PANEL WITH GFR
AG Ratio: 1.6 (calc) (ref 1.0–2.5)
ALT: 7 U/L (ref 6–29)
AST: 12 U/L (ref 10–30)
Albumin: 4.2 g/dL (ref 3.6–5.1)
Alkaline phosphatase (APISO): 117 U/L (ref 31–125)
BUN: 10 mg/dL (ref 7–25)
CO2: 26 mmol/L (ref 20–32)
Calcium: 9 mg/dL (ref 8.6–10.2)
Chloride: 104 mmol/L (ref 98–110)
Creat: 0.67 mg/dL (ref 0.50–0.96)
Globulin: 2.7 g/dL (calc) (ref 1.9–3.7)
Glucose, Bld: 92 mg/dL (ref 65–99)
Potassium: 4.6 mmol/L (ref 3.5–5.3)
Sodium: 139 mmol/L (ref 135–146)
Total Bilirubin: 0.3 mg/dL (ref 0.2–1.2)
Total Protein: 6.9 g/dL (ref 6.1–8.1)
eGFR: 124 mL/min/{1.73_m2} (ref >=60)

## 2021-05-17 LAB — TSH: TSH: 1.34 mIU/L

## 2021-05-17 LAB — CBC WITH DIFFERENTIAL/PLATELET
Absolute Monocytes: 706 cells/uL (ref 200–950)
Basophils Absolute: 45 cells/uL (ref 0–200)
Basophils Relative: 0.4 %
Eosinophils Absolute: 146 cells/uL (ref 15–500)
Eosinophils Relative: 1.3 %
HCT: 38.6 % (ref 35.0–45.0)
Hemoglobin: 12.6 g/dL (ref 11.7–15.5)
Lymphs Abs: 3248 cells/uL (ref 850–3900)
MCH: 27.3 pg (ref 27.0–33.0)
MCHC: 32.6 g/dL (ref 32.0–36.0)
MCV: 83.7 fL (ref 80.0–100.0)
MPV: 11.6 fL (ref 7.5–12.5)
Monocytes Relative: 6.3 %
Neutro Abs: 7056 cells/uL (ref 1500–7800)
Neutrophils Relative %: 63 %
Platelets: 324 10*3/uL (ref 140–400)
RBC: 4.61 10*6/uL (ref 3.80–5.10)
RDW: 12.8 % (ref 11.0–15.0)
Total Lymphocyte: 29 %
WBC: 11.2 10*3/uL — ABNORMAL HIGH (ref 3.8–10.8)

## 2021-05-24 ENCOUNTER — Encounter: Payer: Self-pay | Admitting: Nurse Practitioner

## 2021-05-24 DIAGNOSIS — Z6841 Body Mass Index (BMI) 40.0 and over, adult: Secondary | ICD-10-CM | POA: Insufficient documentation

## 2021-05-24 DIAGNOSIS — R1084 Generalized abdominal pain: Secondary | ICD-10-CM | POA: Insufficient documentation

## 2021-05-24 DIAGNOSIS — K649 Unspecified hemorrhoids: Secondary | ICD-10-CM | POA: Insufficient documentation

## 2021-05-24 NOTE — Assessment & Plan Note (Signed)
Patient is interested in talking with Dietician to help with food choices.  Referral placed.  Also discussed physical activity - goal is 30 minutes 5 times weekly.

## 2021-05-24 NOTE — Assessment & Plan Note (Signed)
Chronic.  Has constipation, diarrhea, and rectal bleeding.  Unclear etiology, however differentials include IBS vs. IBD. Given rectal bleeding with hemorrhoids, will refer to GI to discuss possible further work up. Discussed elimination diet of gluten/dairy in the meantime.  Will also check CBC, TSH, electrolytes with kidney and liver function.  Follow up pending blood work.

## 2021-05-24 NOTE — Assessment & Plan Note (Signed)
Suspect related to constipation.  Start hydrocortisone suppositories.  Referral placed to GI.

## 2021-06-20 ENCOUNTER — Ambulatory Visit: Payer: Medicaid Other | Admitting: Nurse Practitioner

## 2021-07-11 ENCOUNTER — Ambulatory Visit: Payer: Medicaid Other | Admitting: Nutrition

## 2021-07-11 ENCOUNTER — Telehealth: Payer: Self-pay | Admitting: Nutrition

## 2021-07-11 NOTE — Telephone Encounter (Signed)
VM left to call and r/s missed appt. 

## 2021-11-01 ENCOUNTER — Ambulatory Visit: Payer: Self-pay | Admitting: Nurse Practitioner

## 2021-11-04 ENCOUNTER — Ambulatory Visit: Payer: Medicaid Other | Admitting: Nurse Practitioner

## 2021-11-04 ENCOUNTER — Encounter: Payer: Self-pay | Admitting: Nurse Practitioner

## 2021-11-04 VITALS — BP 122/78 | HR 97 | Temp 98.3°F | Resp 16 | Ht 62.0 in | Wt 260.3 lb

## 2021-11-04 DIAGNOSIS — M545 Low back pain, unspecified: Secondary | ICD-10-CM | POA: Diagnosis not present

## 2021-11-04 DIAGNOSIS — R197 Diarrhea, unspecified: Secondary | ICD-10-CM | POA: Diagnosis not present

## 2021-11-04 DIAGNOSIS — K5909 Other constipation: Secondary | ICD-10-CM

## 2021-11-04 DIAGNOSIS — K649 Unspecified hemorrhoids: Secondary | ICD-10-CM

## 2021-11-04 DIAGNOSIS — Z30433 Encounter for removal and reinsertion of intrauterine contraceptive device: Secondary | ICD-10-CM

## 2021-11-04 MED ORDER — NAPROXEN 500 MG PO TABS: 500.0000 mg | ORAL_TABLET | Freq: Two times a day (BID) | ORAL | 0 refills | Status: DC

## 2021-11-04 MED ORDER — METHOCARBAMOL 500 MG PO TABS: 500.0000 mg | ORAL_TABLET | Freq: Two times a day (BID) | ORAL | 0 refills | Status: DC | PRN

## 2021-11-04 NOTE — Progress Notes (Signed)
? ?BP 122/78   Pulse 97   Temp 98.3 ?F (36.8 ?C)   Resp 16   Ht 5' 2"  (1.575 m)   Wt 260 lb 4.8 oz (118.1 kg)   SpO2 99%   BMI 47.61 kg/m?   ? ?Subjective:  ? ? Patient ID: Jodi Terrell, female    DOB: 07-Jan-1996, 26 y.o.   MRN: [id number] ? ?HPI: ?Jodi Terrell is a 26 y.o. female ? ?Chief Complaint  ?Patient presents with  ? Establish Care  ? Back Pain  ?  Pulled muscle  ? ?Establish care:  last physical was years ago. She did recently have labs done which were good. She is currently in CNA school.  ? ?Constipation/ diarrhea and hemorrhoids: She has been referred to GI by previous provider. She says that she has hemorrhoids that come and go. She says that she struggles with constipation, and diarrhea.  She says that she had a lot going on when they called to set up the gi appointment. Gave her there phone number so she can set up an appointment. Discussed increasing fiber in her diet and water intake. ? 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 She has tried tylenol and motrin with no improvement.  She denies any urinary complaints, fever, no incontinence. She says the pain is across her lower back.  She denies any radiation of pain.  Movement makes the pain worse.  Will provide worksheet on back stretches, encouraged heat therapy and sent in prescription for NSAID and muscle relaxer.   ? ?IUD: she has an IUD in and is wanting to get it removed. Referral placed to GYN.  ? ?Relevant past medical, surgical, family and social history reviewed and updated as indicated. Interim medical history since our last visit reviewed. ?Allergies and medications reviewed and updated. ? ?Review of Systems ? ?Constitutional: Negative for fever or weight change.  ?Respiratory: Negative for cough and shortness of breath.   ?Cardiovascular: Negative for chest pain or palpitations.  ?Gastrointestinal: Negative for  abdominal pain, consitpation and diarrhea ?Musculoskeletal: Negative for gait problem or joint swelling. Positive for back pain ?Skin: Negative for rash.  ?Neurological: Negative for dizziness or headache.  ?No other specific complaints in a complete review of systems (except as listed in HPI above).  ? ?   ?Objective:  ?  ?BP 122/78   Pulse 97   Temp 98.3 ?F (36.8 ?C)   Resp 16   Ht 5' 2"  (1.575 m)   Wt 260 lb 4.8 oz (118.1 kg)   SpO2 99%   BMI 47.61 kg/m?   ?Wt Readings from Last 3 Encounters:  ?11/04/21 260 lb 4.8 oz (118.1 kg)  ?05/16/21 265 lb 12.8 oz (120.6 kg)  ?12/16/20 260 lb (117.9 kg)  ?  ?Physical Exam ? ?Constitutional: Patient appears well-developed and well-nourished. Obese  No distress.  ?HEENT: head atraumatic, normocephalic, pupils equal and reactive to light, neck supple ?Cardiovascular: Normal rate, regular rhythm and normal heart sounds.  No murmur heard. No BLE edema. ?Pulmonary/Chest: Effort normal and breath sounds normal. No respiratory distress. ?Abdominal: Soft.  There is no tenderness. ?MSK: low back tenderness ?Psychiatric: Patient has a normal mood and affect. behavior is normal. Judgment and thought content normal.  ? ?Results for orders placed or performed in visit on 05/16/21  ?CBC with Differential/Platelet  ?Result Value Ref Range  ?  WBC 11.2 (H) 3.8 - 10.8 Thousand/uL  ? RBC 4.61 3.80 - 5.10 Million/uL  ? Hemoglobin 12.6 11.7 - 15.5 g/dL  ? HCT 38.6 35.0 - 45.0 %  ? MCV 83.7 80.0 - 100.0 fL  ? MCH 27.3 27.0 - 33.0 pg  ? MCHC 32.6 32.0 - 36.0 g/dL  ? RDW 12.8 11.0 - 15.0 %  ? Platelets 324 140 - 400 Thousand/uL  ? MPV 11.6 7.5 - 12.5 fL  ? Neutro Abs 7,056 1,500 - 7,800 cells/uL  ? Lymphs Abs 3,248 850 - 3,900 cells/uL  ? Absolute Monocytes 706 200 - 950 cells/uL  ? Eosinophils Absolute 146 15 - 500 cells/uL  ? Basophils Absolute 45 0 - 200 cells/uL  ? Neutrophils Relative % 63 %  ? Total Lymphocyte 29.0 %  ? Monocytes Relative 6.3 %  ? Eosinophils Relative 1.3 %  ?  Basophils Relative 0.4 %  ?COMPLETE METABOLIC PANEL WITH GFR  ?Result Value Ref Range  ? Glucose, Bld 92 65 - 99 mg/dL  ? BUN 10 7 - 25 mg/dL  ? Creat 0.67 0.50 - 0.96 mg/dL  ? eGFR 124 > OR = 60 mL/min/1.72m  ? BUN/Creatinine Ratio NOT APPLICABLE 6 - 22 (calc)  ? Sodium 139 135 - 146 mmol/L  ? Potassium 4.6 3.5 - 5.3 mmol/L  ? Chloride 104 98 - 110 mmol/L  ? CO2 26 20 - 32 mmol/L  ? Calcium 9.0 8.6 - 10.2 mg/dL  ? Total Protein 6.9 6.1 - 8.1 g/dL  ? Albumin 4.2 3.6 - 5.1 g/dL  ? Globulin 2.7 1.9 - 3.7 g/dL (calc)  ? AG Ratio 1.6 1.0 - 2.5 (calc)  ? Total Bilirubin 0.3 0.2 - 1.2 mg/dL  ? Alkaline phosphatase (APISO) 117 31 - 125 U/L  ? AST 12 10 - 30 U/L  ? ALT 7 6 - 29 U/L  ?Hepatitis C antibody  ?Result Value Ref Range  ? Hepatitis C Ab NON-REACTIVE NON-REACTIVE  ? SIGNAL TO CUT-OFF 0.01 <1.00  ?TSH  ?Result Value Ref Range  ? TSH 1.34 mIU/L  ? ?   ?Assessment & Plan:  ? ?1. Hemorrhoids, unspecified hemorrhoid type ?-call gastroenterology office ?-increase fiber and water in diet ?2. Diarrhea, unspecified type ?-call gastroenterology office ?-increase fiber and water in diet ? ?3. Other constipation ?-call gastroenterology office ?-increase fiber and water in diet ? ?4. Acute bilateral low back pain without sciatica ? ?- naproxen (NAPROSYN) 500 MG tablet; Take 1 tablet (500 mg total) by mouth 2 (two) times daily with a meal.  Dispense: 30 tablet; Refill: 0 ?- methocarbamol (ROBAXIN) 500 MG tablet; Take 1 tablet (500 mg total) by mouth 2 (two) times daily as needed for muscle spasms.  Dispense: 20 tablet; Refill: 0 ? ?5. Encounter for removal and reinsertion of intrauterine contraceptive device (IUD) ? ?- Ambulatory referral to Obstetrics / Gynecology  ? ?Follow up plan: ?Return in about 6 months (around 05/07/2022). ? ? ? ? ? ?

## 2021-11-22 ENCOUNTER — Encounter: Payer: Self-pay | Admitting: Obstetrics

## 2021-12-02 ENCOUNTER — Emergency Department (HOSPITAL_COMMUNITY): Payer: Medicaid Other

## 2021-12-02 ENCOUNTER — Inpatient Hospital Stay (HOSPITAL_COMMUNITY)
Admission: EM | Admit: 2021-12-02 | Discharge: 2021-12-08 | DRG: 956 | Disposition: A | Discharge status: Home / Self Care | Payer: Medicaid Other | Attending: Orthopedic Surgery | Admitting: Orthopedic Surgery

## 2021-12-02 ENCOUNTER — Other Ambulatory Visit: Payer: Self-pay

## 2021-12-02 ENCOUNTER — Encounter (HOSPITAL_COMMUNITY): Payer: Self-pay | Admitting: Emergency Medicine

## 2021-12-02 DIAGNOSIS — Z8249 Family history of ischemic heart disease and other diseases of the circulatory system: Secondary | ICD-10-CM

## 2021-12-02 DIAGNOSIS — R7401 Elevation of levels of liver transaminase levels: Secondary | ICD-10-CM | POA: Diagnosis present

## 2021-12-02 DIAGNOSIS — S36039D Unspecified laceration of spleen, subsequent encounter: Secondary | ICD-10-CM

## 2021-12-02 DIAGNOSIS — Z6841 Body Mass Index (BMI) 40.0 and over, adult: Secondary | ICD-10-CM

## 2021-12-02 DIAGNOSIS — S72322A Displaced transverse fracture of shaft of left femur, initial encounter for closed fracture: Principal | ICD-10-CM | POA: Diagnosis present

## 2021-12-02 DIAGNOSIS — E559 Vitamin D deficiency, unspecified: Secondary | ICD-10-CM | POA: Diagnosis present

## 2021-12-02 DIAGNOSIS — S37813A Laceration of adrenal gland, initial encounter: Secondary | ICD-10-CM | POA: Diagnosis present

## 2021-12-02 DIAGNOSIS — Z20822 Contact with and (suspected) exposure to covid-19: Secondary | ICD-10-CM | POA: Diagnosis present

## 2021-12-02 DIAGNOSIS — S36031A Moderate laceration of spleen, initial encounter: Secondary | ICD-10-CM | POA: Diagnosis present

## 2021-12-02 DIAGNOSIS — Y92410 Unspecified street and highway as the place of occurrence of the external cause: Secondary | ICD-10-CM

## 2021-12-02 DIAGNOSIS — S92412A Displaced fracture of proximal phalanx of left great toe, initial encounter for closed fracture: Secondary | ICD-10-CM | POA: Diagnosis present

## 2021-12-02 DIAGNOSIS — S72402D Unspecified fracture of lower end of left femur, subsequent encounter for closed fracture with routine healing: Secondary | ICD-10-CM

## 2021-12-02 DIAGNOSIS — D62 Acute posthemorrhagic anemia: Secondary | ICD-10-CM | POA: Diagnosis present

## 2021-12-02 DIAGNOSIS — Z833 Family history of diabetes mellitus: Secondary | ICD-10-CM

## 2021-12-02 DIAGNOSIS — R319 Hematuria, unspecified: Secondary | ICD-10-CM | POA: Diagnosis present

## 2021-12-02 DIAGNOSIS — S2243XA Multiple fractures of ribs, bilateral, initial encounter for closed fracture: Secondary | ICD-10-CM | POA: Diagnosis present

## 2021-12-02 DIAGNOSIS — F17290 Nicotine dependence, other tobacco product, uncomplicated: Secondary | ICD-10-CM | POA: Diagnosis present

## 2021-12-02 DIAGNOSIS — K429 Umbilical hernia without obstruction or gangrene: Secondary | ICD-10-CM | POA: Diagnosis present

## 2021-12-02 LAB — COMPREHENSIVE METABOLIC PANEL
ALT: 76 U/L — ABNORMAL HIGH (ref 0–44)
AST: 155 U/L — ABNORMAL HIGH (ref 15–41)
Albumin: 3.8 g/dL (ref 3.5–5.0)
Alkaline Phosphatase: 108 U/L (ref 38–126)
Anion gap: 15 (ref 5–15)
BUN: 10 mg/dL (ref 6–20)
CO2: 17 mmol/L — ABNORMAL LOW (ref 22–32)
Calcium: 8.4 mg/dL — ABNORMAL LOW (ref 8.9–10.3)
Chloride: 106 mmol/L (ref 98–111)
Creatinine, Ser: 0.77 mg/dL (ref 0.44–1.00)
GFR, Estimated: >60 mL/min (ref >60)
Glucose, Bld: 159 mg/dL — ABNORMAL HIGH (ref 70–99)
Potassium: 3.4 mmol/L — ABNORMAL LOW (ref 3.5–5.1)
Sodium: 138 mmol/L (ref 135–145)
Total Bilirubin: 0.7 mg/dL (ref 0.3–1.2)
Total Protein: 6.9 g/dL (ref 6.5–8.1)

## 2021-12-02 LAB — CBC
HCT: 41.6 % (ref 36.0–46.0)
Hemoglobin: 13.5 g/dL (ref 12.0–15.0)
MCH: 28.7 pg (ref 26.0–34.0)
MCHC: 32.5 g/dL (ref 30.0–36.0)
MCV: 88.5 fL (ref 80.0–100.0)
Platelets: 331 10*3/uL (ref 150–400)
RBC: 4.7 MIL/uL (ref 3.87–5.11)
RDW: 13.3 % (ref 11.5–15.5)
WBC: 34.5 10*3/uL — ABNORMAL HIGH (ref 4.0–10.5)
nRBC: 0 % (ref 0.0–0.2)

## 2021-12-02 LAB — I-STAT BETA HCG BLOOD, ED (MC, WL, AP ONLY): I-stat hCG, quantitative: <5 m[IU]/mL (ref <5)

## 2021-12-02 LAB — LACTIC ACID, PLASMA: Lactic Acid, Venous: 2 mmol/L (ref 0.5–1.9)

## 2021-12-02 LAB — PROTIME-INR
INR: 1 (ref 0.8–1.2)
Prothrombin Time: 13.1 seconds (ref 11.4–15.2)

## 2021-12-02 LAB — I-STAT CHEM 8, ED
BUN: 9 mg/dL (ref 6–20)
Calcium, Ion: 0.98 mmol/L — ABNORMAL LOW (ref 1.15–1.40)
Chloride: 106 mmol/L (ref 98–111)
Creatinine, Ser: 0.5 mg/dL (ref 0.44–1.00)
Glucose, Bld: 162 mg/dL — ABNORMAL HIGH (ref 70–99)
HCT: 41 % (ref 36.0–46.0)
Hemoglobin: 13.9 g/dL (ref 12.0–15.0)
Potassium: 3.3 mmol/L — ABNORMAL LOW (ref 3.5–5.1)
Sodium: 138 mmol/L (ref 135–145)
TCO2: 20 mmol/L — ABNORMAL LOW (ref 22–32)

## 2021-12-02 LAB — RESP PANEL BY RT-PCR (FLU A&B, COVID) ARPGX2
Influenza A by PCR: NEGATIVE
Influenza B by PCR: NEGATIVE
SARS Coronavirus 2 by RT PCR: NEGATIVE

## 2021-12-02 LAB — SAMPLE TO BLOOD BANK

## 2021-12-02 LAB — ETHANOL: Alcohol, Ethyl (B): <10 mg/dL (ref <10)

## 2021-12-02 MED ORDER — LACTATED RINGERS IV BOLUS
1000.0000 mL | Freq: Once | INTRAVENOUS | Status: AC
  Administered 2021-12-02: 1000 mL via INTRAVENOUS

## 2021-12-02 MED ORDER — ONDANSETRON HCL 4 MG/2ML IJ SOLN
4.0000 mg | Freq: Once | INTRAMUSCULAR | Status: AC
  Administered 2021-12-02: 4 mg via INTRAVENOUS
  Filled 2021-12-02: qty 2

## 2021-12-02 MED ORDER — HYDROMORPHONE HCL 1 MG/ML IJ SOLN
1.0000 mg | Freq: Once | INTRAMUSCULAR | Status: AC
  Administered 2021-12-02: 1 mg via INTRAVENOUS
  Filled 2021-12-02: qty 1

## 2021-12-02 MED ORDER — POTASSIUM CHLORIDE 10 MEQ/100ML IV SOLN: 10.0000 meq | Freq: Once | INTRAVENOUS | Status: DC

## 2021-12-02 MED ORDER — MORPHINE SULFATE (PF) 4 MG/ML IV SOLN: 4.0000 mg | Freq: Once | INTRAVENOUS | Status: DC

## 2021-12-02 MED ORDER — IOHEXOL 300 MG/ML  SOLN
100.0000 mL | Freq: Once | INTRAMUSCULAR | Status: AC | PRN
  Administered 2021-12-02: 100 mL via INTRAVENOUS

## 2021-12-02 NOTE — ED Notes (Signed)
Pelvic binder removed.

## 2021-12-02 NOTE — Progress Notes (Signed)
Orthopedic Tech Progress Note ?Patient Details:  ?[name] ?04-15-1996 ?[id number] ? ?Ortho Devices ?Type of Ortho Device: Knee Immobilizer ?Ortho Device/Splint Location: lle ?Ortho Device/Splint Interventions: Ordered, Application, Adjustment ?  ?Post Interventions ?Patient Tolerated: Well ?Instructions Provided: Care of device, Adjustment of device ? ?Trinna Post ?12/02/2021, 9:40 PM ? ?

## 2021-12-02 NOTE — ED Notes (Signed)
[place] J placed on pt. ?
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
## 2021-12-02 NOTE — ED Notes (Signed)
Patient transported to CT with RN 
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
## 2021-12-03 ENCOUNTER — Inpatient Hospital Stay (HOSPITAL_COMMUNITY): Payer: Medicaid Other

## 2021-12-03 ENCOUNTER — Inpatient Hospital Stay (HOSPITAL_COMMUNITY): Payer: Medicaid Other | Admitting: Certified Registered"

## 2021-12-03 ENCOUNTER — Encounter (HOSPITAL_COMMUNITY): Payer: Self-pay

## 2021-12-03 ENCOUNTER — Encounter (HOSPITAL_COMMUNITY): Admission: EM | Disposition: A | Discharge status: Home / Self Care | Payer: Self-pay | Source: Home / Self Care

## 2021-12-03 ENCOUNTER — Other Ambulatory Visit: Payer: Self-pay

## 2021-12-03 DIAGNOSIS — K429 Umbilical hernia without obstruction or gangrene: Secondary | ICD-10-CM | POA: Diagnosis present

## 2021-12-03 DIAGNOSIS — E559 Vitamin D deficiency, unspecified: Secondary | ICD-10-CM | POA: Diagnosis present

## 2021-12-03 DIAGNOSIS — S72322A Displaced transverse fracture of shaft of left femur, initial encounter for closed fracture: Secondary | ICD-10-CM | POA: Diagnosis present

## 2021-12-03 DIAGNOSIS — Z20822 Contact with and (suspected) exposure to covid-19: Secondary | ICD-10-CM | POA: Diagnosis present

## 2021-12-03 DIAGNOSIS — R7401 Elevation of levels of liver transaminase levels: Secondary | ICD-10-CM | POA: Diagnosis present

## 2021-12-03 DIAGNOSIS — S36031A Moderate laceration of spleen, initial encounter: Secondary | ICD-10-CM | POA: Diagnosis present

## 2021-12-03 DIAGNOSIS — S2243XA Multiple fractures of ribs, bilateral, initial encounter for closed fracture: Secondary | ICD-10-CM | POA: Diagnosis present

## 2021-12-03 DIAGNOSIS — D62 Acute posthemorrhagic anemia: Secondary | ICD-10-CM | POA: Diagnosis present

## 2021-12-03 DIAGNOSIS — S72302A Unspecified fracture of shaft of left femur, initial encounter for closed fracture: Secondary | ICD-10-CM | POA: Diagnosis not present

## 2021-12-03 DIAGNOSIS — S37813A Laceration of adrenal gland, initial encounter: Secondary | ICD-10-CM | POA: Diagnosis present

## 2021-12-03 DIAGNOSIS — Y92410 Unspecified street and highway as the place of occurrence of the external cause: Secondary | ICD-10-CM | POA: Diagnosis not present

## 2021-12-03 DIAGNOSIS — S92412A Displaced fracture of proximal phalanx of left great toe, initial encounter for closed fracture: Secondary | ICD-10-CM | POA: Diagnosis present

## 2021-12-03 DIAGNOSIS — R319 Hematuria, unspecified: Secondary | ICD-10-CM | POA: Diagnosis present

## 2021-12-03 DIAGNOSIS — F17290 Nicotine dependence, other tobacco product, uncomplicated: Secondary | ICD-10-CM | POA: Diagnosis present

## 2021-12-03 DIAGNOSIS — Z6841 Body Mass Index (BMI) 40.0 and over, adult: Secondary | ICD-10-CM | POA: Diagnosis not present

## 2021-12-03 DIAGNOSIS — Z833 Family history of diabetes mellitus: Secondary | ICD-10-CM | POA: Diagnosis not present

## 2021-12-03 DIAGNOSIS — Z8249 Family history of ischemic heart disease and other diseases of the circulatory system: Secondary | ICD-10-CM | POA: Diagnosis not present

## 2021-12-03 HISTORY — PX: FEMUR IM NAIL: SHX1597

## 2021-12-03 LAB — CBC
HCT: 36.7 % (ref 36.0–46.0)
HCT: 34.6 % — ABNORMAL LOW (ref 36.0–46.0)
HCT: 30.8 % — ABNORMAL LOW (ref 36.0–46.0)
Hemoglobin: 12.2 g/dL (ref 12.0–15.0)
Hemoglobin: 11.9 g/dL — ABNORMAL LOW (ref 12.0–15.0)
Hemoglobin: 10 g/dL — ABNORMAL LOW (ref 12.0–15.0)
MCH: 28.4 pg (ref 26.0–34.0)
MCH: 29.1 pg (ref 26.0–34.0)
MCH: 28.3 pg (ref 26.0–34.0)
MCHC: 33.2 g/dL (ref 30.0–36.0)
MCHC: 34.4 g/dL (ref 30.0–36.0)
MCHC: 32.5 g/dL (ref 30.0–36.0)
MCV: 85.3 fL (ref 80.0–100.0)
MCV: 84.6 fL (ref 80.0–100.0)
MCV: 87.3 fL (ref 80.0–100.0)
Platelets: 234 10*3/uL (ref 150–400)
Platelets: UNDETERMINED 10*3/uL (ref 150–400)
Platelets: 165 10*3/uL (ref 150–400)
RBC: 4.3 MIL/uL (ref 3.87–5.11)
RBC: 4.09 MIL/uL (ref 3.87–5.11)
RBC: 3.53 MIL/uL — ABNORMAL LOW (ref 3.87–5.11)
RDW: 13.4 % (ref 11.5–15.5)
RDW: 13.5 % (ref 11.5–15.5)
RDW: 13.5 % (ref 11.5–15.5)
WBC: 15.7 10*3/uL — ABNORMAL HIGH (ref 4.0–10.5)
WBC: 14.2 10*3/uL — ABNORMAL HIGH (ref 4.0–10.5)
WBC: 13.7 10*3/uL — ABNORMAL HIGH (ref 4.0–10.5)
nRBC: 0 % (ref 0.0–0.2)
nRBC: 0 % (ref 0.0–0.2)
nRBC: 0 % (ref 0.0–0.2)

## 2021-12-03 LAB — COMPREHENSIVE METABOLIC PANEL
ALT: 67 U/L — ABNORMAL HIGH (ref 0–44)
AST: 110 U/L — ABNORMAL HIGH (ref 15–41)
Albumin: 3.5 g/dL (ref 3.5–5.0)
Alkaline Phosphatase: 88 U/L (ref 38–126)
Anion gap: 7 (ref 5–15)
BUN: 9 mg/dL (ref 6–20)
CO2: 23 mmol/L (ref 22–32)
Calcium: 8.5 mg/dL — ABNORMAL LOW (ref 8.9–10.3)
Chloride: 108 mmol/L (ref 98–111)
Creatinine, Ser: 0.66 mg/dL (ref 0.44–1.00)
GFR, Estimated: >60 mL/min (ref >60)
Glucose, Bld: 134 mg/dL — ABNORMAL HIGH (ref 70–99)
Potassium: 3.9 mmol/L (ref 3.5–5.1)
Sodium: 138 mmol/L (ref 135–145)
Total Bilirubin: 0.8 mg/dL (ref 0.3–1.2)
Total Protein: 6.6 g/dL (ref 6.5–8.1)

## 2021-12-03 LAB — URINALYSIS, ROUTINE W REFLEX MICROSCOPIC
Bilirubin Urine: NEGATIVE
Glucose, UA: NEGATIVE mg/dL
Ketones, ur: NEGATIVE mg/dL
Leukocytes,Ua: NEGATIVE
Nitrite: NEGATIVE
Protein, ur: 100 mg/dL — AB
RBC / HPF: >50 RBC/hpf — ABNORMAL HIGH (ref 0–5)
Specific Gravity, Urine: >1.046 — ABNORMAL HIGH (ref 1.005–1.030)
pH: 5 (ref 5.0–8.0)

## 2021-12-03 LAB — MRSA NEXT GEN BY PCR, NASAL: MRSA by PCR Next Gen: NOT DETECTED

## 2021-12-03 LAB — HIV ANTIBODY (ROUTINE TESTING W REFLEX): HIV Screen 4th Generation wRfx: NONREACTIVE

## 2021-12-03 SURGERY — INSERTION, INTRAMEDULLARY ROD, FEMUR
Anesthesia: General | Site: Leg Upper | Laterality: Left

## 2021-12-03 MED ORDER — CEFAZOLIN SODIUM-DEXTROSE 2-4 GM/100ML-% IV SOLN
2.0000 g | Freq: Three times a day (TID) | INTRAVENOUS | Status: AC
  Administered 2021-12-03 – 2021-12-04 (×3): 2 g via INTRAVENOUS
  Filled 2021-12-03 (×4): qty 100

## 2021-12-03 MED ORDER — PROPOFOL 10 MG/ML IV BOLUS
INTRAVENOUS | Status: AC
  Filled 2021-12-03: qty 20

## 2021-12-03 MED ORDER — METHOCARBAMOL 1000 MG/10ML IJ SOLN
500.0000 mg | Freq: Three times a day (TID) | INTRAVENOUS | Status: DC | PRN
  Filled 2021-12-03: qty 5

## 2021-12-03 MED ORDER — HYDROMORPHONE HCL 1 MG/ML IJ SOLN
INTRAMUSCULAR | Status: DC | PRN
  Administered 2021-12-03 (×2): .25 mg via INTRAVENOUS

## 2021-12-03 MED ORDER — HYDROMORPHONE HCL 1 MG/ML IJ SOLN
0.2500 mg | INTRAMUSCULAR | Status: DC | PRN
  Administered 2021-12-03 (×4): 0.5 mg via INTRAVENOUS

## 2021-12-03 MED ORDER — ROCURONIUM BROMIDE 10 MG/ML (PF) SYRINGE
PREFILLED_SYRINGE | INTRAVENOUS | Status: AC
  Filled 2021-12-03: qty 10

## 2021-12-03 MED ORDER — MIDAZOLAM HCL 2 MG/2ML IJ SOLN
INTRAMUSCULAR | Status: DC | PRN
  Administered 2021-12-03: 2 mg via INTRAVENOUS

## 2021-12-03 MED ORDER — FENTANYL CITRATE (PF) 250 MCG/5ML IJ SOLN
INTRAMUSCULAR | Status: DC | PRN
Start: 2021-12-03 — End: 2021-12-03
  Administered 2021-12-03: 100 ug via INTRAVENOUS
  Administered 2021-12-03 (×3): 50 ug via INTRAVENOUS

## 2021-12-03 MED ORDER — LACTATED RINGERS IV SOLN: INTRAVENOUS | Status: DC

## 2021-12-03 MED ORDER — ACETAMINOPHEN 500 MG PO TABS
1000.0000 mg | ORAL_TABLET | Freq: Four times a day (QID) | ORAL | Status: DC
  Administered 2021-12-03 – 2021-12-08 (×18): 1000 mg via ORAL
  Filled 2021-12-03 (×19): qty 2

## 2021-12-03 MED ORDER — LIDOCAINE 2% (20 MG/ML) 5 ML SYRINGE
INTRAMUSCULAR | Status: AC
  Filled 2021-12-03: qty 5

## 2021-12-03 MED ORDER — LABETALOL HCL 5 MG/ML IV SOLN
INTRAVENOUS | Status: DC | PRN
  Administered 2021-12-03 (×2): 5 mg via INTRAVENOUS

## 2021-12-03 MED ORDER — PROPOFOL 10 MG/ML IV BOLUS
INTRAVENOUS | Status: DC | PRN
  Administered 2021-12-03: 200 mg via INTRAVENOUS

## 2021-12-03 MED ORDER — CHLORHEXIDINE GLUCONATE CLOTH 2 % EX PADS
6.0000 | MEDICATED_PAD | Freq: Every day | CUTANEOUS | Status: DC
  Administered 2021-12-03 – 2021-12-08 (×6): 6 via TOPICAL

## 2021-12-03 MED ORDER — MORPHINE SULFATE (PF) 2 MG/ML IV SOLN
2.0000 mg | INTRAVENOUS | Status: DC | PRN
  Administered 2021-12-03: 2 mg via INTRAVENOUS
  Administered 2021-12-03 – 2021-12-04 (×2): 4 mg via INTRAVENOUS
  Filled 2021-12-03: qty 1
  Filled 2021-12-03 (×2): qty 2

## 2021-12-03 MED ORDER — DEXMEDETOMIDINE (PRECEDEX) IN NS 20 MCG/5ML (4 MCG/ML) IV SYRINGE
PREFILLED_SYRINGE | INTRAVENOUS | Status: DC | PRN
  Administered 2021-12-03: 8 ug via INTRAVENOUS
  Administered 2021-12-03: 12 ug via INTRAVENOUS

## 2021-12-03 MED ORDER — OXYCODONE HCL 5 MG PO TABS
10.0000 mg | ORAL_TABLET | ORAL | Status: DC | PRN
  Administered 2021-12-03 – 2021-12-07 (×16): 10 mg via ORAL
  Filled 2021-12-03 (×16): qty 2

## 2021-12-03 MED ORDER — ONDANSETRON HCL 4 MG/2ML IJ SOLN
4.0000 mg | Freq: Four times a day (QID) | INTRAMUSCULAR | Status: DC | PRN
Start: 2021-12-03 — End: 2021-12-08

## 2021-12-03 MED ORDER — DOCUSATE SODIUM 100 MG PO CAPS
100.0000 mg | ORAL_CAPSULE | Freq: Two times a day (BID) | ORAL | Status: DC
  Administered 2021-12-03 – 2021-12-08 (×9): 100 mg via ORAL
  Filled 2021-12-03 (×11): qty 1

## 2021-12-03 MED ORDER — ONDANSETRON HCL 4 MG/2ML IJ SOLN
INTRAMUSCULAR | Status: DC | PRN
  Administered 2021-12-03: 4 mg via INTRAVENOUS

## 2021-12-03 MED ORDER — FENTANYL CITRATE (PF) 250 MCG/5ML IJ SOLN
INTRAMUSCULAR | Status: AC
  Filled 2021-12-03: qty 5

## 2021-12-03 MED ORDER — MIDAZOLAM HCL 2 MG/2ML IJ SOLN
INTRAMUSCULAR | Status: AC
  Filled 2021-12-03: qty 2

## 2021-12-03 MED ORDER — HYDROMORPHONE HCL 1 MG/ML IJ SOLN
INTRAMUSCULAR | Status: AC
  Filled 2021-12-03: qty 0.5

## 2021-12-03 MED ORDER — MORPHINE SULFATE (PF) 2 MG/ML IV SOLN
1.0000 mg | INTRAVENOUS | Status: DC | PRN
  Administered 2021-12-03 (×2): 2 mg via INTRAVENOUS
  Filled 2021-12-03: qty 1

## 2021-12-03 MED ORDER — ONDANSETRON 4 MG PO TBDP
4.0000 mg | ORAL_TABLET | Freq: Four times a day (QID) | ORAL | Status: DC | PRN
  Administered 2021-12-05: 4 mg via ORAL
  Filled 2021-12-03: qty 1

## 2021-12-03 MED ORDER — CHLORHEXIDINE GLUCONATE 4 % EX LIQD: 60.0000 mL | Freq: Once | CUTANEOUS | Status: DC

## 2021-12-03 MED ORDER — ONDANSETRON HCL 4 MG/2ML IJ SOLN
INTRAMUSCULAR | Status: AC
  Filled 2021-12-03: qty 2

## 2021-12-03 MED ORDER — DEXAMETHASONE SODIUM PHOSPHATE 10 MG/ML IJ SOLN
INTRAMUSCULAR | Status: DC | PRN
  Administered 2021-12-03: 10 mg via INTRAVENOUS

## 2021-12-03 MED ORDER — METHOCARBAMOL 500 MG PO TABS
500.0000 mg | ORAL_TABLET | Freq: Three times a day (TID) | ORAL | Status: DC | PRN
  Administered 2021-12-03: 500 mg via ORAL
  Filled 2021-12-03: qty 1

## 2021-12-03 MED ORDER — CEFAZOLIN IN SODIUM CHLORIDE 3-0.9 GM/100ML-% IV SOLN
3.0000 g | INTRAVENOUS | Status: AC
  Administered 2021-12-03: 3 g via INTRAVENOUS
  Filled 2021-12-03: qty 100

## 2021-12-03 MED ORDER — MORPHINE SULFATE (PF) 2 MG/ML IV SOLN
INTRAVENOUS | Status: AC
  Filled 2021-12-03: qty 1

## 2021-12-03 MED ORDER — SUGAMMADEX SODIUM 200 MG/2ML IV SOLN
INTRAVENOUS | Status: DC | PRN
Start: 2021-12-03 — End: 2021-12-03
  Administered 2021-12-03: 250 mg via INTRAVENOUS

## 2021-12-03 MED ORDER — PANTOPRAZOLE SODIUM 40 MG PO TBEC: 40.0000 mg | DELAYED_RELEASE_TABLET | Freq: Every day | ORAL | Status: DC

## 2021-12-03 MED ORDER — LABETALOL HCL 5 MG/ML IV SOLN
INTRAVENOUS | Status: AC
  Filled 2021-12-03: qty 4

## 2021-12-03 MED ORDER — POVIDONE-IODINE 10 % EX SWAB
2.0000 "application " | Freq: Once | CUTANEOUS | Status: AC
  Administered 2021-12-03: 2 via TOPICAL

## 2021-12-03 MED ORDER — ORAL CARE MOUTH RINSE: 15.0000 mL | Freq: Once | OROMUCOSAL | Status: AC

## 2021-12-03 MED ORDER — HYDROMORPHONE HCL 1 MG/ML IJ SOLN
INTRAMUSCULAR | Status: AC
  Filled 2021-12-03: qty 1

## 2021-12-03 MED ORDER — OXYCODONE HCL 5 MG PO TABS
5.0000 mg | ORAL_TABLET | ORAL | Status: DC | PRN
  Administered 2021-12-05 – 2021-12-08 (×3): 5 mg via ORAL
  Filled 2021-12-03 (×5): qty 1

## 2021-12-03 MED ORDER — LIDOCAINE 2% (20 MG/ML) 5 ML SYRINGE
INTRAMUSCULAR | Status: DC | PRN
  Administered 2021-12-03: 60 mg via INTRAVENOUS

## 2021-12-03 MED ORDER — ACETAMINOPHEN 325 MG PO TABS
650.0000 mg | ORAL_TABLET | Freq: Four times a day (QID) | ORAL | Status: DC
  Administered 2021-12-03: 650 mg via ORAL
  Filled 2021-12-03: qty 2

## 2021-12-03 MED ORDER — ALBUMIN HUMAN 5 % IV SOLN: INTRAVENOUS | Status: DC | PRN

## 2021-12-03 MED ORDER — PANTOPRAZOLE SODIUM 40 MG IV SOLR: 40.0000 mg | Freq: Every day | INTRAVENOUS | Status: DC

## 2021-12-03 MED ORDER — ROCURONIUM BROMIDE 10 MG/ML (PF) SYRINGE
PREFILLED_SYRINGE | INTRAVENOUS | Status: DC | PRN
  Administered 2021-12-03: 80 mg via INTRAVENOUS
  Administered 2021-12-03: 20 mg via INTRAVENOUS

## 2021-12-03 MED ORDER — CHLORHEXIDINE GLUCONATE 0.12% ORAL RINSE (MEDLINE KIT)
15.0000 mL | Freq: Two times a day (BID) | OROMUCOSAL | Status: DC
  Administered 2021-12-03 – 2021-12-08 (×9): 15 mL via OROMUCOSAL

## 2021-12-03 MED ORDER — POTASSIUM CHLORIDE IN NACL 20-0.9 MEQ/L-% IV SOLN
INTRAVENOUS | Status: DC
  Filled 2021-12-03: qty 1000

## 2021-12-03 MED ORDER — 0.9 % SODIUM CHLORIDE (POUR BTL) OPTIME
TOPICAL | Status: DC | PRN
  Administered 2021-12-03: 1000 mL

## 2021-12-03 MED ORDER — METHOCARBAMOL 1000 MG/10ML IJ SOLN
1000.0000 mg | Freq: Three times a day (TID) | INTRAVENOUS | Status: DC
  Filled 2021-12-03: qty 10

## 2021-12-03 MED ORDER — CHLORHEXIDINE GLUCONATE 0.12 % MT SOLN
15.0000 mL | Freq: Once | OROMUCOSAL | Status: AC
  Administered 2021-12-03: 15 mL via OROMUCOSAL
  Filled 2021-12-03: qty 15

## 2021-12-03 MED ORDER — METHOCARBAMOL 500 MG PO TABS
1000.0000 mg | ORAL_TABLET | Freq: Three times a day (TID) | ORAL | Status: DC
  Administered 2021-12-03 – 2021-12-08 (×14): 1000 mg via ORAL
  Filled 2021-12-03 (×14): qty 2

## 2021-12-03 MED ORDER — DEXAMETHASONE SODIUM PHOSPHATE 10 MG/ML IJ SOLN
INTRAMUSCULAR | Status: AC
  Filled 2021-12-03: qty 1

## 2021-12-03 SURGICAL SUPPLY — 58 items
BAG COUNTER SPONGE SURGICOUNT (BAG) ×2 IMPLANT
BIT DRILL CALIBRATED 4.3MMX365 (DRILL) IMPLANT
BIT DRILL CROWE PNT TWST 4.5MM (DRILL) IMPLANT
BNDG COHESIVE 6X5 TAN STRL LF (GAUZE/BANDAGES/DRESSINGS) IMPLANT
BRUSH SCRUB EZ PLAIN DRY (MISCELLANEOUS) ×4 IMPLANT
COVER SURGICAL LIGHT HANDLE (MISCELLANEOUS) ×3 IMPLANT
DRAPE C-ARMOR (DRAPES) ×2 IMPLANT
DRAPE HALF SHEET 40X57 (DRAPES) ×2 IMPLANT
DRAPE INCISE IOBAN 66X45 STRL (DRAPES) ×1 IMPLANT
DRAPE ORTHO SPLIT 77X108 STRL (DRAPES) ×4
DRAPE SURG ORHT 6 SPLT 77X108 (DRAPES) ×2 IMPLANT
DRAPE U-SHAPE 47X51 STRL (DRAPES) ×2 IMPLANT
DRESSING MEPILEX FLEX 4X4 (GAUZE/BANDAGES/DRESSINGS) IMPLANT
DRILL CALIBRATED 4.3MMX365 (DRILL) ×2
DRILL CROWE POINT TWIST 4.5MM (DRILL) ×2
DRSG MEPILEX BORDER 4X4 (GAUZE/BANDAGES/DRESSINGS) ×1 IMPLANT
DRSG MEPILEX BORDER 4X8 (GAUZE/BANDAGES/DRESSINGS) ×1 IMPLANT
DRSG MEPILEX FLEX 4X4 (GAUZE/BANDAGES/DRESSINGS) ×6
ELECT REM PT RETURN 9FT ADLT (ELECTROSURGICAL) ×2
ELECTRODE REM PT RTRN 9FT ADLT (ELECTROSURGICAL) ×1 IMPLANT
GLOVE BIO SURGEON STRL SZ7.5 (GLOVE) ×2 IMPLANT
GLOVE BIO SURGEON STRL SZ8 (GLOVE) ×2 IMPLANT
GLOVE BIOGEL PI IND STRL 7.5 (GLOVE) ×1 IMPLANT
GLOVE BIOGEL PI IND STRL 8 (GLOVE) ×1 IMPLANT
GLOVE BIOGEL PI INDICATOR 7.5 (GLOVE) ×1
GLOVE BIOGEL PI INDICATOR 8 (GLOVE) ×1
GLOVE SURG ORTHO LTX SZ7.5 (GLOVE) ×4 IMPLANT
GOWN STRL REUS W/ TWL LRG LVL3 (GOWN DISPOSABLE) ×2 IMPLANT
GOWN STRL REUS W/ TWL XL LVL3 (GOWN DISPOSABLE) ×1 IMPLANT
GOWN STRL REUS W/TWL LRG LVL3 (GOWN DISPOSABLE) ×4
GOWN STRL REUS W/TWL XL LVL3 (GOWN DISPOSABLE) ×2
GUIDEPIN VERSANAIL DSP 3.2X444 (ORTHOPEDIC DISPOSABLE SUPPLIES) ×1 IMPLANT
GUIDEWIRE BEAD TIP (WIRE) ×1 IMPLANT
KIT BASIN OR (CUSTOM PROCEDURE TRAY) ×2 IMPLANT
KIT TURNOVER KIT B (KITS) ×2 IMPLANT
MANIFOLD NEPTUNE II (INSTRUMENTS) ×2 IMPLANT
NAIL FEM RETRO 10.5X360 (Nail) ×1 IMPLANT
NS IRRIG 1000ML POUR BTL (IV SOLUTION) ×2 IMPLANT
PACK GENERAL/GYN (CUSTOM PROCEDURE TRAY) ×2 IMPLANT
PAD ARMBOARD 7.5X6 YLW CONV (MISCELLANEOUS) ×4 IMPLANT
SCREW CORT TI DBL LEAD 5X30 (Screw) ×1 IMPLANT
SCREW CORT TI DBL LEAD 5X34 (Screw) ×1 IMPLANT
SCREW CORT TI DBL LEAD 5X46 (Screw) ×1 IMPLANT
SCREW CORT TI DBL LEAD 5X58 (Screw) ×1 IMPLANT
SCREW CORT TI DBL LEAD 5X70 (Screw) ×1 IMPLANT
STAPLER VISISTAT 35W (STAPLE) ×2 IMPLANT
STOCKINETTE IMPERVIOUS LG (DRAPES) ×1 IMPLANT
SUT ETHILON 2 0 FS 18 (SUTURE) ×3 IMPLANT
SUT VIC AB 0 CT1 27 (SUTURE) ×2
SUT VIC AB 0 CT1 27XBRD ANBCTR (SUTURE) ×1 IMPLANT
SUT VIC AB 1 CT1 27 (SUTURE) ×2
SUT VIC AB 1 CT1 27XBRD ANBCTR (SUTURE) ×1 IMPLANT
SUT VIC AB 2-0 CT1 27 (SUTURE) ×2
SUT VIC AB 2-0 CT1 TAPERPNT 27 (SUTURE) ×1 IMPLANT
TAPE CLOTH 4X10 WHT NS (GAUZE/BANDAGES/DRESSINGS) ×1 IMPLANT
TOWEL GREEN STERILE (TOWEL DISPOSABLE) ×4 IMPLANT
TOWEL GREEN STERILE FF (TOWEL DISPOSABLE) ×2 IMPLANT
WATER STERILE IRR 1000ML POUR (IV SOLUTION) ×2 IMPLANT
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
## 2021-12-03 NOTE — Progress Notes (Signed)
?  Transition of Care (TOC) Screening Note ? ? ?Patient Details  ?Name: Jodi Terrell ?Date of Birth: 09-15-1995 ? ? ?Transition of Care [facility]) CM/SW Contact:    ?Glennon Mac, RN ?Phone Number: ?12/03/2021, 4:54 PM ? ? ? ?Transition of Care Department [facility]) has reviewed patient and no TOC needs have been identified at this time. We will continue to monitor patient advancement through interdisciplinary progression rounds. If new patient transition needs arise, please place a TOC consult. ? ?Quintella Baton, RN, BSN  ?Trauma/Neuro ICU Case Manager ?[phone] ? ?

## 2021-12-03 NOTE — TOC CAGE-AID Note (Signed)
Transition of Care (TOC) - CAGE-AID Screening ? ? ?Patient Details  ?Name: Jodi Terrell ?MRN: [id number] ?Date of Birth: 04-29-1996 ? ?Transition of Care (TOC) CM/SW Contact:    ?Keturah Yerby C Tarpley-Carter, LCSWA ?Phone Number: ?12/03/2021, 1:48 PM ? ? ?Clinical Narrative: ?Pt participated in Cage-Aid.  Pt stated she does not use substance or ETOH.  Pt was not offered resources, due to no usage of substance or ETOH.    ? ?Insurance underwriter, MSW, LCSW-A ?Pronouns:  She/Her/Hers ?[facility] ?Clinical Social Worker ?Direct Number:  [phone] ?[email or web address] ? ?CAGE-AID Screening: ?  ? ?Have You Ever Felt You Ought to Cut Down on Your Drinking or Drug Use?: No ?Have People Annoyed You By Critizing Your Drinking Or Drug Use?: No ?Have You Felt Bad Or Guilty About Your Drinking Or Drug Use?: No ?Have You Ever Had a Drink or Used Drugs First Thing In The Morning to Steady Your Nerves or to Get Rid of a Hangover?: No ?CAGE-AID Score: 0 ? ?Substance Abuse Education Offered: No ? ?  ? ? ? ? ? ? ?

## 2021-12-03 NOTE — Progress Notes (Signed)
Report called to [facility] in pre-op. Pt has been npo. Foley emptied. Consent in chart. MRSA neg, covid neg. Pt alert, oriented. VSS. ?

## 2021-12-03 NOTE — Anesthesia Procedure Notes (Signed)
Procedure Name: Intubation ?Date/Time: 12/03/2021 3:50 PM ?Performed by: Erick Colace, CRNA ?Pre-anesthesia Checklist: Patient identified, Emergency Drugs available, Suction available and Patient being monitored ?Patient Re-evaluated:Patient Re-evaluated prior to induction ?Oxygen Delivery Method: Circle system utilized ?Preoxygenation: Pre-oxygenation with 100% oxygen ?Induction Type: IV induction ?Ventilation: Mask ventilation without difficulty and Oral airway inserted - appropriate to patient size ?Laryngoscope Size: Mac and 4 ?Grade View: Grade I ?Tube type: Oral ?Tube size: 7.0 mm ?Number of attempts: 1 ?Airway Equipment and Method: Stylet and Oral airway ?Placement Confirmation: ETT inserted through vocal cords under direct vision, positive ETCO2 and breath sounds checked- equal and bilateral ?Secured at: 23 cm ?Tube secured with: Tape ?Dental Injury: Teeth and Oropharynx as per pre-operative assessment  ? ? ? ? ?
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
## 2021-12-03 NOTE — Transfer of Care (Addendum)
Immediate Anesthesia Transfer of Care Note ? ?Patient: Jodi Terrell ? ?Procedure(s) Performed: INTRAMEDULLARY (IM) RETRONAIL FEMORAL (Left: Leg Upper) ? ?Patient Location: PACU ? ?Anesthesia Type:General ? ?Level of Consciousness: drowsy and patient cooperative ? ?Airway & Oxygen Therapy: Patient Spontanous Breathing and Patient connected to face mask oxygen ? ?Post-op Assessment: Report given to RN and Post -op Vital signs reviewed and stable ? ?Post vital signs: Reviewed and stable ? ?Last Vitals:  ?Vitals Value Taken Time  ?BP 171/126 12/03/21 1810  ?Temp    ?Pulse 100 12/03/21 1812  ?Resp 17 12/03/21 1812  ?SpO2 93 % 12/03/21 1812  ?Vitals shown include unvalidated device data. ? ?Last Pain:  ?Vitals:  ? 12/03/21 1423  ?TempSrc:   ?PainSc: 3   ?   ? ? followup pressure 147/92, pt arm bent with previous reading ? ?Complications: No notable events documented. ?
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
## 2021-12-03 NOTE — Anesthesia Preprocedure Evaluation (Addendum)
Anesthesia Evaluation  ?Patient identified by MRN, date of birth, ID band ?Patient awake ? ? ? ?Reviewed: ?Allergy & Precautions, H&P , NPO status , Patient's Chart, lab work & pertinent test results ? ?Airway ?Mallampati: II ? ?TM Distance: >3 FB ?Neck ROM: Full ? ? ? Dental ?no notable dental hx. ?(+) Teeth Intact, Dental Advisory Given ?  ?Pulmonary ?neg pulmonary ROS, former smoker,  ?  ?Pulmonary exam normal ?breath sounds clear to auscultation ? ? ? ? ? ? Cardiovascular ?negative cardio ROS ? ? ?Rhythm:Regular Rate:Normal ? ? ?  ?Neuro/Psych ?negative neurological ROS ? negative psych ROS  ? GI/Hepatic ?negative GI ROS, Neg liver ROS,   ?Endo/Other  ?Morbid obesity ? Renal/GU ?negative Renal ROS  ?negative genitourinary ?  ?Musculoskeletal ? ? Abdominal ?  ?Peds ? Hematology ?negative hematology ROS ?(+)   ?Anesthesia Other Findings ? ? Reproductive/Obstetrics ?negative OB ROS ? ?  ? ? ? ? ? ? ? ? ? ? ? ? ? ?  ?  ? ? ? ? ? ? ? ?Anesthesia Physical ?Anesthesia Plan ? ?ASA: 2 ? ?Anesthesia Plan: General  ? ?Post-op Pain Management:   ? ?Induction: Intravenous ? ?PONV Risk Score and Plan: 4 or greater and Ondansetron, Dexamethasone and Midazolam ? ?Airway Management Planned: Oral ETT ? ?Additional Equipment:  ? ?Intra-op Plan:  ? ?Post-operative Plan: Extubation in OR ? ?Informed Consent: I have reviewed the patients History and Physical, chart, labs and discussed the procedure including the risks, benefits and alternatives for the proposed anesthesia with the patient or authorized representative who has indicated his/her understanding and acceptance.  ? ? ? ?Dental advisory given ? ?Plan Discussed with: CRNA ? ?Anesthesia Plan Comments:   ? ? ? ? ? ?Anesthesia Quick Evaluation ? ?

## 2021-12-04 ENCOUNTER — Encounter (HOSPITAL_COMMUNITY): Payer: Self-pay | Admitting: Orthopedic Surgery

## 2021-12-04 LAB — CBC
HCT: 27.8 % — ABNORMAL LOW (ref 36.0–46.0)
HCT: 29.3 % — ABNORMAL LOW (ref 36.0–46.0)
HCT: 28 % — ABNORMAL LOW (ref 36.0–46.0)
Hemoglobin: 9.2 g/dL — ABNORMAL LOW (ref 12.0–15.0)
Hemoglobin: 9.6 g/dL — ABNORMAL LOW (ref 12.0–15.0)
Hemoglobin: 9.2 g/dL — ABNORMAL LOW (ref 12.0–15.0)
MCH: 28.4 pg (ref 26.0–34.0)
MCH: 28.5 pg (ref 26.0–34.0)
MCH: 28.8 pg (ref 26.0–34.0)
MCHC: 33.1 g/dL (ref 30.0–36.0)
MCHC: 32.8 g/dL (ref 30.0–36.0)
MCHC: 32.9 g/dL (ref 30.0–36.0)
MCV: 85.8 fL (ref 80.0–100.0)
MCV: 86.9 fL (ref 80.0–100.0)
MCV: 87.5 fL (ref 80.0–100.0)
Platelets: 173 10*3/uL (ref 150–400)
Platelets: 201 10*3/uL (ref 150–400)
Platelets: 207 10*3/uL (ref 150–400)
RBC: 3.24 MIL/uL — ABNORMAL LOW (ref 3.87–5.11)
RBC: 3.37 MIL/uL — ABNORMAL LOW (ref 3.87–5.11)
RBC: 3.2 MIL/uL — ABNORMAL LOW (ref 3.87–5.11)
RDW: 13.3 % (ref 11.5–15.5)
RDW: 13.3 % (ref 11.5–15.5)
RDW: 13.3 % (ref 11.5–15.5)
WBC: 13.2 10*3/uL — ABNORMAL HIGH (ref 4.0–10.5)
WBC: 15.7 10*3/uL — ABNORMAL HIGH (ref 4.0–10.5)
WBC: 17.6 10*3/uL — ABNORMAL HIGH (ref 4.0–10.5)
nRBC: 0 % (ref 0.0–0.2)
nRBC: 0 % (ref 0.0–0.2)
nRBC: 0 % (ref 0.0–0.2)

## 2021-12-04 LAB — BASIC METABOLIC PANEL
Anion gap: 5 (ref 5–15)
BUN: 5 mg/dL — ABNORMAL LOW (ref 6–20)
CO2: 25 mmol/L (ref 22–32)
Calcium: 8.2 mg/dL — ABNORMAL LOW (ref 8.9–10.3)
Chloride: 105 mmol/L (ref 98–111)
Creatinine, Ser: 0.66 mg/dL (ref 0.44–1.00)
GFR, Estimated: >60 mL/min (ref >60)
Glucose, Bld: 132 mg/dL — ABNORMAL HIGH (ref 70–99)
Potassium: 3.9 mmol/L (ref 3.5–5.1)
Sodium: 135 mmol/L (ref 135–145)

## 2021-12-04 LAB — URINE CULTURE: Culture: NO GROWTH

## 2021-12-04 LAB — VITAMIN D 25 HYDROXY (VIT D DEFICIENCY, FRACTURES): Vit D, 25-Hydroxy: 13.83 ng/mL — ABNORMAL LOW (ref 30–100)

## 2021-12-04 MED ORDER — POLYETHYLENE GLYCOL 3350 17 G PO PACK
17.0000 g | PACK | Freq: Two times a day (BID) | ORAL | Status: DC
  Administered 2021-12-04 – 2021-12-07 (×5): 17 g via ORAL
  Filled 2021-12-04 (×7): qty 1

## 2021-12-04 MED ORDER — MORPHINE SULFATE (PF) 2 MG/ML IV SOLN
2.0000 mg | Freq: Four times a day (QID) | INTRAVENOUS | Status: DC | PRN
  Administered 2021-12-04: 2 mg via INTRAVENOUS
  Administered 2021-12-08: 4 mg via INTRAVENOUS
  Filled 2021-12-04 (×2): qty 2

## 2021-12-04 MED ORDER — ENOXAPARIN SODIUM 30 MG/0.3ML IJ SOSY
30.0000 mg | PREFILLED_SYRINGE | Freq: Two times a day (BID) | INTRAMUSCULAR | Status: DC
  Administered 2021-12-05 – 2021-12-07 (×6): 30 mg via SUBCUTANEOUS
  Filled 2021-12-04 (×8): qty 0.3

## 2021-12-04 MED ORDER — BISACODYL 10 MG RE SUPP
10.0000 mg | Freq: Once | RECTAL | Status: AC
  Administered 2021-12-04: 10 mg via RECTAL
  Filled 2021-12-04: qty 1

## 2021-12-04 NOTE — Progress Notes (Signed)
Trauma Event Note ? ? ? ?TRN at bedside to round. Patient reports sensation of 'something in my eye' since returning from OR today, states its irritating and she has blurred vision in right eye. Wears glasses. Badi primary RN reports flushing with saline without improvement. Concern for corneal abrasion. ? ?Last imported Vital Signs ?BP 123/79   Pulse 84   Temp 97.8 ?F (36.6 ?C) (Oral)   Resp 14   Ht 5\' 2"  (1.575 m)   Wt 258 lb (117 kg)   SpO2 98%   BMI 47.19 kg/m?  ? ?Trending CBC ?Recent Labs  ?  12/03/21 ?0357 12/03/21 ?1011 12/03/21 ?2025  ?WBC 15.7* 14.2* 13.7*  ?HGB 12.2 11.9* 10.0*  ?HCT 36.7 34.6* 30.8*  ?PLT 234 PLATELET CLUMPS NOTED ON SMEAR, UNABLE TO ESTIMATE 165  ? ? ?Trending Coag's ?Recent Labs  ?  12/02/21 ?1812  ?INR 1.0  ? ? ?Trending BMET ?Recent Labs  ?  12/02/21 ?1812 12/02/21 ?1828 12/03/21 ?0357  ?NA 138 138 138  ?K 3.4* 3.3* 3.9  ?CL 106 106 108  ?CO2 17*  --  23  ?BUN 10 9 9   ?CREATININE 0.77 0.50 0.66  ?GLUCOSE 159* 162* 134*  ? ? ? ? ?Jodi Terrell O Jodi Terrell  ?Trauma Response RN ? ?Please call TRN at [phone] for further assistance. ? ? ?  ?
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
## 2021-12-04 NOTE — Anesthesia Postprocedure Evaluation (Signed)
Anesthesia Post Note ? ?Patient: Jodi Terrell ? ?Procedure(s) Performed: INTRAMEDULLARY (IM) RETRONAIL FEMORAL (Left: Leg Upper) ? ?  ? ?Patient location during evaluation: Other ?Anesthesia Type: General ?Level of consciousness: awake and alert ?Pain management: pain level controlled ?Vital Signs Assessment: post-procedure vital signs reviewed and stable ?Respiratory status: spontaneous breathing, nonlabored ventilation and respiratory function stable ?Cardiovascular status: blood pressure returned to baseline and stable ?Postop Assessment: no apparent nausea or vomiting ?Anesthetic complications: no ? ? ?No notable events documented. ? ?Last Vitals:  ?Vitals:  ? 12/04/21 1200 12/04/21 1225  ?BP:  125/74  ?Pulse:    ?Resp:  15  ?Temp: 36.8 ?C   ?SpO2:    ?  ?Last Pain:  ?Vitals:  ? 12/04/21 1200  ?TempSrc: Oral  ?PainSc:   ? ? ?  ?  ?  ?  ?  ?  ? ?Anatasia Tino,W. EDMOND ? ? ? ? ?
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
## 2021-12-04 NOTE — Progress Notes (Signed)
Orthopedic Tech Progress Note ?Patient Details:  ?Jodi Terrell ?27-Mar-1996 ?[id number] ? ?Ortho Devices ?Type of Ortho Device: Postop shoe/boot ?Ortho Device/Splint Location: LLE ?Ortho Device/Splint Interventions: Ordered ?  ?Post Interventions ?Patient Tolerated: Well ?Instructions Provided: Care of device ? ?Donald Pore ?12/04/2021, 11:04 AM ? ?

## 2021-12-05 LAB — CBC
HCT: 26.9 % — ABNORMAL LOW (ref 36.0–46.0)
Hemoglobin: 9.1 g/dL — ABNORMAL LOW (ref 12.0–15.0)
MCH: 29.4 pg (ref 26.0–34.0)
MCHC: 33.8 g/dL (ref 30.0–36.0)
MCV: 87.1 fL (ref 80.0–100.0)
Platelets: 202 10*3/uL (ref 150–400)
RBC: 3.09 MIL/uL — ABNORMAL LOW (ref 3.87–5.11)
RDW: 13.6 % (ref 11.5–15.5)
WBC: 11.9 10*3/uL — ABNORMAL HIGH (ref 4.0–10.5)
nRBC: 0 % (ref 0.0–0.2)

## 2021-12-05 MED ORDER — SIMETHICONE 80 MG PO CHEW: 80.0000 mg | CHEWABLE_TABLET | Freq: Four times a day (QID) | ORAL | Status: DC | PRN
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
## 2021-12-05 NOTE — Progress Notes (Signed)
Mobility Specialist Progress Note  ? ? 12/05/21 1517  ?Mobility  ?Activity Ambulated with assistance to bathroom  ?Level of Assistance Minimal assist, patient does 75% or more  ?Assistive Device Front wheel walker  ?LLE Weight Bearing WBAT  ?Distance Ambulated (ft) 24 ft ?(12+12)  ?Activity Response Tolerated well  ?$Mobility charge 1 Mobility  ? ?Pt received in bed and agreeable. No complaints Had BM. Returned to bed with call bell in reach.   ? ?Elgin Nation ?Mobility Specialist  ?Primary: 5N M.S. Phone: [phone] ?Secondary: 6N M.S. Phone: [phone] ?  ?
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
## 2021-12-06 ENCOUNTER — Other Ambulatory Visit (HOSPITAL_COMMUNITY): Payer: Self-pay

## 2021-12-06 LAB — CBC
HCT: 27.9 % — ABNORMAL LOW (ref 36.0–46.0)
Hemoglobin: 8.9 g/dL — ABNORMAL LOW (ref 12.0–15.0)
MCH: 28 pg (ref 26.0–34.0)
MCHC: 31.9 g/dL (ref 30.0–36.0)
MCV: 87.7 fL (ref 80.0–100.0)
Platelets: 207 10*3/uL (ref 150–400)
RBC: 3.18 MIL/uL — ABNORMAL LOW (ref 3.87–5.11)
RDW: 13.4 % (ref 11.5–15.5)
WBC: 8.4 10*3/uL (ref 4.0–10.5)
nRBC: 0 % (ref 0.0–0.2)

## 2021-12-06 MED ORDER — ACETAMINOPHEN 500 MG PO TABS: 1000.0000 mg | ORAL_TABLET | Freq: Four times a day (QID) | ORAL | Status: AC | PRN

## 2021-12-06 MED ORDER — ASCORBIC ACID 500 MG PO TABS
500.0000 mg | ORAL_TABLET | Freq: Every day | ORAL | Status: DC
  Administered 2021-12-06 – 2021-12-08 (×3): 500 mg via ORAL
  Filled 2021-12-06 (×3): qty 1

## 2021-12-06 MED ORDER — VITAMIN D 25 MCG (1000 UNIT) PO TABS
2000.0000 [IU] | ORAL_TABLET | Freq: Two times a day (BID) | ORAL | Status: DC
  Administered 2021-12-06 – 2021-12-08 (×5): 2000 [IU] via ORAL
  Filled 2021-12-06 (×5): qty 2

## 2021-12-06 MED ORDER — OXYCODONE HCL 5 MG PO TABS
5.0000 mg | ORAL_TABLET | ORAL | 0 refills | Status: AC | PRN
  Filled 2021-12-06: qty 30, 5d supply, fill #0

## 2021-12-06 MED ORDER — VITAMIN D3 25 MCG PO TABS
2000.0000 [IU] | ORAL_TABLET | Freq: Two times a day (BID) | ORAL | 0 refills | Status: AC
  Filled 2021-12-06: qty 120, 30d supply, fill #0

## 2021-12-06 MED ORDER — ASCORBIC ACID 500 MG PO TABS
500.0000 mg | ORAL_TABLET | Freq: Every day | ORAL | 0 refills | Status: AC
  Filled 2021-12-06: qty 30, 30d supply, fill #0

## 2021-12-06 MED ORDER — CALCIUM CITRATE 950 (200 CA) MG PO TABS
200.0000 mg | ORAL_TABLET | Freq: Two times a day (BID) | ORAL | 0 refills | Status: DC
Start: 2021-12-06 — End: 2022-06-18
  Filled 2021-12-06: qty 60, 30d supply, fill #0

## 2021-12-06 MED ORDER — METHOCARBAMOL 500 MG PO TABS
1000.0000 mg | ORAL_TABLET | Freq: Three times a day (TID) | ORAL | 0 refills | Status: AC | PRN
  Filled 2021-12-06: qty 30, 5d supply, fill #0

## 2021-12-06 MED ORDER — POLYETHYLENE GLYCOL 3350 17 G PO PACK: 17.0000 g | PACK | Freq: Every day | ORAL | Status: DC | PRN

## 2021-12-06 MED ORDER — CALCIUM CITRATE 950 (200 CA) MG PO TABS
200.0000 mg | ORAL_TABLET | Freq: Two times a day (BID) | ORAL | Status: DC
  Administered 2021-12-06 – 2021-12-08 (×5): 200 mg via ORAL
  Filled 2021-12-06 (×5): qty 1

## 2021-12-06 MED ORDER — DOCUSATE SODIUM 100 MG PO CAPS: 100.0000 mg | ORAL_CAPSULE | Freq: Two times a day (BID) | ORAL | Status: DC | PRN

## 2021-12-06 MED ORDER — ZINC SULFATE 220 (50 ZN) MG PO CAPS
220.0000 mg | ORAL_CAPSULE | Freq: Every day | ORAL | Status: DC
  Administered 2021-12-06 – 2021-12-08 (×3): 220 mg via ORAL
  Filled 2021-12-06 (×3): qty 1
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
## 2021-12-06 NOTE — Progress Notes (Signed)
Mobility Specialist Progress Note  ? ? 12/06/21 1630  ?Mobility  ?Activity Refused mobility  ? ?Pt wanting to wait for the rest of her lunch. Will f/u as schedule permits.  ? ?Jodi Terrell ?Mobility Specialist  ?Primary: 5N M.S. Phone: [phone] ?Secondary: 6N M.S. Phone: [phone] ?  ?
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
## 2021-12-06 NOTE — Progress Notes (Signed)
Orthopedic Tech Progress Note ?Patient Details:  ?[name] ?Jan 06, 1996 ?[id number] ?Called [employer] for Vive compression socks ?Patient ID: SHARNICE BOSLER, female   DOB: Dec 23, 1995, 26 y.o.   MRN: [id number] ? ?Harvest Stanco A Malayzia Laforte ?12/06/2021, 11:11 AM ? ?

## 2021-12-07 NOTE — Progress Notes (Signed)
Mobility Specialist Progress Note  ? ? 12/07/21 1634  ?Mobility  ?Activity Refused mobility  ? ?Pt stated she is not feeling well and still feeling nauseous. Will f/u as schedule permits.  ? ?Shawano Nation ?Mobility Specialist  ?Primary: 5N M.S. Phone: [phone] ?Secondary: 6N M.S. Phone: [phone] ?  ?
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
## 2021-12-07 NOTE — Progress Notes (Signed)
PT Cancellation Note ? ?Patient Details ?Name: Jodi Terrell ?MRN: [id number] ?DOB: 1996/05/01 ? ? ?Cancelled Treatment:    Reason Eval/Treat Not Completed: Other (comment). Pt with nausea and vomiting this AM. Feeling too nauseous to participate in therapy. Will check back as time allows.  ? ? ?Sheral Apley ?12/07/2021, 1:20 PM ?
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
## 2021-12-08 NOTE — TOC Transition Note (Signed)
Transition of Care (TOC) - CM/SW Discharge Note ? ? ?Patient Details  ?Name: Jodi Terrell ?MRN: [id number] ?Date of Birth: 05-13-1996 ? ?Transition of Care (TOC) CM/SW Contact:  ?Bess Kinds, RN ?Phone Number: [phone] ?12/08/2021, 11:31 AM ? ? ?Clinical Narrative:    ? ?Patient to transition home today. Previous TOC RNCM had arranged for outpatient PT and DME referral. Contacted [employer] to advise of DC today. DME to be delivered to bedside. No further TOC needs identified at this time.  ? ?Final next level of care: Home/Self Care ?Barriers to Discharge: No Barriers Identified ? ? ?Patient Goals and CMS Choice ?Patient states their goals for this hospitalization and ongoing recovery are:: to go home ?  ?  ? ?Discharge Placement ?  ?           ?  ?  ?  ?  ? ?Discharge Plan and Services ?  ?Discharge Planning Services: CM Consult ?           ?DME Arranged: 3-N-1, Walker rolling ?DME Agency: [employer] ?Date DME Agency Contacted: 12/08/21 ?Time DME Agency Contacted: 1131 ?Representative spoke with at DME Agency: Leavy Cella ?  ?  ?  ?  ?  ? ?Social Determinants of Health (SDOH) Interventions ?  ? ? ?Readmission Risk Interventions ?   ? View : No data to display.  ?  ?  ?  ? ? ? ? ? ?

## 2021-12-09 ENCOUNTER — Other Ambulatory Visit (HOSPITAL_COMMUNITY): Payer: Self-pay

## 2021-12-16 ENCOUNTER — Telehealth: Payer: Self-pay | Admitting: Nurse Practitioner

## 2021-12-16 ENCOUNTER — Other Ambulatory Visit (HOSPITAL_COMMUNITY): Payer: Self-pay

## 2021-12-16 NOTE — Telephone Encounter (Signed)
Medication Refill - Medication: oxyCODONE (OXY IR/ROXICODONE) 5 MG immediate release tablet ? ?Pt stated she received medication while at ED however, she has one tablet left and she needs a refill.  ?Pt has an upcoming appointment on 05/10.  ? ?Has the patient contacted their pharmacy? Yes.   ? ?(Agent: If yes, when and what did the pharmacy advise?) ? ?Preferred Pharmacy (with phone number or street name):  ?[id number] [place], [place] - [address]  ?[address] [place] [address]  ?Phone: [phone] Fax: [phone]  ?Hours: Not open 24 hours  ? ?Has the patient been seen for an appointment in the last year OR does the patient have an upcoming appointment? Yes.   ? ?Agent: Please be advised that RX refills may take up to 3 business days. We ask that you follow-up with your pharmacy.  ?

## 2021-12-16 NOTE — Telephone Encounter (Signed)
I called patient and informed her the office do not prescribe pain medication  and when she come into office to be seen on 5/10 she is more than welcome to discuss with her provider in detail her pain at that time. She stated she called ER and was informed to call here. I told her at first visit we went over pain medication policy and we do bot prescribe oxycodone. Patient stated she will go back to ER ?

## 2021-12-17 NOTE — Telephone Encounter (Signed)
Patient notified
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
## 2021-12-18 ENCOUNTER — Inpatient Hospital Stay: Payer: Medicaid Other | Admitting: Nurse Practitioner

## 2021-12-19 ENCOUNTER — Inpatient Hospital Stay: Payer: Medicaid Other | Admitting: Nurse Practitioner

## 2021-12-24 ENCOUNTER — Ambulatory Visit: Payer: Medicaid Other | Attending: General Surgery

## 2021-12-24 DIAGNOSIS — M6281 Muscle weakness (generalized): Secondary | ICD-10-CM | POA: Diagnosis present

## 2021-12-24 DIAGNOSIS — R262 Difficulty in walking, not elsewhere classified: Secondary | ICD-10-CM | POA: Diagnosis present

## 2021-12-24 DIAGNOSIS — R2681 Unsteadiness on feet: Secondary | ICD-10-CM

## 2021-12-24 DIAGNOSIS — M79605 Pain in left leg: Secondary | ICD-10-CM | POA: Diagnosis not present

## 2021-12-24 DIAGNOSIS — R2689 Other abnormalities of gait and mobility: Secondary | ICD-10-CM | POA: Diagnosis present
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
## 2021-12-27 ENCOUNTER — Encounter: Payer: Medicaid Other | Admitting: Obstetrics

## 2021-12-31 ENCOUNTER — Ambulatory Visit: Payer: Medicaid Other

## 2021-12-31 DIAGNOSIS — M79605 Pain in left leg: Secondary | ICD-10-CM | POA: Diagnosis not present

## 2021-12-31 DIAGNOSIS — M6281 Muscle weakness (generalized): Secondary | ICD-10-CM

## 2021-12-31 DIAGNOSIS — R262 Difficulty in walking, not elsewhere classified: Secondary | ICD-10-CM

## 2021-12-31 DIAGNOSIS — R2689 Other abnormalities of gait and mobility: Secondary | ICD-10-CM

## 2021-12-31 DIAGNOSIS — R2681 Unsteadiness on feet: Secondary | ICD-10-CM
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
## 2022-01-02 ENCOUNTER — Ambulatory Visit: Payer: Medicaid Other

## 2022-01-02 DIAGNOSIS — M79605 Pain in left leg: Secondary | ICD-10-CM

## 2022-01-02 DIAGNOSIS — R2689 Other abnormalities of gait and mobility: Secondary | ICD-10-CM

## 2022-01-02 DIAGNOSIS — M6281 Muscle weakness (generalized): Secondary | ICD-10-CM
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
## 2022-01-07 ENCOUNTER — Ambulatory Visit: Payer: Medicaid Other

## 2022-01-07 DIAGNOSIS — M79605 Pain in left leg: Secondary | ICD-10-CM | POA: Diagnosis not present

## 2022-01-07 DIAGNOSIS — M6281 Muscle weakness (generalized): Secondary | ICD-10-CM

## 2022-01-07 DIAGNOSIS — R2689 Other abnormalities of gait and mobility: Secondary | ICD-10-CM
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
## 2022-01-09 ENCOUNTER — Ambulatory Visit: Payer: Medicaid Other | Attending: General Surgery

## 2022-01-09 ENCOUNTER — Telehealth: Payer: Self-pay

## 2022-01-09 DIAGNOSIS — R2681 Unsteadiness on feet: Secondary | ICD-10-CM | POA: Insufficient documentation

## 2022-01-09 DIAGNOSIS — R262 Difficulty in walking, not elsewhere classified: Secondary | ICD-10-CM | POA: Insufficient documentation

## 2022-01-09 DIAGNOSIS — M79605 Pain in left leg: Secondary | ICD-10-CM | POA: Insufficient documentation

## 2022-01-09 DIAGNOSIS — R2689 Other abnormalities of gait and mobility: Secondary | ICD-10-CM | POA: Insufficient documentation

## 2022-01-09 DIAGNOSIS — M6281 Muscle weakness (generalized): Secondary | ICD-10-CM | POA: Insufficient documentation
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
## 2022-01-09 NOTE — Telephone Encounter (Signed)
Spoke with patient about missed appt this morning. She stated she has a very bad cold right now. Confirmed next appt. 1st no-show  Harland German, [place] 01/09/22 6:03 PM

## 2022-01-14 ENCOUNTER — Ambulatory Visit: Payer: Medicaid Other

## 2022-01-14 DIAGNOSIS — R2681 Unsteadiness on feet: Secondary | ICD-10-CM | POA: Diagnosis present

## 2022-01-14 DIAGNOSIS — M79605 Pain in left leg: Secondary | ICD-10-CM | POA: Diagnosis present

## 2022-01-14 DIAGNOSIS — M6281 Muscle weakness (generalized): Secondary | ICD-10-CM

## 2022-01-14 DIAGNOSIS — R2689 Other abnormalities of gait and mobility: Secondary | ICD-10-CM | POA: Diagnosis present

## 2022-01-14 DIAGNOSIS — R262 Difficulty in walking, not elsewhere classified: Secondary | ICD-10-CM | POA: Diagnosis present
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
## 2022-01-15 ENCOUNTER — Encounter: Payer: Self-pay | Admitting: Obstetrics

## 2022-01-15 ENCOUNTER — Ambulatory Visit: Payer: Medicaid Other | Admitting: Obstetrics

## 2022-01-15 VITALS — BP 135/76 | HR 89 | Ht 62.0 in | Wt 245.9 lb

## 2022-01-15 DIAGNOSIS — Z3046 Encounter for surveillance of implantable subdermal contraceptive: Secondary | ICD-10-CM | POA: Diagnosis not present

## 2022-01-15 NOTE — Progress Notes (Signed)
NEXPLANON REMOVAL AND INSERTION  SUBJECTIVE Jodi Terrell is a 26 y.o. [phone] who presents today for removal of her Nexplanon and insertion of a new Nexplanon. Her Nexplanon was placed in 2020. She reports that this contraceptive method is working well for her. She has no contraindications to continued use and denies any adverse effects.  OBJECTIVE Vitals:   01/15/22 0911  BP: 135/76  Pulse: 89    LMP 12/23/21. She has not had intercourse since then.  Procedure Note Consent was obtained before beginning this procedure. The Nexplanon was palpated and the surrounding skin was prepped with iodine in sterile fashion. Adequate anesthesia was achieved with subdermal injection of 1% lidocaine. A skin incision was made over the distal aspect of the device. The capsule was lysed sharply and the device was removed with a hemostat. A new Nexplanon applicator was inserted into the incision site subdermaly, and the Nexplanon device was delivered. Proper placement was confirmed by palpation. Hemostasis was achieved. The incision site was closed with with a steristrip and a pressure dressing was applied. The patient tolerated the procedure well.  Standard post-procedure care and precautions were reviewed. The patient verbalized understanding.  Guadlupe Spanish, CNM

## 2022-01-16 ENCOUNTER — Ambulatory Visit: Payer: Medicaid Other

## 2022-01-21 ENCOUNTER — Ambulatory Visit: Payer: Medicaid Other

## 2022-01-21 DIAGNOSIS — M79605 Pain in left leg: Secondary | ICD-10-CM

## 2022-01-21 DIAGNOSIS — R2689 Other abnormalities of gait and mobility: Secondary | ICD-10-CM | POA: Diagnosis not present

## 2022-01-21 DIAGNOSIS — M6281 Muscle weakness (generalized): Secondary | ICD-10-CM
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
## 2022-01-22 ENCOUNTER — Encounter: Payer: Medicaid Other | Admitting: Obstetrics

## 2022-01-23 ENCOUNTER — Ambulatory Visit: Payer: Medicaid Other

## 2022-01-23 DIAGNOSIS — R2689 Other abnormalities of gait and mobility: Secondary | ICD-10-CM | POA: Diagnosis not present

## 2022-01-23 DIAGNOSIS — M79605 Pain in left leg: Secondary | ICD-10-CM

## 2022-01-23 DIAGNOSIS — M6281 Muscle weakness (generalized): Secondary | ICD-10-CM
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
## 2022-01-29 ENCOUNTER — Ambulatory Visit: Payer: Medicaid Other

## 2022-01-29 DIAGNOSIS — M6281 Muscle weakness (generalized): Secondary | ICD-10-CM

## 2022-01-29 DIAGNOSIS — M79605 Pain in left leg: Secondary | ICD-10-CM

## 2022-01-29 DIAGNOSIS — R2681 Unsteadiness on feet: Secondary | ICD-10-CM

## 2022-01-29 DIAGNOSIS — R262 Difficulty in walking, not elsewhere classified: Secondary | ICD-10-CM

## 2022-01-29 DIAGNOSIS — R2689 Other abnormalities of gait and mobility: Secondary | ICD-10-CM | POA: Diagnosis not present
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
## 2022-02-05 ENCOUNTER — Ambulatory Visit: Payer: Medicaid Other

## 2022-02-05 DIAGNOSIS — R2689 Other abnormalities of gait and mobility: Secondary | ICD-10-CM

## 2022-02-05 DIAGNOSIS — M6281 Muscle weakness (generalized): Secondary | ICD-10-CM

## 2022-02-05 DIAGNOSIS — M79605 Pain in left leg: Secondary | ICD-10-CM
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
## 2022-02-07 ENCOUNTER — Ambulatory Visit: Payer: Medicaid Other

## 2022-02-07 DIAGNOSIS — R2689 Other abnormalities of gait and mobility: Secondary | ICD-10-CM | POA: Diagnosis not present

## 2022-02-07 DIAGNOSIS — M79605 Pain in left leg: Secondary | ICD-10-CM

## 2022-02-07 DIAGNOSIS — M6281 Muscle weakness (generalized): Secondary | ICD-10-CM
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
## 2022-02-10 ENCOUNTER — Ambulatory Visit: Payer: Medicaid Other | Attending: General Surgery

## 2022-02-10 DIAGNOSIS — R2689 Other abnormalities of gait and mobility: Secondary | ICD-10-CM | POA: Diagnosis present

## 2022-02-10 DIAGNOSIS — M79605 Pain in left leg: Secondary | ICD-10-CM | POA: Insufficient documentation

## 2022-02-10 DIAGNOSIS — M6281 Muscle weakness (generalized): Secondary | ICD-10-CM | POA: Insufficient documentation
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
## 2022-02-12 ENCOUNTER — Ambulatory Visit: Payer: Medicaid Other

## 2022-02-18 ENCOUNTER — Ambulatory Visit: Payer: Medicaid Other

## 2022-02-20 ENCOUNTER — Ambulatory Visit: Payer: Medicaid Other

## 2022-02-20 DIAGNOSIS — M79605 Pain in left leg: Secondary | ICD-10-CM

## 2022-02-20 DIAGNOSIS — R2689 Other abnormalities of gait and mobility: Secondary | ICD-10-CM | POA: Diagnosis not present

## 2022-02-20 DIAGNOSIS — M6281 Muscle weakness (generalized): Secondary | ICD-10-CM
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
## 2022-02-24 ENCOUNTER — Encounter: Payer: Medicaid Other | Admitting: Obstetrics

## 2022-02-25 ENCOUNTER — Ambulatory Visit: Payer: Medicaid Other

## 2022-02-25 DIAGNOSIS — R2689 Other abnormalities of gait and mobility: Secondary | ICD-10-CM | POA: Diagnosis not present

## 2022-02-25 DIAGNOSIS — M6281 Muscle weakness (generalized): Secondary | ICD-10-CM

## 2022-02-25 DIAGNOSIS — M79605 Pain in left leg: Secondary | ICD-10-CM
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
## 2022-02-27 ENCOUNTER — Ambulatory Visit: Payer: Medicaid Other

## 2022-02-27 DIAGNOSIS — R2689 Other abnormalities of gait and mobility: Secondary | ICD-10-CM

## 2022-02-27 DIAGNOSIS — M79605 Pain in left leg: Secondary | ICD-10-CM
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
## 2022-03-05 ENCOUNTER — Other Ambulatory Visit (HOSPITAL_COMMUNITY)
Admission: RE | Admit: 2022-03-05 | Discharge: 2022-03-05 | Disposition: A | Discharge status: Home / Self Care | Payer: Medicaid Other | Source: Ambulatory Visit | Attending: Obstetrics | Admitting: Obstetrics

## 2022-03-05 ENCOUNTER — Encounter: Payer: Self-pay | Admitting: Obstetrics

## 2022-03-05 ENCOUNTER — Ambulatory Visit (INDEPENDENT_AMBULATORY_CARE_PROVIDER_SITE_OTHER): Payer: Medicaid Other | Admitting: Obstetrics

## 2022-03-05 VITALS — BP 120/80 | HR 88 | Ht 63.0 in | Wt 253.7 lb

## 2022-03-05 DIAGNOSIS — K588 Other irritable bowel syndrome: Secondary | ICD-10-CM

## 2022-03-05 DIAGNOSIS — Z01419 Encounter for gynecological examination (general) (routine) without abnormal findings: Secondary | ICD-10-CM | POA: Diagnosis not present

## 2022-03-05 DIAGNOSIS — Z113 Encounter for screening for infections with a predominantly sexual mode of transmission: Secondary | ICD-10-CM

## 2022-03-05 DIAGNOSIS — Z124 Encounter for screening for malignant neoplasm of cervix: Secondary | ICD-10-CM | POA: Diagnosis not present
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
## 2022-03-06 LAB — CBC WITH DIFFERENTIAL/PLATELET
Basophils Absolute: 0 10*3/uL (ref 0.0–0.2)
Basos: 0 %
EOS (ABSOLUTE): 0.1 10*3/uL (ref 0.0–0.4)
Eos: 1 %
Hematocrit: 37.9 % (ref 34.0–46.6)
Hemoglobin: 13 g/dL (ref 11.1–15.9)
Immature Grans (Abs): 0 10*3/uL (ref 0.0–0.1)
Immature Granulocytes: 0 %
Lymphocytes Absolute: 3.3 10*3/uL — ABNORMAL HIGH (ref 0.7–3.1)
Lymphs: 31 %
MCH: 27.8 pg (ref 26.6–33.0)
MCHC: 34.3 g/dL (ref 31.5–35.7)
MCV: 81 fL (ref 79–97)
Monocytes Absolute: 0.8 10*3/uL (ref 0.1–0.9)
Monocytes: 7 %
Neutrophils Absolute: 6.4 10*3/uL (ref 1.4–7.0)
Neutrophils: 61 %
Platelets: 334 10*3/uL (ref 150–450)
RBC: 4.67 x10E6/uL (ref 3.77–5.28)
RDW: 13.5 % (ref 11.7–15.4)
WBC: 10.6 10*3/uL (ref 3.4–10.8)

## 2022-03-06 LAB — HEPATITIS B SURFACE ANTIGEN: Hepatitis B Surface Ag: NEGATIVE

## 2022-03-06 LAB — LIPID PANEL
Chol/HDL Ratio: 6.2 ratio — ABNORMAL HIGH (ref 0.0–4.4)
Cholesterol, Total: 174 mg/dL (ref 100–199)
HDL: 28 mg/dL — ABNORMAL LOW (ref >39)
LDL Chol Calc (NIH): 96 mg/dL (ref 0–99)
Triglycerides: 296 mg/dL — ABNORMAL HIGH (ref 0–149)
VLDL Cholesterol Cal: 50 mg/dL — ABNORMAL HIGH (ref 5–40)

## 2022-03-06 LAB — RPR: RPR Ser Ql: NONREACTIVE

## 2022-03-06 LAB — HEPATITIS C ANTIBODY: Hep C Virus Ab: NONREACTIVE

## 2022-03-06 LAB — HEPATITIS B SURFACE ANTIBODY,QUALITATIVE: Hep B Surface Ab, Qual: REACTIVE

## 2022-03-06 LAB — HEMOGLOBIN A1C
Est. average glucose Bld gHb Est-mCnc: 108 mg/dL
Hgb A1c MFr Bld: 5.4 % (ref 4.8–5.6)

## 2022-03-06 LAB — HIV ANTIBODY (ROUTINE TESTING W REFLEX): HIV Screen 4th Generation wRfx: NONREACTIVE

## 2022-03-06 LAB — TSH: TSH: 1.29 u[IU]/mL (ref 0.450–4.500)

## 2022-03-07 ENCOUNTER — Encounter: Payer: Self-pay | Admitting: Obstetrics

## 2022-03-07 ENCOUNTER — Other Ambulatory Visit: Payer: Self-pay | Admitting: Obstetrics

## 2022-03-07 LAB — CERVICOVAGINAL ANCILLARY ONLY
Bacterial Vaginitis (gardnerella): POSITIVE — AB
Candida Glabrata: NEGATIVE
Candida Vaginitis: NEGATIVE
Chlamydia: NEGATIVE
Comment: NEGATIVE
Comment: NEGATIVE
Comment: NEGATIVE
Comment: NEGATIVE
Comment: NEGATIVE
Comment: NORMAL
Neisseria Gonorrhea: NEGATIVE
Trichomonas: NEGATIVE

## 2022-03-07 MED ORDER — METRONIDAZOLE 500 MG PO TABS: 500.0000 mg | ORAL_TABLET | Freq: Two times a day (BID) | ORAL | 0 refills | Status: DC

## 2022-03-07 NOTE — Progress Notes (Signed)
+  BV. Rx for metronidazole sent to pharmacy. Joni notified via MyChart.  M. Chryl Heck, CNM

## 2022-03-10 ENCOUNTER — Encounter: Payer: Self-pay | Admitting: Obstetrics

## 2022-03-10 LAB — CYTOLOGY - PAP: Diagnosis: NEGATIVE

## 2022-05-08 ENCOUNTER — Ambulatory Visit: Payer: Medicaid Other | Admitting: Nurse Practitioner

## 2022-06-18 ENCOUNTER — Ambulatory Visit: Payer: Medicaid Other | Admitting: Family Medicine

## 2022-06-18 ENCOUNTER — Encounter: Payer: Self-pay | Admitting: Family Medicine

## 2022-06-18 VITALS — BP 115/72 | HR 68 | Temp 97.8°F | Ht 63.0 in | Wt 254.0 lb

## 2022-06-18 DIAGNOSIS — Z1159 Encounter for screening for other viral diseases: Secondary | ICD-10-CM

## 2022-06-18 DIAGNOSIS — Z0000 Encounter for general adult medical examination without abnormal findings: Secondary | ICD-10-CM | POA: Diagnosis not present

## 2022-06-18 DIAGNOSIS — R1084 Generalized abdominal pain: Secondary | ICD-10-CM

## 2022-06-18 DIAGNOSIS — Z114 Encounter for screening for human immunodeficiency virus [HIV]: Secondary | ICD-10-CM | POA: Diagnosis not present

## 2022-06-18 DIAGNOSIS — Z6841 Body Mass Index (BMI) 40.0 and over, adult: Secondary | ICD-10-CM

## 2022-06-18 NOTE — Progress Notes (Signed)
New Patient Office Visit  Subjective    Patient ID: Jodi Terrell, female    DOB: [birth year]  Age: 26 y.o. MRN: [id number]  CC:  Chief Complaint  Patient presents with   Establish Care    HPI Jodi Terrell presents to establish care. Reoriented to practice routines and expectations. Last physical and labs over 1 year. Recent PAP July 26 normal with BV that was treated. She was referred to gastroenterology for bleeding hemorrhoids and stomach aches. She reports having BMs 5-8 times a day and consistency varies from watery to soft ongoing for years. Appointment 12/5 with Dr Servando Snare. We did discuss her weight today and she reports eating one meal a day "whatever is there" and no exercise routine but is motivated to lose 40 pounds.   Outpatient Encounter Medications as of 06/18/2022  Medication Sig   [DISCONTINUED] calcium citrate (CALCITRATE - DOSED IN MG ELEMENTAL CALCIUM) 950 (200 Ca) MG tablet Take 1 tablet (200 mg of elemental calcium total) by mouth 2 (two) times daily.   [DISCONTINUED] docusate sodium (COLACE) 100 MG capsule Take 1 capsule (100 mg total) by mouth 2 (two) times daily as needed for mild constipation or moderate constipation. (Patient not taking: Reported on 03/05/2022)   [DISCONTINUED] metroNIDAZOLE (FLAGYL) 500 MG tablet Take 1 tablet (500 mg total) by mouth 2 (two) times daily. (Patient not taking: Reported on 06/18/2022)   [DISCONTINUED] polyethylene glycol (MIRALAX / GLYCOLAX) 17 g packet Take 17 g by mouth daily as needed for mild constipation or moderate constipation. (Patient not taking: Reported on 03/05/2022)   No facility-administered encounter medications on file as of 06/18/2022.    Past Medical History:  Diagnosis Date   Arrhythmia    with pneumonia   Chlamydia trachomatis infection during pregnancy in third trimester 09/30/2018   Exposure to chlamydia 05/11/2018   Partner tested positive  Treated in MAU 05/11/18   Group B streptococcal infection  during pregnancy 09/27/2018   Treat in labor   Heart murmur    as a child   LGA (large for gestational age) fetus affecting management of mother, third trimester 10/14/2018   Maternal morbid obesity, antepartum (HCC) 11/30/2013   Recommendations [x]  Aspirin 81 mg daily after 12 weeks; discontinue after 36 weeks [x]  Nutrition consult [ ]  Weight gain 11-20 lbs for singleton and 25-35 lbs for twin pregnancy (IOM guidelines)  Higher class of obesity patients recommended to gain closer to lower limit   Weight loss is associated with adverse outcomes [ ]  Baseline and surveillance labs (pulled in from [facility], refresh links as nee   Pneumonia    09/01/14- 3- 4 years ago   Polyhydramnios in singleton pregnancy in third trimester 10/21/2018   Polyhydramnios in third trimester 10/14/2018   Rubella non-immune status, antepartum 03/30/2018   Seasonal allergies    Supervision of other normal pregnancy, antepartum 03/22/2018    Nursing Staff Provider  Office Location [facility]  Dating  6 week sono  Language  English Anatomy 12/21/2018   Normal  Flu Vaccine  05/17/2018 Genetic Screen  NIPS: low risks   AFP: neg    TDaP vaccine   09/08/2018 Hgb A1C or  GTT Early A1c 4.9 Third trimester nl 2 hour  Rhogam     LAB RESULTS   Feeding Plan Breast Blood Type O/Positive/-- (08/12 1113)   Contraception nexplanon Antibody Negative (08/12 11    Past Surgical History:  Procedure Laterality Date   CLOSED REDUCTION NASAL FRACTURE N/A 09/01/2014  Procedure: CLOSED REDUCTION NASAL FRACTURE;  Surgeon: Glenna Fellows, MD;  Location: [facility] OR;  Service: Plastics;  Laterality: N/A;   FEMUR IM NAIL Left 12/03/2021   Procedure: INTRAMEDULLARY (IM) RETRONAIL FEMORAL;  Surgeon: Myrene Galas, MD;  Location: [facility] OR;  Service: Orthopedics;  Laterality: Left;   TONSILLECTOMY Bilateral 10/25/2020   Procedure: TONSILLECTOMY;  Surgeon: Vernie Murders, MD;  Location[facility];  Service: ENT;  Laterality: Bilateral;   TYMPANOSTOMY TUBE  PLACEMENT      Family History  Problem Relation Age of Onset   Hypertension Brother    Diabetes Maternal Grandfather    Cancer Paternal Grandmother    Diabetes Paternal Grandfather     Social History   Socioeconomic History   Marital status: Single    Spouse name: Not on file   Number of children: Not on file   Years of education: Not on file   Highest education level: Not on file  Occupational History   Not on file  Tobacco Use   Smoking status: Former    Types: Cigarettes    Quit date: 11/02/2010    Years since quitting: 11.6   Smokeless tobacco: Never  Vaping Use   Vaping Use: Never used  Substance and Sexual Activity   Alcohol use: No   Drug use: Not Currently    Types: Marijuana    Comment: Last use Sunday, 45/23/23 Hemp   Sexual activity: Yes    Partners: Male    Birth control/protection: Implant    Comment: Nexplanon in Left upper long  Other Topics Concern   Not on file  Social History Narrative   Not on file   Social Determinants of Health   Financial Resource Strain: Low Risk  (10/18/2018)   Overall Financial Resource Strain (CARDIA)    Difficulty of Paying Living Expenses: Not hard at all  Food Insecurity: No Food Insecurity (10/18/2018)   Hunger Vital Sign    Worried About Running Out of Food in the Last Year: Never true    Ran Out of Food in the Last Year: Never true  Transportation Needs: Unknown (10/18/2018)   PRAPARE - Administrator, Civil Service (Medical): No    Lack of Transportation (Non-Medical): Not on file  Physical Activity: Not on file  Stress: No Stress Concern Present (10/18/2018)   [employer] of Occupational Health - Occupational Stress Questionnaire    Feeling of Stress : Not at all  Social Connections: Not on file  Intimate Partner Violence: Not At Risk (10/18/2018)   Humiliation, Afraid, Rape, and Kick questionnaire    Fear of Current or Ex-Partner: No    Emotionally Abused: No    Physically Abused: No     Sexually Abused: No    Review of Systems  Gastrointestinal:  Positive for abdominal pain and diarrhea.  All other systems reviewed and are negative.       Objective    BP 115/72   Pulse 68   Temp 97.8 F (36.6 C) (Oral)   Ht 5\' 3"  (1.6 m)   Wt 254 lb (115.2 kg)   SpO2 99%   BMI 44.99 kg/m   Physical Exam Vitals and nursing note reviewed.  Constitutional:      Appearance: Normal appearance. She is obese.  HENT:     Head: Normocephalic and atraumatic.     Right Ear: Tympanic membrane, ear canal and external ear normal.     Left Ear: Tympanic membrane, ear canal and external ear normal.  Nose: Nose normal.     Mouth/Throat:     Mouth: Mucous membranes are moist.     Pharynx: Oropharynx is clear.  Eyes:     Extraocular Movements: Extraocular movements intact.     Conjunctiva/sclera: Conjunctivae normal.     Pupils: Pupils are equal, round, and reactive to light.  Cardiovascular:     Rate and Rhythm: Normal rate and regular rhythm.     Pulses: Normal pulses.     Heart sounds: Normal heart sounds.  Pulmonary:     Effort: Pulmonary effort is normal.     Breath sounds: Normal breath sounds.  Abdominal:     General: Bowel sounds are normal.     Palpations: Abdomen is soft.  Musculoskeletal:        General: Normal range of motion.     Cervical back: Normal range of motion and neck supple.  Skin:    General: Skin is warm and dry.     Capillary Refill: Capillary refill takes less than 2 seconds.  Neurological:     General: No focal deficit present.     Mental Status: She is alert and oriented to person, place, and time. Mental status is at baseline.  Psychiatric:        Mood and Affect: Mood normal.        Behavior: Behavior normal.        Thought Content: Thought content normal.        Judgment: Judgment normal.        Assessment & Plan:   1. Physical exam, annual Routine labs drawn today and will address as indicated. She is up to date on cervical  cancer screening and STI screen. Vaccines UTD. - CBC with Differential/Platelet - COMPLETE METABOLIC PANEL WITH GFR - Lipid panel - TSH - Hepatitis C antibody - HIV Antibody (routine testing w rflx)  2. Screening for HIV without presence of risk factors - HIV Antibody (routine testing w rflx)  3. Encounter for hepatitis C screening test for low risk patient - Hepatitis C antibody  4. Morbid obesity with BMI of 40.0-44.9, adult [facility]) Discussed the importance of maintaining a healthy weight for overall health. Encouraged calorie restricted, heart healthy diet and 150 minutes exercise per week. She is motivated to lose weight and we discussed a referral to medical weight management. - Amb Ref to Medical Weight Management  5. Generalized abdominal pain Ongoing abdominal pain and diarrhea. Has an appointment with gastrenterology next month.    Return in about 1 year (around 06/19/2023) for annual physical.   Park Meo, FNP

## 2022-06-19 LAB — CBC WITH DIFFERENTIAL/PLATELET
Absolute Monocytes: 726 cells/uL (ref 200–950)
Basophils Absolute: 24 cells/uL (ref 0–200)
Basophils Relative: 0.2 %
Eosinophils Absolute: 107 cells/uL (ref 15–500)
Eosinophils Relative: 0.9 %
HCT: 36.5 % (ref 35.0–45.0)
Hemoglobin: 12.2 g/dL (ref 11.7–15.5)
Lymphs Abs: 2118 cells/uL (ref 850–3900)
MCH: 27.9 pg (ref 27.0–33.0)
MCHC: 33.4 g/dL (ref 32.0–36.0)
MCV: 83.5 fL (ref 80.0–100.0)
MPV: 11.1 fL (ref 7.5–12.5)
Monocytes Relative: 6.1 %
Neutro Abs: 8925 cells/uL — ABNORMAL HIGH (ref 1500–7800)
Neutrophils Relative %: 75 %
Platelets: 255 10*3/uL (ref 140–400)
RBC: 4.37 10*6/uL (ref 3.80–5.10)
RDW: 12.9 % (ref 11.0–15.0)
Total Lymphocyte: 17.8 %
WBC: 11.9 10*3/uL — ABNORMAL HIGH (ref 3.8–10.8)

## 2022-06-19 LAB — COMPLETE METABOLIC PANEL WITH GFR
AG Ratio: 1.6 (calc) (ref 1.0–2.5)
ALT: 8 U/L (ref 6–29)
AST: 10 U/L (ref 10–30)
Albumin: 4.1 g/dL (ref 3.6–5.1)
Alkaline phosphatase (APISO): 128 U/L — ABNORMAL HIGH (ref 31–125)
BUN: 8 mg/dL (ref 7–25)
CO2: 26 mmol/L (ref 20–32)
Calcium: 8.9 mg/dL (ref 8.6–10.2)
Chloride: 106 mmol/L (ref 98–110)
Creat: 0.57 mg/dL (ref 0.50–0.96)
Globulin: 2.6 g/dL (calc) (ref 1.9–3.7)
Glucose, Bld: 89 mg/dL (ref 65–99)
Potassium: 4.3 mmol/L (ref 3.5–5.3)
Sodium: 141 mmol/L (ref 135–146)
Total Bilirubin: 0.6 mg/dL (ref 0.2–1.2)
Total Protein: 6.7 g/dL (ref 6.1–8.1)
eGFR: 128 mL/min/{1.73_m2} (ref >=60)

## 2022-06-19 LAB — HIV ANTIBODY (ROUTINE TESTING W REFLEX): HIV 1&2 Ab, 4th Generation: NONREACTIVE

## 2022-06-19 LAB — HEPATITIS C ANTIBODY: Hepatitis C Ab: NONREACTIVE

## 2022-06-19 LAB — LIPID PANEL
Cholesterol: 160 mg/dL (ref <200)
HDL: 38 mg/dL — ABNORMAL LOW (ref >=50)
LDL Cholesterol (Calc): 103 mg/dL (calc) — ABNORMAL HIGH
Non-HDL Cholesterol (Calc): 122 mg/dL (calc) (ref <130)
Total CHOL/HDL Ratio: 4.2 (calc) (ref <5.0)
Triglycerides: 99 mg/dL (ref <150)

## 2022-06-19 LAB — TSH: TSH: 1.94 mIU/L

## 2022-06-24 ENCOUNTER — Encounter (INDEPENDENT_AMBULATORY_CARE_PROVIDER_SITE_OTHER): Payer: Self-pay

## 2022-07-15 ENCOUNTER — Ambulatory Visit: Payer: Medicaid Other | Admitting: Gastroenterology

## 2022-07-22 ENCOUNTER — Ambulatory Visit: Payer: Medicaid Other | Admitting: Family Medicine

## 2022-07-23 ENCOUNTER — Ambulatory Visit: Payer: Medicaid Other | Admitting: Family Medicine

## 2022-07-23 VITALS — BP 126/62 | HR 85 | Ht 63.0 in | Wt 254.0 lb

## 2022-07-23 DIAGNOSIS — N760 Acute vaginitis: Secondary | ICD-10-CM

## 2022-07-23 DIAGNOSIS — N898 Other specified noninflammatory disorders of vagina: Secondary | ICD-10-CM

## 2022-07-23 DIAGNOSIS — B9689 Other specified bacterial agents as the cause of diseases classified elsewhere: Secondary | ICD-10-CM

## 2022-07-23 LAB — WET PREP FOR TRICH, YEAST, CLUE

## 2022-07-23 MED ORDER — METRONIDAZOLE 500 MG PO TABS: 500.0000 mg | ORAL_TABLET | Freq: Two times a day (BID) | ORAL | 0 refills | Status: AC

## 2022-07-23 NOTE — Assessment & Plan Note (Signed)
Wet prep performed for vaginal symptoms and showed presence of clue cells. Thin, white vaginal discharge also present as well as positive whiff test. Will treat for BV with Metronidazole 500mg  BID for 7 days. Encouraged to use women's health probiotics to prevent recurrence or for early erradication. Instructed to return to office if symptoms persist or worsen.

## 2022-07-23 NOTE — Progress Notes (Signed)
   Acute Office Visit  Subjective:     Patient ID: Jodi Terrell, female    DOB: 15-Mar-1996, 26 y.o.   MRN: [id number]  Chief Complaint  Patient presents with   Vaginal Itching    Pt c/o vaginal itching/irritation since Saturday.     Vaginal Itching   Patient is in today for vaginal itching and irritation since Saturday with some burning with urination. Denies discharge, bleeding, pelvic pain, fever.  She has a history of BV several months ago that was relieved with a course of antibiotics.  Review of Systems  All other systems reviewed and are negative.       Objective:    BP 126/62   Pulse 85   Ht 5\' 3"  (1.6 m)   Wt 254 lb (115.2 kg)   SpO2 96%   BMI 44.99 kg/m    Physical Exam Vitals and nursing note reviewed. Exam conducted with a chaperone present.  Constitutional:      Appearance: Normal appearance. She is normal weight.  HENT:     Head: Normocephalic and atraumatic.  Genitourinary:    General: Normal vulva.     Vagina: Vaginal discharge present.     Rectum: Normal.     Comments: Thin white Skin:    General: Skin is warm and dry.  Neurological:     General: No focal deficit present.     Mental Status: She is alert and oriented to person, place, and time. Mental status is at baseline.  Psychiatric:        Mood and Affect: Mood normal.        Behavior: Behavior normal.        Thought Content: Thought content normal.        Judgment: Judgment normal.     No results found for any visits on 07/23/22. Microscopic wet-mount exam shows clue cells, excessive bacteria, white blood cells.      Assessment & Plan:   Problem List Items Addressed This Visit       Genitourinary   Bacterial vaginosis - Primary    Wet prep performed for vaginal symptoms and showed presence of clue cells. Thin, white vaginal discharge also present as well as positive whiff test. Will treat for BV with Metronidazole 500mg  BID for 7 days. Encouraged to use women's health  probiotics to prevent recurrence or for early erradication. Instructed to return to office if symptoms persist or worsen.      Relevant Medications   metroNIDAZOLE (FLAGYL) 500 MG tablet   Other Visit Diagnoses     Vaginal irritation       Relevant Orders   WET PREP FOR TRICH, YEAST, CLUE       Meds ordered this encounter  Medications   metroNIDAZOLE (FLAGYL) 500 MG tablet    Sig: Take 1 tablet (500 mg total) by mouth 2 (two) times daily for 7 days.    Dispense:  14 tablet    Refill:  0    Order Specific Question:   Supervising Provider    Answer:   07/25/22 T [3002]    Return if symptoms worsen or fail to improve.  , FNP

## 2022-08-07 ENCOUNTER — Telehealth: Payer: Self-pay

## 2022-08-07 NOTE — Progress Notes (Deleted)
    NURSE VISIT NOTE  Subjective:    Patient ID: Jodi Terrell, female    DOB: 12-May-1996, 26 y.o.   MRN: [id number]  HPI  Patient is a 26 y.o. [name] female who presents for {pe vag discharge desc:315065} vaginal discharge for *** {gen duration:315003}. Denies abnormal vaginal bleeding or significant pelvic pain or fever. {Actions; denies/reports/admits to:19208} {UTI Symptoms:210800002}. Patient {has/denies:315300} history of known exposure to STD.   Objective:    There were no vitals taken for this visit.   @THIS  VISIT ONLY@  Assessment:   No diagnosis found.  {vaginitis type:315262}  Plan:   GC and chlamydia DNA  probe sent to lab. Treatment: {vaginitis tx:315263} ROV prn if symptoms persist or worsen.   , CMA

## 2022-08-08 ENCOUNTER — Ambulatory Visit: Payer: Medicaid Other

## 2022-08-13 ENCOUNTER — Ambulatory Visit: Payer: Medicaid Other | Admitting: Obstetrics

## 2022-08-14 ENCOUNTER — Ambulatory Visit: Payer: Medicaid Other

## 2022-08-21 ENCOUNTER — Encounter (INDEPENDENT_AMBULATORY_CARE_PROVIDER_SITE_OTHER): Payer: Medicaid Other | Admitting: Internal Medicine

## 2022-11-06 ENCOUNTER — Encounter (INDEPENDENT_AMBULATORY_CARE_PROVIDER_SITE_OTHER): Payer: Medicaid Other | Admitting: Internal Medicine

## 2023-01-06 ENCOUNTER — Encounter (INDEPENDENT_AMBULATORY_CARE_PROVIDER_SITE_OTHER): Payer: Medicaid Other | Admitting: Family Medicine

## 2023-06-17 ENCOUNTER — Ambulatory Visit
Admission: EM | Admit: 2023-06-17 | Discharge: 2023-06-17 | Disposition: A | Discharge status: Home / Self Care | Payer: Medicaid Other

## 2023-06-17 ENCOUNTER — Encounter: Payer: Self-pay | Admitting: Emergency Medicine

## 2023-06-17 DIAGNOSIS — J0100 Acute maxillary sinusitis, unspecified: Secondary | ICD-10-CM

## 2023-06-17 MED ORDER — AMOXICILLIN-POT CLAVULANATE 875-125 MG PO TABS: 1.0000 | ORAL_TABLET | Freq: Two times a day (BID) | ORAL | 0 refills | Status: DC

## 2023-06-17 NOTE — ED Triage Notes (Signed)
Nasal congestion, sore throat, headache x 3 weeks.  Has been taking zyrtec and nasal spray without relief.
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
## 2023-06-17 NOTE — Discharge Instructions (Signed)
Take Zyrtec and Flonase every day to control seasonal allergies, use saline sinus rinses 1-2 times daily as needed, take decongestants as needed additionally.

## 2023-06-18 ENCOUNTER — Ambulatory Visit: Payer: Medicaid Other | Admitting: Family Medicine

## 2023-06-19 ENCOUNTER — Ambulatory Visit (INDEPENDENT_AMBULATORY_CARE_PROVIDER_SITE_OTHER): Payer: Medicaid Other

## 2023-06-19 VITALS — BP 120/83 | HR 92 | Ht 61.0 in | Wt 267.1 lb

## 2023-06-19 DIAGNOSIS — Z202 Contact with and (suspected) exposure to infections with a predominantly sexual mode of transmission: Secondary | ICD-10-CM

## 2023-06-19 MED ORDER — CEFTRIAXONE SODIUM 500 MG IJ SOLR
500.0000 mg | Freq: Once | INTRAMUSCULAR | Status: AC
  Administered 2023-06-19: 500 mg via INTRAMUSCULAR

## 2023-06-19 NOTE — Patient Instructions (Addendum)
Gonorrhea - No sex for 7 days -Partner needs to be treated -Encourage condom use -Documentation to [facility] Dept will be sent awaiting results of swab sent today.  - F/U in 4 weeks for TOC  Rocephin (Ceftriaxone) 500 mg IM single dose for persons weighing <330 lbs.   -Persons weighing > 330lbs need 1g IM Rocephin  +Azithromycin 1g    Safe Sex Practicing safe sex means taking steps before and during sex to reduce your risk of: Getting an STI (sexually transmitted infection). Giving your partner an STI. Unwanted or unplanned pregnancy. How to practice safe sex Ways you can practice safe sex  Limit your sexual partners to only one partner who is having sex with only you. Avoid using alcohol and drugs before having sex. Alcohol and drugs can affect your judgment. Before having sex with a new partner: Talk to your partner about past partners, past STIs, and drug use. Get screened for STIs and discuss the results with your partner. Ask your partner to get screened too. Check your body regularly for sores, blisters, rashes, or unusual discharge. If you notice any of these problems, visit your health care provider. Avoid sexual contact if you have symptoms of an infection or you are being treated for an STI. While having sex, use a condom. Make sure to: Use a condom every time you have vaginal, oral, or anal sex. Both females and males should wear condoms during oral sex. Keep condoms in place from the beginning to the end of sexual activity. Use a latex condom, if possible. Latex condoms offer the best protection. Use only water-based lubricants with a condom. Using petroleum-based lubricants or oils will weaken the condom and increase the chance that it will break. Ways your health care provider can help you practice safe sex  See your health care provider for regular screenings, exams, and tests for STIs. Talk with your health care provider about what kind of birth  control (contraception) is best for you. Get vaccinated against hepatitis B and human papillomavirus (HPV). If you are at risk of being infected with HIV (human immunodeficiency virus), talk with your health care provider about taking a prescription medicine to prevent HIV infection. You are at risk for HIV if you: Are a man who has sex with other men. Are sexually active with more than one partner. Take drugs by injection. Have a sex partner who has HIV. Have unprotected sex. Have sex with someone who has sex with both men and women. Have had an STI. Follow these instructions at home: Take over-the-counter and prescription medicines only as told by your health care provider. Keep all follow-up visits. This is important. Where to find more information Centers for Disease Control and Prevention: FootballExhibition.com.br Planned Parenthood: www.plannedparenthood.org Office on [employer] Health: http://hoffman.com/ Summary Practicing safe sex means taking steps before and during sex to reduce your risk getting an STI, giving your partner an STI, and having an unwanted or unplanned pregnancy. Before having sex with a new partner, talk to your partner about past partners, past STIs, and drug use. Use a condom every time you have vaginal, oral, or anal sex. Both females and males should wear condoms during oral sex. Check your body regularly for sores, blisters, rashes, or unusual discharge. If you notice any of these problems, visit your health care provider. See your health care provider for regular screenings, exams, and tests for STIs. This information is not intended to replace advice given to you by your health  care provider. Make sure you discuss any questions you have with your health care provider. Document Revised: 01/02/2020 Document Reviewed: 01/02/2020 Elsevier Patient Education  2024 [employer].

## 2023-06-19 NOTE — Progress Notes (Signed)
    NURSE VISIT NOTE  Subjective:    Patient ID: Jodi Terrell, female    DOB: 16-Feb-1996, 27 y.o.   MRN: [id number]  HPI  Patient is a 27 y.o. [phone] female who presents for STD screening, states that she engaged in oral sex with multiple partners and was notified today that one of her partners tested positive for Gonorrhea, she denies any further vaginal symptoms but states that she has a sore throat Denies abnormal vaginal bleeding or significant pelvic pain or fever. Per Autumn Messing, CNM patient should be treated in office today with Rocephin  Patient has history of known exposure to STD.   Objective:    BP 120/83   Pulse 92   Ht 5\' 1"  (1.549 m)   Wt 267 lb 1.6 oz (121.2 kg)   LMP 06/14/2023   BMI 50.47 kg/m      Assessment:   1. Exposure to gonorrhea     rule out GC or chlamydia  Plan:   GC and chlamydia DNA  probe sent to lab. Treatment: abstain from coitus during course of treatment ROV prn if symptoms persist or worsen.   Fonda Kinder, CMA

## 2023-06-23 ENCOUNTER — Other Ambulatory Visit: Payer: Self-pay | Admitting: Obstetrics and Gynecology

## 2023-06-23 ENCOUNTER — Telehealth: Payer: Self-pay

## 2023-06-23 DIAGNOSIS — A749 Chlamydial infection, unspecified: Secondary | ICD-10-CM | POA: Insufficient documentation

## 2023-06-23 LAB — CT/GC NAA, PHARYNGEAL
C TRACH RRNA NPH QL PCR: POSITIVE — AB
N GONORRHOEA RRNA NPH QL PCR: NEGATIVE

## 2023-06-23 MED ORDER — AZITHROMYCIN 500 MG PO TABS: 1000.0000 mg | ORAL_TABLET | Freq: Once | ORAL | 1 refills | Status: AC

## 2023-06-23 NOTE — Telephone Encounter (Signed)
Pt calling for lab results.  [phone]  Left generic msg that lab results were not back yet.

## 2023-07-16 ENCOUNTER — Ambulatory Visit (INDEPENDENT_AMBULATORY_CARE_PROVIDER_SITE_OTHER): Payer: Medicaid Other

## 2023-07-16 ENCOUNTER — Other Ambulatory Visit (HOSPITAL_COMMUNITY)
Admission: RE | Admit: 2023-07-16 | Discharge: 2023-07-16 | Disposition: A | Discharge status: Home / Self Care | Payer: Medicaid Other | Source: Ambulatory Visit | Attending: Obstetrics and Gynecology | Admitting: Obstetrics and Gynecology

## 2023-07-16 VITALS — BP 116/80 | HR 92 | Ht 61.0 in | Wt 271.0 lb

## 2023-07-16 DIAGNOSIS — N898 Other specified noninflammatory disorders of vagina: Secondary | ICD-10-CM

## 2023-07-16 DIAGNOSIS — A749 Chlamydial infection, unspecified: Secondary | ICD-10-CM

## 2023-07-16 NOTE — Progress Notes (Signed)
    NURSE VISIT NOTE  Subjective:    Patient ID: EMILCE STADTLER, female    DOB: 07/17/1996, 27 y.o.   MRN: [id number]  HPI  Patient is a 27 y.o. [phone] female who presents for test of cure for pharyngeal chlamydia  pt requesting vagina swab states she is feeling itchy and having white discharge due to antibiotics she has taken and is prone to yeast infections, stated she used monistat last nigh as well.Patient verified her label for ancillary swab and  pharyngeal swab that she self collected in office.     Objective:    Ht 5\' 1"  (1.549 m)   Wt 271 lb (122.9 kg)   LMP 06/14/2023   BMI 51.21 kg/m     Assessment:   1. Chlamydia   2. Vaginal discharge    Plan:   GC and pharyngeal chlamydia DNA  probe sent to lab Treatment: abstain from coitus during course of treatment ROV prn if symptoms persist or worsen.   Loney Laurence, CMA

## 2023-07-17 ENCOUNTER — Ambulatory Visit: Payer: Medicaid Other

## 2023-07-19 LAB — CT/GC NAA, PHARYNGEAL
C TRACH RRNA NPH QL PCR: NEGATIVE
N GONORRHOEA RRNA NPH QL PCR: NEGATIVE

## 2023-07-20 ENCOUNTER — Other Ambulatory Visit: Payer: Self-pay | Admitting: Obstetrics and Gynecology

## 2023-07-20 DIAGNOSIS — A599 Trichomoniasis, unspecified: Secondary | ICD-10-CM | POA: Insufficient documentation

## 2023-07-20 DIAGNOSIS — B3731 Acute candidiasis of vulva and vagina: Secondary | ICD-10-CM

## 2023-07-20 DIAGNOSIS — B9689 Other specified bacterial agents as the cause of diseases classified elsewhere: Secondary | ICD-10-CM

## 2023-07-20 LAB — CERVICOVAGINAL ANCILLARY ONLY
Bacterial Vaginitis (gardnerella): POSITIVE — AB
Candida Glabrata: NEGATIVE
Candida Vaginitis: POSITIVE — AB
Chlamydia: NEGATIVE
Comment: NEGATIVE
Comment: NEGATIVE
Comment: NEGATIVE
Comment: NEGATIVE
Comment: NEGATIVE
Comment: NORMAL
Neisseria Gonorrhea: NEGATIVE
Trichomonas: POSITIVE — AB

## 2023-07-20 MED ORDER — METRONIDAZOLE 500 MG PO TABS: 500.0000 mg | ORAL_TABLET | Freq: Two times a day (BID) | ORAL | 1 refills | Status: AC

## 2023-11-04 ENCOUNTER — Ambulatory Visit
Admission: EM | Admit: 2023-11-04 | Discharge: 2023-11-04 | Disposition: A | Discharge status: Home / Self Care | Attending: Family Medicine | Admitting: Family Medicine

## 2023-11-04 DIAGNOSIS — Z113 Encounter for screening for infections with a predominantly sexual mode of transmission: Secondary | ICD-10-CM | POA: Insufficient documentation

## 2023-11-04 NOTE — ED Triage Notes (Signed)
 Pt reports STD exposure, pt states she woke up to a message saying her partner had sx's of an STD. Denies any sx's currently. Pt would like both swab and blood work.

## 2023-11-05 LAB — CERVICOVAGINAL ANCILLARY ONLY
Chlamydia: NEGATIVE
Comment: NEGATIVE
Comment: NEGATIVE
Comment: NORMAL
Neisseria Gonorrhea: NEGATIVE
Trichomonas: NEGATIVE

## 2023-11-05 LAB — HIV ANTIBODY (ROUTINE TESTING W REFLEX): HIV Screen 4th Generation wRfx: NONREACTIVE

## 2023-11-05 LAB — RPR: RPR Ser Ql: NONREACTIVE
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
## 2023-12-23 ENCOUNTER — Ambulatory Visit: Payer: Self-pay

## 2023-12-23 NOTE — Telephone Encounter (Signed)
  Chief Complaint: menstrual like cramping Symptoms: positive pregnancy test, clear stringy vag discharge Frequency: 2-3 weeks  Pertinent Negatives: Patient denies fever, vag bleeding Disposition: [] ED /[x] Urgent Care (no appt availability in office) / [] Appointment(In office/virtual)/ []  [place] [facility]/ [] Home Care/ [] Refused Recommended Disposition /[] [place] [facility]/ []  Follow-up with PCP Additional Notes: no appts - called CAL and no appts  Copied from CRM #[id number]. Topic: Clinical - Red Word Triage >> Dec 23, 2023  4:08 PM Elle L wrote: Red Word that prompted transfer to Nurse Triage: The patient has a faint positive pregnancy test while on Nexplanon  and is experiencing cramping and fatigue. Reason for Disposition  Pregnancy suspected (e.g., missed last menstrual period)  Answer Assessment - Initial Assessment Questions 1. LOCATION: "Where does it hurt?"      Feels menstrual cramps  2. RADIATION: "Does the pain shoot anywhere else?" (e.g., chest, back)     no 3. ONSET: "When did the pain begin?" (e.g., minutes, hours or days ago)      2-3 weeks  4. SUDDEN: "Gradual or sudden onset?"     Gradual  5. PATTERN "Does the pain come and go, or is it constant?"    - If it comes and goes: "How long does it last?" "Do you have pain now?"     (Note: Comes and goes means the pain is intermittent. It goes away completely between bouts.)    - If constant: "Is it getting better, staying the same, or getting worse?"      (Note: Constant means the pain never goes away completely; most serious pain is constant and gets worse.)      Comes and goes 6. SEVERITY: "How bad is the pain?"  (e.g., Scale 1-10; mild, moderate, or severe)    - MILD (1-3): Doesn't interfere with normal activities, abdomen soft and not tender to touch.     - MODERATE (4-7): Interferes with normal activities or awakens from sleep, abdomen tender to touch.     - SEVERE (8-10): Excruciating pain, doubled over,  unable to do any normal activities.       moderate 7. RECURRENT SYMPTOM: "Have you ever had this type of stomach pain before?" If Yes, ask: "When was the last time?" and "What happened that time?"      *No Answer* 8. CAUSE: "What do you think is causing the stomach pain?"     Unsure   10. OTHER SYMPTOMS: "Do you have any other symptoms?" (e.g., back pain, diarrhea, fever, urination pain, vomiting)       no 11. PREGNANCY: "Is there any chance you are pregnant?" "When was your last menstrual period?"       Yes  Protocols used: Abdominal Pain - [facility]

## 2023-12-31 ENCOUNTER — Telehealth: Payer: Self-pay | Admitting: Family Medicine

## 2023-12-31 NOTE — Telephone Encounter (Signed)
 This RN called CAL to clarify if Nexplanon  could be removed in the office. This RN was instructed that this would need to be removed by the patient's OBGYN. This RN called patient and instructed patient to contact OBGYN for removal. Patient verbalized understanding.   Copied from CRM [id number]. Topic: Clinical - Medical Advice >> Dec 31, 2023  9:34 AM Elle L wrote: Reason for CRM: The patient is requesting to see if she has to have her Nexplanon  removed at the practice or if she can have it done at her OB/GYN.

## 2024-01-08 NOTE — Progress Notes (Unsigned)
     GYNECOLOGY OFFICE PROCEDURE NOTE  SUBJECTIVE Jodi Terrell is a 28 y.o. [phone] here for Nexplanon  removal.  This is her 3rd Nexplanon , was placed in 2023, but is causing her arm pain. Last pap smear was on 03/05/22 and was NILM.  No other gynecologic concerns. She denies hx of migraines with aura, VTE and does not smoke.   Nexplanon  Removal Patient identified, informed consent performed, consent signed.   Appropriate time out taken. Nexplanon  site identified.  Area prepped in usual sterile fashon. One ml of 1% lidocaine  was used to anesthetize the area at the distal end of the implant. A small stab incision was made right beside the implant on the distal portion.  The Nexplanon  rod was grasped using hemostats and removed without difficulty.  There was minimal blood loss. There were no complications.  3 ml of 1% lidocaine  was injected around the incision for post-procedure analgesia.  Steri-strips were applied over the small incision.  A pressure bandage was applied to reduce any bruising.  The patient tolerated the procedure well.   ASSESSMENT/PLAN  -Nexplanon  removal as per procedure note, uncomplicated, tolerated well. Pt aware of immediate return of fertility. -Pt has selected ___ as new form of contraception.  -Tylenol /Ibuprofen  prn for pain; aftercare instructions for incision reviewed.  -RTC if si/sx of infection, sooner prn  Pt able to perform insertion correctly in office, confirmed by exam afterward; no discomfort and pt has confidence in home insertion and removal on her own. All questions answered today. -Change Nuvaring on same day every month for period suppression. -Cardiovascular and other risks discussed, aware that smoking while on Nuvaring increases the risk of blood clots.  -SEs discussed, including BTB, N&V, weight changes, increased moodiness. If symptoms are severe, or mild SEs do not improve after 3 mos, call or RTC to discuss options.  -Refrain from intercourse  or use barrier contraception x 7 days after starting Nuvaring.  -Aware that Nuvaring protects against pregnancy only, not STDs. Higinio Love, CMA [facility]

## 2024-01-13 ENCOUNTER — Ambulatory Visit: Admitting: Obstetrics

## 2024-01-13 ENCOUNTER — Encounter: Payer: Self-pay | Admitting: Obstetrics

## 2024-01-13 VITALS — BP 117/54 | HR 84 | Ht 61.0 in | Wt 280.0 lb

## 2024-01-13 DIAGNOSIS — Z30015 Encounter for initial prescription of vaginal ring hormonal contraceptive: Secondary | ICD-10-CM

## 2024-01-13 DIAGNOSIS — Z3046 Encounter for surveillance of implantable subdermal contraceptive: Secondary | ICD-10-CM

## 2024-01-13 MED ORDER — ETONOGESTREL-ETHINYL ESTRADIOL 0.12-0.015 MG/24HR VA RING: VAGINAL_RING | VAGINAL | 4 refills | Status: AC

## 2024-01-28 ENCOUNTER — Ambulatory Visit: Admitting: Family Medicine

## 2024-02-09 ENCOUNTER — Ambulatory Visit: Admitting: Family Medicine

## 2024-03-07 ENCOUNTER — Ambulatory Visit (INDEPENDENT_AMBULATORY_CARE_PROVIDER_SITE_OTHER)

## 2024-03-07 VITALS — BP 114/69 | HR 78 | Resp 16 | Ht 61.0 in | Wt 281.0 lb

## 2024-03-07 DIAGNOSIS — Z3202 Encounter for pregnancy test, result negative: Secondary | ICD-10-CM | POA: Diagnosis not present

## 2024-03-07 DIAGNOSIS — N912 Amenorrhea, unspecified: Secondary | ICD-10-CM

## 2024-03-07 LAB — POCT URINE PREGNANCY: Preg Test, Ur: NEGATIVE

## 2024-03-07 NOTE — Progress Notes (Addendum)
    NURSE VISIT NOTE  Subjective:    Patient ID: Jodi Terrell, female    DOB: [birth year], 29 y.o.   MRN: [id number]  HPI  Patient is a 28 y.o. [phone] female who presents for evaluation of amenorrhea. She believes she could be pregnant. Pregnancy is desired. Sexual Activity: single partner, contraception: NuvaRing vaginal inserts. Current symptoms also include: breast tenderness, fatigue, morning sickness, nausea, and positive home pregnancy test x 7. Last period was normal. She has a history of ectopic pregnancies.    Objective:    Resp 16   Ht 5' 1 (1.549 m)   BMI 52.91 kg/m   Lab Review  Results for orders placed or performed in visit on 03/07/24  POCT urine pregnancy  Result Value Ref Range   Preg Test, Ur Negative Negative    Assessment:   1. Amenorrhea     Plan:   Pregnancy Test: @PLANEND @Negative   Patient would like to conceive and wishes to continue trying. Beta HCG was ordered. Patient stated she had 7 positive pregnancy test.   Camelia Fetters, CMA [facility] [place]

## 2024-03-08 LAB — BETA HCG QUANT (REF LAB): hCG Quant: <1 m[IU]/mL

## 2024-05-09 ENCOUNTER — Other Ambulatory Visit: Payer: Self-pay | Admitting: Medical Genetics

## 2024-05-11 ENCOUNTER — Other Ambulatory Visit (HOSPITAL_COMMUNITY)
Admission: RE | Admit: 2024-05-11 | Discharge: 2024-05-11 | Disposition: A | Discharge status: Home / Self Care | Payer: Self-pay | Source: Ambulatory Visit | Attending: Oncology | Admitting: Oncology

## 2024-05-20 LAB — GENECONNECT MOLECULAR SCREEN: Genetic Analysis Overall Interpretation: NEGATIVE

## 2024-08-23 ENCOUNTER — Ambulatory Visit: Admitting: Family Medicine

## 2024-08-23 ENCOUNTER — Encounter: Payer: Self-pay | Admitting: Family Medicine

## 2024-08-23 VITALS — BP 119/78 | HR 87 | Temp 98.2°F | Ht 61.0 in | Wt 283.6 lb

## 2024-08-23 DIAGNOSIS — Z131 Encounter for screening for diabetes mellitus: Secondary | ICD-10-CM | POA: Diagnosis not present

## 2024-08-23 DIAGNOSIS — Z13220 Encounter for screening for lipoid disorders: Secondary | ICD-10-CM

## 2024-08-23 DIAGNOSIS — Z0001 Encounter for general adult medical examination with abnormal findings: Secondary | ICD-10-CM

## 2024-08-23 DIAGNOSIS — Z113 Encounter for screening for infections with a predominantly sexual mode of transmission: Secondary | ICD-10-CM

## 2024-08-23 DIAGNOSIS — Z1321 Encounter for screening for nutritional disorder: Secondary | ICD-10-CM

## 2024-08-23 DIAGNOSIS — Z1329 Encounter for screening for other suspected endocrine disorder: Secondary | ICD-10-CM | POA: Diagnosis not present

## 2024-08-23 DIAGNOSIS — Z9189 Other specified personal risk factors, not elsewhere classified: Secondary | ICD-10-CM

## 2024-08-23 DIAGNOSIS — K5900 Constipation, unspecified: Secondary | ICD-10-CM | POA: Diagnosis not present

## 2024-08-23 DIAGNOSIS — Z0000 Encounter for general adult medical examination without abnormal findings: Secondary | ICD-10-CM | POA: Insufficient documentation

## 2024-08-23 DIAGNOSIS — Z6841 Body Mass Index (BMI) 40.0 and over, adult: Secondary | ICD-10-CM
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
## 2024-08-24 ENCOUNTER — Ambulatory Visit: Payer: Self-pay | Admitting: Family Medicine

## 2024-08-24 LAB — COMPREHENSIVE METABOLIC PANEL WITH GFR
AG Ratio: 1.7 (calc) (ref 1.0–2.5)
ALT: 15 U/L (ref 6–29)
AST: 15 U/L (ref 10–30)
Albumin: 4.3 g/dL (ref 3.6–5.1)
Alkaline phosphatase (APISO): 102 U/L (ref 31–125)
BUN: 10 mg/dL (ref 7–25)
CO2: 27 mmol/L (ref 20–32)
Calcium: 9.2 mg/dL (ref 8.6–10.2)
Chloride: 104 mmol/L (ref 98–110)
Creat: 0.57 mg/dL (ref 0.50–0.96)
Globulin: 2.5 g/dL (ref 1.9–3.7)
Glucose, Bld: 94 mg/dL (ref 65–99)
Potassium: 4.5 mmol/L (ref 3.5–5.3)
Sodium: 138 mmol/L (ref 135–146)
Total Bilirubin: 0.4 mg/dL (ref 0.2–1.2)
Total Protein: 6.8 g/dL (ref 6.1–8.1)
eGFR: 126 mL/min/1.73m2

## 2024-08-24 LAB — CBC WITH DIFFERENTIAL/PLATELET
Absolute Lymphocytes: 2921 {cells}/uL (ref 850–3900)
Absolute Monocytes: 674 {cells}/uL (ref 200–950)
Basophils Absolute: 43 {cells}/uL (ref 0–200)
Basophils Relative: 0.4 %
Eosinophils Absolute: 118 {cells}/uL (ref 15–500)
Eosinophils Relative: 1.1 %
HCT: 36.5 % (ref 35.9–46.0)
Hemoglobin: 12.1 g/dL (ref 11.7–15.5)
MCH: 28.5 pg (ref 27.0–33.0)
MCHC: 33.2 g/dL (ref 31.6–35.4)
MCV: 85.9 fL (ref 81.4–101.7)
MPV: 11.5 fL (ref 7.5–12.5)
Monocytes Relative: 6.3 %
Neutro Abs: 6944 {cells}/uL (ref 1500–7800)
Neutrophils Relative %: 64.9 %
Platelets: 310 Thousand/uL (ref 140–400)
RBC: 4.25 Million/uL (ref 3.80–5.10)
RDW: 12.8 % (ref 11.0–15.0)
Total Lymphocyte: 27.3 %
WBC: 10.7 Thousand/uL (ref 3.8–10.8)

## 2024-08-24 LAB — HIV ANTIBODY (ROUTINE TESTING W REFLEX)
HIV 1&2 Ab, 4th Generation: NONREACTIVE
HIV FINAL INTERPRETATION: NEGATIVE

## 2024-08-24 LAB — LIPID PANEL
Cholesterol: 184 mg/dL
HDL: 34 mg/dL — ABNORMAL LOW
LDL Cholesterol (Calc): 104 mg/dL — ABNORMAL HIGH
Non-HDL Cholesterol (Calc): 150 mg/dL — ABNORMAL HIGH
Total CHOL/HDL Ratio: 5.4 (calc) — ABNORMAL HIGH
Triglycerides: 346 mg/dL — ABNORMAL HIGH

## 2024-08-24 LAB — HEMOGLOBIN A1C
Hgb A1c MFr Bld: 5.3 %
Mean Plasma Glucose: 105 mg/dL
eAG (mmol/L): 5.8 mmol/L

## 2024-08-24 LAB — HEPATITIS C ANTIBODY: Hepatitis C Ab: NONREACTIVE

## 2024-08-24 LAB — SYPHILIS: RPR W/REFLEX TO RPR TITER AND TREPONEMAL ANTIBODIES, TRADITIONAL SCREENING AND DIAGNOSIS ALGORITHM: RPR Ser Ql: NONREACTIVE

## 2024-08-24 LAB — TSH: TSH: 3.18 m[IU]/L

## 2024-08-24 LAB — VITAMIN D 25 HYDROXY (VIT D DEFICIENCY, FRACTURES): Vit D, 25-Hydroxy: 21 ng/mL — ABNORMAL LOW (ref 30–100)

## 2024-08-25 LAB — C. TRACHOMATIS/N. GONORRHOEAE RNA
C. trachomatis RNA, TMA: NOT DETECTED
N. gonorrhoeae RNA, TMA: NOT DETECTED
